# Patient Record
Sex: Male | Born: 1990 | Race: Black or African American | Hispanic: No | Smoking: Never smoker
Health system: Southern US, Community
[De-identification: ages and names within clinical notes are randomized; demographics above are authoritative.]

## PROBLEM LIST (undated history)

## (undated) DIAGNOSIS — I10 Essential (primary) hypertension: Secondary | ICD-10-CM

## (undated) DIAGNOSIS — K219 Gastro-esophageal reflux disease without esophagitis: Secondary | ICD-10-CM

## (undated) HISTORY — PX: TONSILLECTOMY AND ADENOIDECTOMY: SUR1326

## (undated) HISTORY — PX: CHOLECYSTECTOMY: SHX55

---
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
## 2008-04-30 ENCOUNTER — Emergency Department (HOSPITAL_COMMUNITY): Admission: EM | Admit: 2008-04-30 | Discharge: 2008-04-30 | Payer: Self-pay | Admitting: Emergency Medicine

## 2008-05-03 ENCOUNTER — Ambulatory Visit (HOSPITAL_COMMUNITY): Admission: RE | Admit: 2008-05-03 | Discharge: 2008-05-03 | Payer: Self-pay | Admitting: Otolaryngology

## 2010-07-02 ENCOUNTER — Encounter: Payer: Self-pay | Admitting: Orthopedic Surgery

## 2010-07-08 ENCOUNTER — Emergency Department (HOSPITAL_COMMUNITY)
Admission: EM | Admit: 2010-07-08 | Discharge: 2010-07-08 | Payer: Self-pay | Source: Home / Self Care | Admitting: Emergency Medicine

## 2010-07-09 ENCOUNTER — Encounter: Payer: Self-pay | Admitting: Orthopedic Surgery

## 2010-07-09 ENCOUNTER — Ambulatory Visit
Admission: RE | Admit: 2010-07-09 | Discharge: 2010-07-09 | Payer: Self-pay | Source: Home / Self Care | Attending: Orthopedic Surgery | Admitting: Orthopedic Surgery

## 2010-07-09 DIAGNOSIS — S52133A Displaced fracture of neck of unspecified radius, initial encounter for closed fracture: Secondary | ICD-10-CM | POA: Insufficient documentation

## 2010-07-18 NOTE — Letter (Signed)
Summary: History form  History form   Imported By: Jacklynn Ganong 07/11/2010 14:40:39  _____________________________________________________________________  External Attachment:    Type:   Image     Comment:   External Document

## 2010-07-18 NOTE — Assessment & Plan Note (Signed)
Summary: AP ER FOL/UP/FX LT ELBOW/XRAY [facility] 07/08/09/SELF-PAY/ADVISED OF...  Chief complaint LEFT elbow pain.  History date of injury is January 23 of 2012.  Patient fell playing basketball. Also lost consciousness. Evaluation emergency room last night showed no postconcussive symptoms.  No complaints of sharp lateral elbow pain that is constant. Pain with any rotation of the forearm.  Pain is 8/10.  Review of systems is negative, except for the joint pain.  He is allergic to penicillin.  PSH  tonsil surgery tonsillectomies also had nasal surgery. PMH NEG  Primary care physician is [place], Civil engineer, contracting.  Currently, on no medications.  Family history is negative.  Social history she is single.  Does not work at this time.  Has no social habits.  Highest grade completed with the 9th grade.  Physical examination  Normal development, grooming, and hygiene are noted in this 20 year old male, who is oriented x3. Mood and affect are flat.  Gait, station are normal.  LEFT upper extremity is tender and swollen over the lateral elbow. He has pain with attempts to rotate the forearm. Elbow appears stable. Shrink and muscle tone are normal. Skin is intact.  Pulse and temperature are normal. He can open and close his hand move his thumb without difficulty.  He has no lymphadenopathy. On that side has no sensory deficits. Coordination and balance were also normal.  X-rays an x-ray report reveal a nondisplaced radial neck fracture.  Should be able to treat this with splinting. We applied a new posterior, splint. He'll come back in 2 weeks for x-rays and to start range of motion.  He is encouraged to get his pain medication refill, which is Percocet 5 mg. He has 20 tablets  [id number] FR CARE RAD HEAD FRAX [phone]   Signed by Fuller Canada MD on 07/09/2010 at 4:14 PM

## 2010-07-18 NOTE — Miscellaneous (Signed)
  Prescriptions: IBUPROFEN 800 MG TABS (IBUPROFEN) 1 by mouth q 8 hrs prn  #90 x 2   Entered and Authorized by:   Fuller Canada MD   Signed by:   Fuller Canada MD on 07/09/2010   Method used:   Faxed to ...       [facility] [address] [phone]* (retail)       [address] [place], [place]  [address]       Ph: [phone]       Fax: [phone]   RxID:   [id number]  Chief complaint LEFT elbow pain.  History date of injury is January 23 of 2012.  Patient fell playing basketball. Also lost consciousness. Evaluation emergency room last night showed no postconcussive symptoms.  No complaints of sharp lateral elbow pain that is constant. Pain with any rotation of the forearm.  Pain is 8/10.  Review of systems is negative, except for the joint pain.  He is allergic to penicillin.  PSH  tonsil surgery tonsillectomies also had nasal surgery. 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  Currently, on no medications.  Family history is negative.  Social history she is single.  Does not work at this time.  Has no social habits.  Highest grade completed with the 9th grade.  Physical examination  Normal development, grooming, and hygiene are noted in this 20 year old male, who is oriented x3. Mood and affect are flat.  Gait, station are normal.  LEFT upper extremity is tender and swollen over the lateral elbow. He has pain with attempts to rotate the forearm. Elbow appears stable. Shrink and muscle tone are normal. Skin is intact.  Pulse and temperature are normal. He can open and close his hand move his thumb without difficulty.  He has no lymphadenopathy. On that side has no sensory deficits. Coordination and balance were also normal.  X-rays an x-ray report reveal a nondisplaced radial neck fracture.  Should be able to treat this with splinting. We applied a new posterior, splint. He'll come back in 2 weeks for x-rays and to start range  of motion.  He is encouraged to get his pain medication refill, which is Percocet 5 mg. He has 20 tablets  [id number] FR CARE RAD HEAD FRAX [phone]

## 2010-07-23 ENCOUNTER — Encounter: Payer: Self-pay | Admitting: Orthopedic Surgery

## 2010-07-23 ENCOUNTER — Ambulatory Visit (INDEPENDENT_AMBULATORY_CARE_PROVIDER_SITE_OTHER): Payer: Self-pay | Admitting: Orthopedic Surgery

## 2010-07-23 DIAGNOSIS — S52133A Displaced fracture of neck of unspecified radius, initial encounter for closed fracture: Secondary | ICD-10-CM

## 2010-07-24 NOTE — Miscellaneous (Signed)
  Clinical Lists Changes  Orders: Added new Service order of New Patient Level III (99203) - Signed 

## 2010-08-01 NOTE — Assessment & Plan Note (Signed)
Summary: 2 WK RE-CK/XRAY LT ELBOW/SELF PAY VS.M.[facility] DISCOUNT/CAF   Visit Type:  Follow-up  CC:  left elbow fracture.  History of Present Illness: I saw Matthew Alvarez in the office today for a 2 week  followup visit.  He is a 20 years old man with the complaint of:  left elbow fracture  FR CARE RAD HEAD FRAX [phone]  date of injury is January 23 of 2012.  Medications: Ibuprofen 800 mg.  Xrays today.  Complaints: He still has some pain.     Physical Exam  Additional Exam:  currently he has flexion of 120, extension -30  Radiographs AP, lateral, elbow, show healed radial neck   Impression & Recommendations:  Problem # 1:  CLOSED FRACTURE OF NECK OF RADIUS (ICD-813.06) Assessment Improved  Orders: Post-Op Check (47829)  Patient Instructions: 1)  Start doing exercises that we showed you in the office for bending the elbow for  the next 2 weeks 2)  after 2 weeks add exercises of rotation of the hand 3)  come back as needed   Orders Added: 1)  Post-Op Check [99024]  Appended Document: 2 WK RE-CK/XRAY LT ELBOW/SELF PAY VS.M.[facility] DISCOUNT/CAF Separate and Identifiable X-Ray report      LEFT elbow.  Is a noted radial neck fracture has healed in good position.  No joint subluxation is noted.  Impression healed radial neck/head
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
## 2011-03-19 LAB — CBC
HCT: 42.4
Hemoglobin: 14.2
MCV: 84.8
RBC: 5
WBC: 10.7 — ABNORMAL HIGH

## 2012-09-22 ENCOUNTER — Encounter (HOSPITAL_COMMUNITY): Payer: Self-pay

## 2012-09-22 ENCOUNTER — Emergency Department (HOSPITAL_COMMUNITY): Payer: Self-pay

## 2012-09-22 ENCOUNTER — Emergency Department (HOSPITAL_COMMUNITY)
Admission: EM | Admit: 2012-09-22 | Discharge: 2012-09-22 | Disposition: A | Discharge status: Home / Self Care | Payer: Self-pay | Attending: Emergency Medicine | Admitting: Emergency Medicine

## 2012-09-22 DIAGNOSIS — S6390XA Sprain of unspecified part of unspecified wrist and hand, initial encounter: Secondary | ICD-10-CM | POA: Insufficient documentation

## 2012-09-22 DIAGNOSIS — Y9367 Activity, basketball: Secondary | ICD-10-CM | POA: Insufficient documentation

## 2012-09-22 DIAGNOSIS — X500XXA Overexertion from strenuous movement or load, initial encounter: Secondary | ICD-10-CM | POA: Insufficient documentation

## 2012-09-22 DIAGNOSIS — Y92838 Other recreation area as the place of occurrence of the external cause: Secondary | ICD-10-CM | POA: Insufficient documentation

## 2012-09-22 DIAGNOSIS — S63601A Unspecified sprain of right thumb, initial encounter: Secondary | ICD-10-CM

## 2012-09-22 DIAGNOSIS — Y9239 Other specified sports and athletic area as the place of occurrence of the external cause: Secondary | ICD-10-CM | POA: Insufficient documentation

## 2012-09-22 MED ORDER — IBUPROFEN 800 MG PO TABS: 800.0000 mg | ORAL_TABLET | Freq: Three times a day (TID) | ORAL | Status: DC | PRN

## 2012-09-22 MED ORDER — IBUPROFEN 800 MG PO TABS
800.0000 mg | ORAL_TABLET | Freq: Once | ORAL | Status: AC
  Administered 2012-09-22: 800 mg via ORAL
  Filled 2012-09-22: qty 1

## 2012-09-22 NOTE — ED Notes (Signed)
Instructions, prescriptions and f/u information given/reviewed.  Medicated for pain; velcro thumb spica applied Rt thumb; verbalizes understanding of splint care and d/c instructions.

## 2012-09-22 NOTE — ED Notes (Signed)
Playing ball and injured right thumb

## 2012-09-23 NOTE — ED Provider Notes (Signed)
Medical screening examination/treatment/procedure(s) were performed by non-physician practitioner and as supervising physician I was immediately available for consultation/collaboration.   Benny Lennert, MD 09/23/12 [phone]

## 2012-09-23 NOTE — ED Provider Notes (Signed)
History     CSN: [id number]  Arrival date & time 09/22/12  1555   First MD Initiated Contact with Patient 09/22/12 1659      Chief Complaint  Patient presents with  . Finger Injury    (Consider location/radiation/quality/duration/timing/severity/associated sxs/prior treatment) HPI Comments: Matthew Alvarez is a 22 y.o. right-handed Male presenting with pain in his right thumb since it was hyperextended during a basketball game yesterday.  He has been applying ice to the injured area and his swelling is improved but he continues to have pain with any attempts at range of motion.  There is no radiation of pain into his wrist or forearm.  And he has normal sensation in his fingertip.  There is no obvious deformity.  The history is provided by the patient.    History reviewed. No pertinent past medical history.  History reviewed. No pertinent past surgical history.  History reviewed. No pertinent family history.  History  Substance Use Topics  . Smoking status: Not on file  . Smokeless tobacco: Not on file  . Alcohol Use: Yes      Review of Systems  Constitutional: Negative for fever.  Musculoskeletal: Positive for joint swelling and arthralgias. Negative for myalgias.  Skin: Negative for color change and wound.  Neurological: Negative for weakness and numbness.    Allergies  Penicillins  Home Medications   Current Outpatient Rx  Name  Route  Sig  Dispense  Refill  . ibuprofen (ADVIL,MOTRIN) 800 MG tablet   Oral   Take 1 tablet (800 mg total) by mouth every 8 (eight) hours as needed for pain.   15 tablet   0     BP 137/71  Pulse 67  Temp(Src) 98.5 F (36.9 C) (Oral)  Resp 18  Ht 5\' 9"  (1.753 m)  Wt 198 lb 1 oz (89.841 kg)  BMI 29.24 kg/m2  SpO2 99%  Physical Exam  Constitutional: He appears well-developed and well-nourished.  HENT:  Head: Atraumatic.  Neck: Normal range of motion.  Cardiovascular:  Pulses equal bilaterally  Musculoskeletal: He  exhibits tenderness.  Patient is tender to palpation in his right thumb at the MCP joint which extends into his thenar eminence.  There is no obvious deformity, mild edema without erythema or ecchymosis.  He is less than 3 seconds distal capillary refill and his distal sensation is intact in the thumb.  MCP and PIP joints are without laxity.  Neurological: He is alert. He has normal strength. He displays normal reflexes. No sensory deficit.  Equal strength  Skin: Skin is warm and dry.  Psychiatric: He has a normal mood and affect.    ED Course  Procedures (including critical care time)  Labs Reviewed - No data to display Dg Finger Thumb Right  09/22/2012  *RADIOLOGY REPORT*  Clinical Data: Thumb injury 1 day prior  RIGHT THUMB 2+V  Comparison: None.  Findings:  No definite fracture or dislocation.  Joint spaces are preserved. No definite erosions.  Regional soft tissues are normal.  No radiopaque foreign body.  IMPRESSION: No fracture or dislocation.   Original Report Authenticated By: Tacey Ruiz, MD      1. Thumb sprain, right, initial encounter       MDM  Patients labs and/or radiological studies were viewed and considered during the medical decision making and disposition process.  Patient was placed in a thumb spica splint for comfort.  Encouraged to stop using the splint once his symptoms are better which I suspect  should occur over the next several days.  He was encouraged to continue using ice and elevation of the finger and is also prescribed ibuprofen 800 mg tablets.  He was given a referral to Dr. Romeo Apple when necessary if his symptoms are not improved over the next week.        Burgess Amor, PA-C 09/23/12 1617

## 2013-02-20 ENCOUNTER — Encounter (HOSPITAL_COMMUNITY): Payer: Self-pay

## 2013-02-20 ENCOUNTER — Emergency Department (HOSPITAL_COMMUNITY): Payer: Self-pay

## 2013-02-20 ENCOUNTER — Emergency Department (HOSPITAL_COMMUNITY)
Admission: EM | Admit: 2013-02-20 | Discharge: 2013-02-20 | Disposition: A | Discharge status: Home / Self Care | Payer: Self-pay | Attending: Emergency Medicine | Admitting: Emergency Medicine

## 2013-02-20 DIAGNOSIS — Y9367 Activity, basketball: Secondary | ICD-10-CM | POA: Insufficient documentation

## 2013-02-20 DIAGNOSIS — Y9239 Other specified sports and athletic area as the place of occurrence of the external cause: Secondary | ICD-10-CM | POA: Insufficient documentation

## 2013-02-20 DIAGNOSIS — S6390XA Sprain of unspecified part of unspecified wrist and hand, initial encounter: Secondary | ICD-10-CM | POA: Insufficient documentation

## 2013-02-20 DIAGNOSIS — Z880 Allergy status to penicillin: Secondary | ICD-10-CM | POA: Insufficient documentation

## 2013-02-20 DIAGNOSIS — S6391XA Sprain of unspecified part of right wrist and hand, initial encounter: Secondary | ICD-10-CM

## 2013-02-20 DIAGNOSIS — R209 Unspecified disturbances of skin sensation: Secondary | ICD-10-CM | POA: Insufficient documentation

## 2013-02-20 DIAGNOSIS — R296 Repeated falls: Secondary | ICD-10-CM | POA: Insufficient documentation

## 2013-02-20 MED ORDER — IBUPROFEN 800 MG PO TABS: 800.0000 mg | ORAL_TABLET | Freq: Three times a day (TID) | ORAL | Status: DC

## 2013-02-20 NOTE — ED Notes (Signed)
Pt reports injuring right hand 2 weeks ago while playing basketball. Cont. To have pain, no swelling or deformity noted.

## 2013-02-20 NOTE — ED Provider Notes (Signed)
CSN: [id number]     Arrival date & time 02/20/13  1729 History   First MD Initiated Contact with Patient 02/20/13 1755     Chief Complaint  Patient presents with  . Hand Pain   (Consider location/radiation/quality/duration/timing/severity/associated sxs/prior Treatment) HPI Comments: TRAYVEON BECKFORD is a 22 y.o. male who presents to the Emergency Department complaining of pain to his right lateral hand for 2 weeks. States pain began he fell on outstretched hand. States the pain is worse with palmar flexion and when someone shakes his hand. He also reports intermittent tingling to his fingers. He denies redness, swelling, numbness, wrist or elbow pain or pain to the medial side of his hand.  Patient is a 22 y.o. male presenting with hand pain. The history is provided by the patient.  Hand Pain This is a new problem. Episode onset: 2 weeks ago. The problem occurs constantly. The problem has been unchanged. Associated symptoms include arthralgias. Pertinent negatives include no chills, fever, headaches, joint swelling, myalgias, neck pain, numbness, rash, visual change or weakness. Exacerbated by: Movement and" shaking someone's hand" He has tried nothing for the symptoms. The treatment provided no relief.    History reviewed. No pertinent past medical history. History reviewed. No pertinent past surgical history. No family history on file. History  Substance Use Topics  . Smoking status: Never Smoker   . Smokeless tobacco: Not on file  . Alcohol Use: Yes    Review of Systems  Constitutional: Negative for fever and chills.  HENT: Negative for neck pain.   Genitourinary: Negative for dysuria and difficulty urinating.  Musculoskeletal: Positive for arthralgias. Negative for myalgias and joint swelling.  Skin: Negative for color change, rash and wound.  Neurological: Negative for weakness, numbness and headaches.  All other systems reviewed and are negative.    Allergies   Penicillins  Home Medications   Current Outpatient Rx  Name  Route  Sig  Dispense  Refill  . ibuprofen (ADVIL,MOTRIN) 800 MG tablet   Oral   Take 1 tablet (800 mg total) by mouth every 8 (eight) hours as needed for pain.   15 tablet   0   . ibuprofen (ADVIL,MOTRIN) 800 MG tablet   Oral   Take 1 tablet (800 mg total) by mouth 3 (three) times daily.   21 tablet   0    BP 134/51  Pulse 51  Temp(Src) 97.8 F (36.6 C) (Oral)  Resp 18  Ht 5\' 9"  (1.753 m)  Wt 196 lb 2 oz (88.962 kg)  BMI 28.95 kg/m2  SpO2 100% Physical Exam  Nursing note and vitals reviewed. Constitutional: He is oriented to person, place, and time. He appears well-developed and well-nourished. No distress.  HENT:  Head: Normocephalic and atraumatic.  Cardiovascular: Normal rate, regular rhythm and normal heart sounds.   No murmur heard. Pulmonary/Chest: Effort normal and breath sounds normal. No respiratory distress.  Musculoskeletal: He exhibits tenderness. He exhibits no edema.       Right hand: He exhibits tenderness. He exhibits normal range of motion, no bony tenderness, normal two-point discrimination, normal capillary refill, no deformity, no laceration and no swelling. Normal sensation noted. Normal strength noted.       Hands: Tenderness to palpation of the lateral right hand. Wrist is nontender. Patient has full range of motion of the fingers and wrist. Radial pulse is brisk, distal sensation intact.  CR< 2 sec.  No bruising, edema or bony deformity.  Patient has full ROM.   Neurological:  He is alert and oriented to person, place, and time. He exhibits normal muscle tone. Coordination normal.  Skin: Skin is warm and dry.    ED Course  Procedures (including critical care time) Labs Review Labs Reviewed - No data to display Imaging Review Dg Hand Complete Right  02/20/2013   *RADIOLOGY REPORT*  Clinical Data: Fifth metatarsal pain, fall  RIGHT HAND - COMPLETE 3+ VIEW  Comparison: None.  Findings:  There is no fracture or dislocation of the right carpal bones, metacarpals, or phalanges.  No soft tissue abnormality.  IMPRESSION: No fracture or dislocation.   Original Report Authenticated By: Genevive Bi, M.D.    MDM   1. Hand sprain, right, initial encounter    Velcro wrist splint applied, pain improved, remains NV intact.    Patient agrees to elevate ice and wear splint as directed. Referral information given for Dr. Hilda Lias for followup. X-ray findings were discussed. Patient appears stable for discharge at this time.    Irvine Glorioso L. Trisha Mangle, PA-C 02/20/13 1856

## 2013-02-22 NOTE — ED Provider Notes (Signed)
Medical screening examination/treatment/procedure(s) were performed by non-physician practitioner and as supervising physician I was immediately available for consultation/collaboration.   Laray Anger, DO 02/22/13 2151

## 2013-07-10 ENCOUNTER — Emergency Department (HOSPITAL_COMMUNITY): Payer: Self-pay

## 2013-07-10 ENCOUNTER — Encounter (HOSPITAL_COMMUNITY): Payer: Self-pay | Admitting: Emergency Medicine

## 2013-07-10 ENCOUNTER — Emergency Department (HOSPITAL_COMMUNITY)
Admission: EM | Admit: 2013-07-10 | Discharge: 2013-07-10 | Disposition: A | Discharge status: Home / Self Care | Payer: Self-pay | Attending: Emergency Medicine | Admitting: Emergency Medicine

## 2013-07-10 DIAGNOSIS — R059 Cough, unspecified: Secondary | ICD-10-CM | POA: Insufficient documentation

## 2013-07-10 DIAGNOSIS — I10 Essential (primary) hypertension: Secondary | ICD-10-CM | POA: Insufficient documentation

## 2013-07-10 DIAGNOSIS — Z880 Allergy status to penicillin: Secondary | ICD-10-CM | POA: Insufficient documentation

## 2013-07-10 DIAGNOSIS — R05 Cough: Secondary | ICD-10-CM | POA: Insufficient documentation

## 2013-07-10 DIAGNOSIS — E876 Hypokalemia: Secondary | ICD-10-CM | POA: Insufficient documentation

## 2013-07-10 HISTORY — DX: Essential (primary) hypertension: I10

## 2013-07-10 LAB — CBC WITH DIFFERENTIAL/PLATELET
BASOS PCT: 0 % (ref 0–1)
Basophils Absolute: 0 10*3/uL (ref 0.0–0.1)
Eosinophils Absolute: 0.2 10*3/uL (ref 0.0–0.7)
Eosinophils Relative: 3 % (ref 0–5)
HCT: 41.3 % (ref 39.0–52.0)
HEMOGLOBIN: 13.7 g/dL (ref 13.0–17.0)
LYMPHS ABS: 3.7 10*3/uL (ref 0.7–4.0)
Lymphocytes Relative: 43 % (ref 12–46)
MCH: 27.7 pg (ref 26.0–34.0)
MCHC: 33.2 g/dL (ref 30.0–36.0)
MCV: 83.4 fL (ref 78.0–100.0)
MONOS PCT: 6 % (ref 3–12)
Monocytes Absolute: 0.6 10*3/uL (ref 0.1–1.0)
NEUTROS ABS: 4.1 10*3/uL (ref 1.7–7.7)
NEUTROS PCT: 47 % (ref 43–77)
PLATELETS: 281 10*3/uL (ref 150–400)
RBC: 4.95 MIL/uL (ref 4.22–5.81)
RDW: 12.8 % (ref 11.5–15.5)
WBC: 8.6 10*3/uL (ref 4.0–10.5)

## 2013-07-10 LAB — COMPREHENSIVE METABOLIC PANEL
ALBUMIN: 3.8 g/dL (ref 3.5–5.2)
ALK PHOS: 120 U/L — AB (ref 39–117)
ALT: 18 U/L (ref 0–53)
AST: 26 U/L (ref 0–37)
BILIRUBIN TOTAL: 0.2 mg/dL — AB (ref 0.3–1.2)
BUN: 15 mg/dL (ref 6–23)
CHLORIDE: 97 meq/L (ref 96–112)
CO2: 27 mEq/L (ref 19–32)
Calcium: 9.4 mg/dL (ref 8.4–10.5)
Creatinine, Ser: 0.98 mg/dL (ref 0.50–1.35)
GFR calc Af Amer: >90 mL/min (ref >90)
GFR calc non Af Amer: >90 mL/min (ref >90)
Glucose, Bld: 89 mg/dL (ref 70–99)
POTASSIUM: 3.3 meq/L — AB (ref 3.7–5.3)
SODIUM: 136 meq/L — AB (ref 137–147)
TOTAL PROTEIN: 8 g/dL (ref 6.0–8.3)

## 2013-07-10 LAB — TROPONIN I

## 2013-07-10 MED ORDER — POTASSIUM CHLORIDE CRYS ER 10 MEQ PO TBCR: 20.0000 meq | EXTENDED_RELEASE_TABLET | Freq: Every day | ORAL | Status: DC

## 2013-07-10 MED ORDER — POTASSIUM CHLORIDE CRYS ER 20 MEQ PO TBCR
40.0000 meq | EXTENDED_RELEASE_TABLET | Freq: Once | ORAL | Status: AC
  Administered 2013-07-10: 40 meq via ORAL
  Filled 2013-07-10: qty 2

## 2013-07-10 NOTE — ED Notes (Signed)
Pt c/o muscle "cramps" that started this am, states that the cramps will happen at different times involving both arms, hip and leg area. Pt denies n/v/d, pt also reports that he has been having intermittent chest pains for "months", has [facility] for the pain,

## 2013-07-10 NOTE — Discharge Instructions (Signed)
Follow up with your md in one week 

## 2013-07-10 NOTE — ED Provider Notes (Signed)
CSN: [id number]     Arrival date & time 07/10/13  1344 History   First MD Initiated Contact with Patient 07/10/13 1626     This chart was scribed for Benny LennertJoseph L Elyon Zoll, MD by Arlan OrganAshley Leger, ED Scribe. This patient was seen in room APA10/APA10 and the patient's care was started 4:31 PM.   Chief Complaint  Patient presents with  . Spasms  . Chest Pain   Patient is a 23 y.o. male presenting with chest pain. The history is provided by the patient. No language interpreter was used.  Chest Pain Pain radiates to:  Does not radiate Pain radiates to the back: no   Pain severity:  Mild Onset quality:  Gradual Timing:  Intermittent Relieved by:  None tried Worsened by:  Nothing tried Ineffective treatments:  None tried Associated symptoms: cough     HPI Comments: Matthew Alvarez is a 23 y.o. male who presents to the Emergency Department complaining of sudden onset, ongoing, intermittent bilateral muscle spasms to the arms that initially started early this morning. He states movement does not worsen his pain. He has also noted intermittent chest pain that he states started "months" ago. He reports being evaluated at the health department for his pain. Pt admits to a productive cough consisting of green sputum onset 2 days. Denies any aggravating or alleviating factors. He denies any fever or SOB at this time. He denies currently being a smoker.  He is followed by Dr. Elfredia NevinsLawrence Fusco  Past Medical History  Diagnosis Date  . Hypertension    Past Surgical History  Procedure Laterality Date  . Tonsillectomy and adenoidectomy     No family history on file. History  Substance Use Topics  . Smoking status: Never Smoker   . Smokeless tobacco: Not on file  . Alcohol Use: Yes    Review of Systems  Respiratory: Positive for cough.   Cardiovascular: Positive for chest pain.  Musculoskeletal:       Muscle spasms to the arms bilaterally  All other systems reviewed and are negative.    Allergies   Penicillins  Home Medications  No current outpatient prescriptions on file.  Triage Vitals: BP 132/49  Pulse 52  Temp(Src) 97.8 F (36.6 C) (Oral)  Resp 20  SpO2 100%  Physical Exam  Nursing note and vitals reviewed. Constitutional: He is oriented to person, place, and time. He appears well-developed and well-nourished.  HENT:  Head: Normocephalic.  Eyes: EOM are normal.  Neck: Normal range of motion.  Cardiovascular: Normal rate and regular rhythm.   Pulmonary/Chest: Effort normal.  Abdominal: He exhibits no distension.  Musculoskeletal: Normal range of motion.  Neurological: He is alert and oriented to person, place, and time.  Psychiatric: He has a normal mood and affect.    ED Course  Procedures (including critical care time)  DIAGNOSTIC STUDIES: Oxygen Saturation is 100% on RA, Normal by my interpretation.    COORDINATION OF CARE: 4:29 PM- Will order chest X-Ray, EKG, troponin I, and blood panel. Discussed treatment plan with pt at bedside and pt agreed to plan.     6:27 PM- Discussed low potassium results from blood panel results. Advised pt to take in more potassium daily to avoid deficiency.  Labs Review Labs Reviewed - No data to display Imaging Review No results found.  EKG Interpretation   None       MDM  The chart was scribed for me under my direct supervision.  I personally performed the history, physical,  and medical decision making and all procedures in the evaluation of this patient.Benny Lennert, MD 07/10/13 [phone]

## 2013-12-12 ENCOUNTER — Encounter (HOSPITAL_COMMUNITY): Payer: Self-pay | Admitting: Emergency Medicine

## 2013-12-12 ENCOUNTER — Emergency Department (HOSPITAL_COMMUNITY): Payer: Self-pay

## 2013-12-12 ENCOUNTER — Emergency Department (HOSPITAL_COMMUNITY)
Admission: EM | Admit: 2013-12-12 | Discharge: 2013-12-13 | Disposition: A | Discharge status: Home / Self Care | Payer: Self-pay | Attending: Emergency Medicine | Admitting: Emergency Medicine

## 2013-12-12 DIAGNOSIS — R0789 Other chest pain: Secondary | ICD-10-CM | POA: Insufficient documentation

## 2013-12-12 DIAGNOSIS — Z79899 Other long term (current) drug therapy: Secondary | ICD-10-CM | POA: Insufficient documentation

## 2013-12-12 DIAGNOSIS — Z880 Allergy status to penicillin: Secondary | ICD-10-CM | POA: Insufficient documentation

## 2013-12-12 DIAGNOSIS — Z7982 Long term (current) use of aspirin: Secondary | ICD-10-CM | POA: Insufficient documentation

## 2013-12-12 DIAGNOSIS — I10 Essential (primary) hypertension: Secondary | ICD-10-CM | POA: Insufficient documentation

## 2013-12-12 LAB — CBC WITH DIFFERENTIAL/PLATELET
Basophils Absolute: 0 10*3/uL (ref 0.0–0.1)
Basophils Relative: 0 % (ref 0–1)
Eosinophils Absolute: 0.2 10*3/uL (ref 0.0–0.7)
Eosinophils Relative: 3 % (ref 0–5)
HCT: 40.8 % (ref 39.0–52.0)
Hemoglobin: 14.1 g/dL (ref 13.0–17.0)
Lymphocytes Relative: 51 % — ABNORMAL HIGH (ref 12–46)
Lymphs Abs: 4.8 10*3/uL — ABNORMAL HIGH (ref 0.7–4.0)
MCH: 28.4 pg (ref 26.0–34.0)
MCHC: 34.6 g/dL (ref 30.0–36.0)
MCV: 82.3 fL (ref 78.0–100.0)
Monocytes Absolute: 0.5 10*3/uL (ref 0.1–1.0)
Monocytes Relative: 6 % (ref 3–12)
Neutro Abs: 3.6 10*3/uL (ref 1.7–7.7)
Neutrophils Relative %: 40 % — ABNORMAL LOW (ref 43–77)
Platelets: 261 10*3/uL (ref 150–400)
RBC: 4.96 MIL/uL (ref 4.22–5.81)
RDW: 13.3 % (ref 11.5–15.5)
WBC: 9.2 10*3/uL (ref 4.0–10.5)

## 2013-12-12 LAB — BASIC METABOLIC PANEL
BUN: 10 mg/dL (ref 6–23)
CO2: 32 mEq/L (ref 19–32)
Calcium: 9 mg/dL (ref 8.4–10.5)
Chloride: 96 mEq/L (ref 96–112)
Creatinine, Ser: 1.12 mg/dL (ref 0.50–1.35)
GFR calc Af Amer: >90 mL/min (ref >90)
GFR calc non Af Amer: >90 mL/min (ref >90)
Glucose, Bld: 86 mg/dL (ref 70–99)
Potassium: 3.2 mEq/L — ABNORMAL LOW (ref 3.7–5.3)
Sodium: 138 mEq/L (ref 137–147)

## 2013-12-12 MED ORDER — POTASSIUM CHLORIDE CRYS ER 20 MEQ PO TBCR
40.0000 meq | EXTENDED_RELEASE_TABLET | Freq: Once | ORAL | Status: AC
  Administered 2013-12-13: 40 meq via ORAL
  Filled 2013-12-12: qty 2

## 2013-12-12 NOTE — Discharge Instructions (Signed)

## 2013-12-12 NOTE — ED Notes (Signed)
Performed EKG upon arrival to room 14; handed to Dr Rosalia Hammersay

## 2013-12-12 NOTE — ED Notes (Signed)
Feels sob for 3 days, no cough,  No fever or chills.    No sore throat., "pressure on my chest"

## 2013-12-12 NOTE — ED Provider Notes (Signed)
CSN: [id number]     Arrival date & time 12/12/13  2151 History  This chart was scribed for Raeford RazorStephen Kohut, MD,  by Ashley JacobsBrittany Andrews, ED Scribe. The patient was seen in room APA14/APA14 and the patient's care was started at 11:07 PM.   First MD Initiated Contact with Patient 12/12/13 2302     Chief Complaint  Patient presents with  . Shortness of Breath     (Consider location/radiation/quality/duration/timing/severity/associated sxs/prior Treatment) Patient is a 23 y.o. male presenting with shortness of breath. The history is provided by the patient and medical records. No language interpreter was used.  Shortness of Breath Severity:  Mild Onset quality:  Sudden Duration:  3 days Timing:  Intermittent Progression:  Unchanged Worsened by:  Nothing tried Ineffective treatments:  None tried Associated symptoms: no abdominal pain, no cough, no fever and no vomiting    HPI Comments: Matthew Alvarez is a 23 y.o. male with ,hx of HTN,who presents to the Emergency Department complaining of intermittent chest tightness for the past three days that remains unchanged. Nothing causes the pain and it comes as sudden,  spontaneous onset.  The tightness feels like pressure.  He has associated SOB and explains it as a "fist in my chest". The chest tightness is better with walking and the tightness returns when he sits. He had a similar sensation when his potassium was low. Denies nausea, abdominal pain, and vomiting. Denies sore throat, fever and chills.  Denies leg swelling. Denies visual changes.   Past Medical History  Diagnosis Date  . Hypertension    Past Surgical History  Procedure Laterality Date  . Tonsillectomy and adenoidectomy     History reviewed. No pertinent family history. History  Substance Use Topics  . Smoking status: Never Smoker   . Smokeless tobacco: Not on file  . Alcohol Use: Yes    Review of Systems  Constitutional: Negative for fever and chills.  Eyes: Negative for  visual disturbance.  Respiratory: Positive for chest tightness and shortness of breath. Negative for cough.   Gastrointestinal: Negative for nausea, vomiting and abdominal pain.  All other systems reviewed and are negative.     Allergies  Penicillins  Home Medications   Prior to Admission medications   Medication Sig Start Date End Date Taking? Authorizing Provider  ASPIRIN PO Take 1 tablet by mouth once as needed (for relief).   Yes Historical Provider, MD  aspirin-sod bicarb-citric acid (ALKA-SELTZER) 325 MG TBEF tablet Take 650 mg by mouth once as needed (for relief).   Yes Historical Provider, MD  Bisacodyl (LAXATIVE PO) Take 1-2 tablets by mouth once as needed (for relief).   Yes Historical Provider, MD  hydrochlorothiazide (HYDRODIURIL) 25 MG tablet Take 25 mg by mouth daily.   Yes Historical Provider, MD  potassium chloride SA (K-DUR,KLOR-CON) 10 MEQ tablet Take 2 tablets (20 mEq total) by mouth daily. 07/10/13  Yes Benny LennertJoseph L Zammit, MD   BP 130/72  Pulse 56  Temp(Src) 98.4 F (36.9 C) (Oral)  Resp 17  Ht 5\' 9"  (1.753 m)  Wt 198 lb (89.812 kg)  BMI 29.23 kg/m2  SpO2 100% Physical Exam  Nursing note and vitals reviewed. Constitutional: He is oriented to person, place, and time. He appears well-developed and well-nourished.  HENT:  Head: Normocephalic.  Eyes: Pupils are equal, round, and reactive to light.  Neck: Normal range of motion.  Cardiovascular: Normal rate.   Pulmonary/Chest: Effort normal and breath sounds normal. He has no wheezes. He has no  rales.  Abdominal: Soft. Bowel sounds are normal. He exhibits no distension. There is no tenderness.  Musculoskeletal: Normal range of motion. He exhibits no edema.  No calf tenderness bilaterally    Neurological: He is alert and oriented to person, place, and time.  Skin: Skin is warm and dry. He is not diaphoretic.  Psychiatric: He has a normal mood and affect. His behavior is normal.    ED Course  Procedures  (including critical care time) DIAGNOSTIC STUDIES: Oxygen Saturation is 100% on room air, normal by my interpretation.    COORDINATION OF CARE:  11:10 PM Discussed course of care with pt which includes EKG, chest x-ray and laboratory test. Pt understands and agrees.   Labs Review Labs Reviewed  BASIC METABOLIC PANEL - Abnormal; Notable for the following:    Potassium 3.2 (*)    All other components within normal limits  CBC WITH DIFFERENTIAL - Abnormal; Notable for the following:    Neutrophils Relative % 40 (*)    Lymphocytes Relative 51 (*)    Lymphs Abs 4.8 (*)    All other components within normal limits    Imaging Review Dg Chest 2 View  12/12/2013   CLINICAL DATA:  Chest pain  EXAM: CHEST  2 VIEW  COMPARISON:  None  FINDINGS: The heart size and mediastinal contours are within normal limits. Both lungs are clear. The visualized skeletal structures are unremarkable.  IMPRESSION: No active cardiopulmonary disease.   Electronically Signed   By: Signa Kellaylor  Stroud M.D.   On: 12/12/2013 23:19     EKG Interpretation   Date/Time:  Monday December 12 2013 22:05:10 EDT Ventricular Rate:  51 PR Interval:  201 QRS Duration: 97 QT Interval:  408 QTC Calculation: 376 R Axis:   46 Text Interpretation:  Sinus rhythm RSR' in V1 or V2, probably normal  variant No significant change since last tracing Confirmed by KOHUT  MD,  STEPHEN ([id number]) on 12/14/2013 7:22:35 AM      EKG:  Rhythm: sinus bradycardia Vent. rate 51 BPM PR interval 201 ms QRS duration 97 ms QT/QTc 408/376 ms ST segments: NS ST changes   MDM   Final diagnoses:  Chest tightness or pressure    22yM with CP. HD stable. Lungs clear. No increased WOB. CXR clear. Low suspicion for ACS, PE, infectious, dissection or other serious pathology.  I personally preformed the services scribed in my presence. The recorded information has been reviewed is accurate. Raeford RazorStephen Kohut, MD.     Raeford RazorStephen Kohut, MD 12/14/13 [phone]

## 2014-01-05 ENCOUNTER — Emergency Department (HOSPITAL_COMMUNITY)
Admission: EM | Admit: 2014-01-05 | Discharge: 2014-01-05 | Disposition: A | Discharge status: Home / Self Care | Payer: Self-pay | Attending: Emergency Medicine | Admitting: Emergency Medicine

## 2014-01-05 ENCOUNTER — Encounter (HOSPITAL_COMMUNITY): Payer: Self-pay | Admitting: Emergency Medicine

## 2014-01-05 ENCOUNTER — Emergency Department (HOSPITAL_COMMUNITY): Payer: Self-pay

## 2014-01-05 DIAGNOSIS — X500XXA Overexertion from strenuous movement or load, initial encounter: Secondary | ICD-10-CM | POA: Insufficient documentation

## 2014-01-05 DIAGNOSIS — Z880 Allergy status to penicillin: Secondary | ICD-10-CM | POA: Insufficient documentation

## 2014-01-05 DIAGNOSIS — I10 Essential (primary) hypertension: Secondary | ICD-10-CM | POA: Insufficient documentation

## 2014-01-05 DIAGNOSIS — Y92838 Other recreation area as the place of occurrence of the external cause: Secondary | ICD-10-CM

## 2014-01-05 DIAGNOSIS — Y9367 Activity, basketball: Secondary | ICD-10-CM | POA: Insufficient documentation

## 2014-01-05 DIAGNOSIS — Y9239 Other specified sports and athletic area as the place of occurrence of the external cause: Secondary | ICD-10-CM | POA: Insufficient documentation

## 2014-01-05 DIAGNOSIS — S93402A Sprain of unspecified ligament of left ankle, initial encounter: Secondary | ICD-10-CM

## 2014-01-05 DIAGNOSIS — S93409A Sprain of unspecified ligament of unspecified ankle, initial encounter: Secondary | ICD-10-CM | POA: Insufficient documentation

## 2014-01-05 MED ORDER — HYDROCODONE-ACETAMINOPHEN 5-325 MG PO TABS: ORAL_TABLET | ORAL | Status: DC

## 2014-01-05 MED ORDER — IBUPROFEN 800 MG PO TABS: 800.0000 mg | ORAL_TABLET | Freq: Three times a day (TID) | ORAL | Status: DC

## 2014-01-05 MED ORDER — HYDROCODONE-ACETAMINOPHEN 5-325 MG PO TABS
1.0000 | ORAL_TABLET | Freq: Once | ORAL | Status: AC
  Administered 2014-01-05: 1 via ORAL
  Filled 2014-01-05: qty 1

## 2014-01-05 MED ORDER — IBUPROFEN 800 MG PO TABS
800.0000 mg | ORAL_TABLET | Freq: Once | ORAL | Status: AC
  Administered 2014-01-05: 800 mg via ORAL
  Filled 2014-01-05: qty 1

## 2014-01-05 NOTE — ED Provider Notes (Signed)
CSN: [id number]     Arrival date & time 01/05/14  2028 History   First MD Initiated Contact with Patient 01/05/14 2044     Chief Complaint  Patient presents with  . Ankle Injury     (Consider location/radiation/quality/duration/timing/severity/associated sxs/prior Treatment)  Aubry L Somerville is a 23 y.o. male who presents to the Emergency Department complaining of left ankle pain and swelling after a twisting injury while playing basketball.  He reports immediate swelling to the lateral ankle.  Pain is worse with weight bearing and improves with rest.  He denies injury above the ankle.  He has not tried any therapies or medication prior to ED arrival.    Patient is a 23 y.o. male presenting with lower extremity injury. The history is provided by the patient.  Ankle Injury This is a new problem. The current episode started today. The problem occurs constantly. The problem has been unchanged. Associated symptoms include arthralgias and joint swelling. Pertinent negatives include no chills, fever, neck pain, numbness, rash, vomiting or weakness. The symptoms are aggravated by bending, standing and walking. He has tried nothing for the symptoms. The treatment provided no relief.    Past Medical History  Diagnosis Date  . Hypertension    Past Surgical History  Procedure Laterality Date  . Tonsillectomy and adenoidectomy     History reviewed. No pertinent family history. History  Substance Use Topics  . Smoking status: Never Smoker   . Smokeless tobacco: Not on file  . Alcohol Use: No    Review of Systems  Constitutional: Negative for fever and chills.  Gastrointestinal: Negative for vomiting.  Genitourinary: Negative for dysuria and difficulty urinating.  Musculoskeletal: Positive for arthralgias and joint swelling. Negative for back pain and neck pain.  Skin: Negative for color change, rash and wound.  Neurological: Negative for weakness and numbness.  All other systems reviewed  and are negative.     Allergies  Penicillins  Home Medications   Prior to Admission medications   Medication Sig Start Date End Date Taking? Authorizing Provider  HYDROcodone-acetaminophen (NORCO/VICODIN) 5-325 MG per tablet Take one-two tabs po q 4-6 hrs prn pain 01/05/14   Selso Mannor L. Saadiq Poche, PA-C  ibuprofen (ADVIL,MOTRIN) 800 MG tablet Take 1 tablet (800 mg total) by mouth 3 (three) times daily. 01/05/14   Wen Munford L. Sheletha Bow, PA-C   BP 133/62  Pulse 69  Temp(Src) 98 F (36.7 C) (Oral)  Resp 18  Ht 5\' 9"  (1.753 m)  Wt 204 lb (92.534 kg)  BMI 30.11 kg/m2  SpO2 100% Physical Exam  Nursing note and vitals reviewed. Constitutional: He is oriented to person, place, and time. He appears well-developed and well-nourished. No distress.  HENT:  Head: Normocephalic and atraumatic.  Cardiovascular: Normal rate, regular rhythm, normal heart sounds and intact distal pulses.   No murmur heard. Pulmonary/Chest: Effort normal and breath sounds normal. No respiratory distress.  Musculoskeletal: He exhibits edema and tenderness.  Left lateral ankle is ttp, moderate STS is present.  ROM is preserved.  DP pulse is brisk,distal sensation intact.  No erythema, abrasion, bruising or bony deformity.  No proximal tenderness.  Neurological: He is alert and oriented to person, place, and time. He exhibits normal muscle tone. Coordination normal.  Skin: Skin is warm and dry.    ED Course  Procedures (including critical care time) Labs Review Labs Reviewed - No data to display  Imaging Review Dg Ankle Complete Left  01/05/2014   CLINICAL DATA:  Ankle pain and swelling  following injury.  EXAM: LEFT ANKLE COMPLETE - 3+ VIEW  COMPARISON:  None.  FINDINGS: The mineralization and alignment are normal. There is no evidence of acute fracture or dislocation. The joint spaces are preserved. There is prominent anterolateral soft tissue swelling without apparent large ankle joint effusion.  IMPRESSION: No acute  osseous findings.  Anterolateral soft tissue swelling.   Electronically Signed   By: Roxy HorsemanBill  Veazey M.D.   On: 01/05/2014 21:06     EKG Interpretation None      MDM   Final diagnoses:  Ankle sprain, left, initial encounter    Pt is well appearing.  XR results discussed.  He agrees to symptomatic treatment with RICE therapy, ibuprofen and vicodin for pain. I have advised him of possible occult fx given the degree of swelling to the joint and he agrees to arrange orthopedic follow-up in one week if the symptoms are not improving.   ASO splint applied, crutches given.  Pain improved, remains NV intact   Amonie Wisser L. Trisha Mangleriplett, PA-C 01/05/14 2207

## 2014-01-05 NOTE — ED Notes (Signed)
Patient states he was playing ball and twisted his left ankle. Complaining of pain in left ankle.

## 2014-01-05 NOTE — ED Notes (Signed)
Pt given crutches and instructed on how to use; pt able to demonstrate correct way to use without difficulty

## 2014-01-05 NOTE — Discharge Instructions (Signed)
Ankle Sprain  An ankle sprain is an injury to the strong, fibrous tissues (ligaments) that hold your ankle bones together.   HOME CARE   · Put ice on your ankle for 1-2 days or as told by your doctor.  ¨ Put ice in a plastic bag.  ¨ Place a towel between your skin and the bag.  ¨ Leave the ice on for 15-20 minutes at a time, every 2 hours while you are awake.  · Only take medicine as told by your doctor.  · Raise (elevate) your injured ankle above the level of your heart as much as possible for 2-3 days.  · Use crutches if your doctor tells you to. Slowly put your own weight on the affected ankle. Use the crutches until you can walk without pain.  · If you have a plaster splint:  ¨ Do not rest it on anything harder than a pillow for 24 hours.  ¨ Do not put weight on it.  ¨ Do not get it wet.  ¨ Take it off to shower or bathe.  · If given, use an elastic wrap or support stocking for support. Take the wrap off if your toes lose feeling (numb), tingle, or turn cold or blue.  · If you have an air splint:  ¨ Add or let out air to make it comfortable.  ¨ Take it off at night and to shower and bathe.  ¨ Wiggle your toes and move your ankle up and down often while you are wearing it.  GET HELP IF:  · You have rapidly increasing bruising or puffiness (swelling).  · Your toes feel very cold.  · You lose feeling in your foot.  · Your medicine does not help your pain.  GET HELP RIGHT AWAY IF:   · Your toes lose feeling (numb) or turn blue.  · You have severe pain that is increasing.  MAKE SURE YOU:   · Understand these instructions.  · Will watch your condition.  · Will get help right away if you are not doing well or get worse.  Document Released: 11/19/2007 Document Revised: 10/17/2013 Document Reviewed: 12/15/2011  ExitCare® Patient Information ©2015 [employer]. This information is not intended to replace advice given to you by your health care provider. Make sure you discuss any questions you have with your health care  provider.

## 2014-01-07 NOTE — ED Provider Notes (Signed)
Medical screening examination/treatment/procedure(s) were performed by non-physician practitioner and as supervising physician I was immediately available for consultation/collaboration.   EKG Interpretation None       Cypress Hinkson, MD 01/07/14 0704 

## 2014-03-29 ENCOUNTER — Emergency Department (HOSPITAL_COMMUNITY)
Admission: EM | Admit: 2014-03-29 | Discharge: 2014-03-29 | Disposition: A | Discharge status: Home / Self Care | Payer: Self-pay | Attending: Emergency Medicine | Admitting: Emergency Medicine

## 2014-03-29 ENCOUNTER — Encounter (HOSPITAL_COMMUNITY): Payer: Self-pay | Admitting: Emergency Medicine

## 2014-03-29 DIAGNOSIS — Z792 Long term (current) use of antibiotics: Secondary | ICD-10-CM | POA: Insufficient documentation

## 2014-03-29 DIAGNOSIS — L03115 Cellulitis of right lower limb: Secondary | ICD-10-CM | POA: Insufficient documentation

## 2014-03-29 DIAGNOSIS — L03114 Cellulitis of left upper limb: Secondary | ICD-10-CM | POA: Insufficient documentation

## 2014-03-29 DIAGNOSIS — L0390 Cellulitis, unspecified: Secondary | ICD-10-CM

## 2014-03-29 DIAGNOSIS — L0291 Cutaneous abscess, unspecified: Secondary | ICD-10-CM

## 2014-03-29 DIAGNOSIS — I10 Essential (primary) hypertension: Secondary | ICD-10-CM | POA: Insufficient documentation

## 2014-03-29 DIAGNOSIS — L02414 Cutaneous abscess of left upper limb: Secondary | ICD-10-CM | POA: Insufficient documentation

## 2014-03-29 DIAGNOSIS — Z880 Allergy status to penicillin: Secondary | ICD-10-CM | POA: Insufficient documentation

## 2014-03-29 DIAGNOSIS — Z79899 Other long term (current) drug therapy: Secondary | ICD-10-CM | POA: Insufficient documentation

## 2014-03-29 MED ORDER — CEPHALEXIN 500 MG PO CAPS: 500.0000 mg | ORAL_CAPSULE | Freq: Four times a day (QID) | ORAL | Status: DC

## 2014-03-29 MED ORDER — SULFAMETHOXAZOLE-TRIMETHOPRIM 800-160 MG PO TABS
1.0000 | ORAL_TABLET | Freq: Two times a day (BID) | ORAL | Status: AC
Start: 2014-03-29 — End: 2014-04-05

## 2014-03-29 NOTE — ED Provider Notes (Signed)
CSN: [id number]     Arrival date & time 03/29/14  0043 History   First MD Initiated Contact with Patient 03/29/14 0133     Chief Complaint  Patient presents with  . Abscess     (Consider location/radiation/quality/duration/timing/severity/associated sxs/prior Treatment) HPI Comments: Patient presents with swollen areas to the left arm and right leg.  No injury or trauma.  No fevers or chills.  Patient is a 23 y.o. male presenting with abscess. The history is provided by the patient.  Abscess Abscess location: left forearm and right leg. Abscess quality: induration, painful and redness   Red streaking: no   Duration:  2 days Progression:  Worsening Pain details:    Severity:  Mild   Timing:  Constant   Progression:  Worsening Chronicity:  New Context: not diabetes   Relieved by:  Nothing Worsened by:  Nothing tried Ineffective treatments:  None tried   Past Medical History  Diagnosis Date  . Hypertension    Past Surgical History  Procedure Laterality Date  . Tonsillectomy and adenoidectomy     History reviewed. No pertinent family history. History  Substance Use Topics  . Smoking status: Never Smoker   . Smokeless tobacco: Not on file  . Alcohol Use: No    Review of Systems  All other systems reviewed and are negative.     Allergies  Penicillins  Home Medications   Prior to Admission medications   Medication Sig Start Date End Date Taking? Authorizing Provider  cephALEXin (KEFLEX) 500 MG capsule Take 1 capsule (500 mg total) by mouth 4 (four) times daily. 03/29/14   Geoffery Lyonsouglas Clyde Upshaw, MD  HYDROcodone-acetaminophen (NORCO/VICODIN) 5-325 MG per tablet Take one-two tabs po q 4-6 hrs prn pain 01/05/14   Tammy L. Triplett, PA-C  ibuprofen (ADVIL,MOTRIN) 800 MG tablet Take 1 tablet (800 mg total) by mouth 3 (three) times daily. 01/05/14   Tammy L. Triplett, PA-C  sulfamethoxazole-trimethoprim (BACTRIM DS,SEPTRA DS) 800-160 MG per tablet Take 1 tablet by mouth 2 (two)  times daily. 03/29/14 04/05/14  Geoffery Lyonsouglas Jacklyne Baik, MD   BP 146/74  Pulse 60  Temp(Src) 97.7 F (36.5 C) (Oral)  Resp 16  SpO2 100% Physical Exam  Nursing note and vitals reviewed. Constitutional: He is oriented to person, place, and time. He appears well-developed and well-nourished. No distress.  HENT:  Head: Normocephalic and atraumatic.  Neck: Normal range of motion. Neck supple.  Neurological: He is alert and oriented to person, place, and time.  Skin: He is not diaphoretic.  There are two swollen, firm areas to the left forearm and one to the right leg with mild erythema.  There is no fluctuance.      ED Course  Procedures (including critical care time) Labs Review Labs Reviewed - No data to display  Imaging Review No results found.   EKG Interpretation None      MDM   Final diagnoses:  Cellulitis and abscess    These appear to be areas of cellulitis, possible early abscess without fluctuance or a target for I and D.  Will treat with keflex and bactrim.  PRN return.    Geoffery Lyonsouglas Kenzley Ke, MD 03/29/14 [phone]

## 2014-03-29 NOTE — Discharge Instructions (Signed)
Keflex and Bactrim as prescribed.  Warm soaks to affected areas as frequently as possible for the next several days.  Return to the emergency department if your swelling worsens, you develop fever, or for other new and concerning symptoms.   Cellulitis Cellulitis is an infection of the skin and the tissue beneath it. The infected area is usually red and tender. Cellulitis occurs most often in the arms and lower legs.  CAUSES  Cellulitis is caused by bacteria that enter the skin through cracks or cuts in the skin. The most common types of bacteria that cause cellulitis are staphylococci and streptococci. SIGNS AND SYMPTOMS   Redness and warmth.  Swelling.  Tenderness or pain.  Fever. DIAGNOSIS  Your health care provider can usually determine what is wrong based on a physical exam. Blood tests may also be done. TREATMENT  Treatment usually involves taking an antibiotic medicine. HOME CARE INSTRUCTIONS   Take your antibiotic medicine as directed by your health care provider. Finish the antibiotic even if you start to feel better.  Keep the infected arm or leg elevated to reduce swelling.  Apply a warm cloth to the affected area up to 4 times per day to relieve pain.  Take medicines only as directed by your health care provider.  Keep all follow-up visits as directed by your health care provider. SEEK MEDICAL CARE IF:   You notice red streaks coming from the infected area.  Your red area gets larger or turns dark in color.  Your bone or joint underneath the infected area becomes painful after the skin has healed.  Your infection returns in the same area or another area.  You notice a swollen bump in the infected area.  You develop new symptoms.  You have a fever. SEEK IMMEDIATE MEDICAL CARE IF:   You feel very sleepy.  You develop vomiting or diarrhea.  You have a general ill feeling (malaise) with muscle aches and pains. MAKE SURE YOU:   Understand these  instructions.  Will watch your condition.  Will get help right away if you are not doing well or get worse. Document Released: 03/12/2005 Document Revised: 10/17/2013 Document Reviewed: 08/18/2011 [facility] [place] Patient Information 2015 [place]. This information is not intended to replace advice given to you by your health care provider. Make sure you discuss any questions you have with your health care provider.

## 2014-03-29 NOTE — ED Notes (Signed)
Pt c/o two abscess to left forearm and one to the rt leg.

## 2014-04-06 ENCOUNTER — Encounter (HOSPITAL_COMMUNITY): Payer: Self-pay | Admitting: Emergency Medicine

## 2014-04-06 ENCOUNTER — Emergency Department (HOSPITAL_COMMUNITY)
Admission: EM | Admit: 2014-04-06 | Discharge: 2014-04-06 | Disposition: A | Discharge status: Home / Self Care | Payer: Self-pay | Attending: Emergency Medicine | Admitting: Emergency Medicine

## 2014-04-06 DIAGNOSIS — Z880 Allergy status to penicillin: Secondary | ICD-10-CM | POA: Insufficient documentation

## 2014-04-06 DIAGNOSIS — Z791 Long term (current) use of non-steroidal anti-inflammatories (NSAID): Secondary | ICD-10-CM | POA: Insufficient documentation

## 2014-04-06 DIAGNOSIS — R21 Rash and other nonspecific skin eruption: Secondary | ICD-10-CM | POA: Insufficient documentation

## 2014-04-06 DIAGNOSIS — Z792 Long term (current) use of antibiotics: Secondary | ICD-10-CM | POA: Insufficient documentation

## 2014-04-06 DIAGNOSIS — I10 Essential (primary) hypertension: Secondary | ICD-10-CM | POA: Insufficient documentation

## 2014-04-06 NOTE — ED Provider Notes (Signed)
CSN: [id number]     Arrival date & time 04/06/14  1623 History   First MD Initiated Contact with Patient 04/06/14 1638     Chief Complaint  Patient presents with  . Rash     (Consider location/radiation/quality/duration/timing/severity/associated sxs/prior Treatment) Patient is a 23 y.o. male presenting with rash. The history is provided by the patient.  Rash Location: multiple areas. Quality: itchiness and redness   Quality: not bruising, not draining, not painful, not scaling and not weeping   Severity:  Moderate Onset quality:  Gradual Duration:  2 days Progression:  Worsening Chronicity:  New Context: medications and new detergent/soap   Relieved by:  Nothing Ineffective treatments:  Anti-itch cream Associated symptoms: no abdominal pain, no diarrhea, no hoarse voice, no joint pain, no shortness of breath, no throat swelling, no tongue swelling and not wheezing     Past Medical History  Diagnosis Date  . Hypertension    Past Surgical History  Procedure Laterality Date  . Tonsillectomy and adenoidectomy     History reviewed. No pertinent family history. History  Substance Use Topics  . Smoking status: Never Smoker   . Smokeless tobacco: Not on file  . Alcohol Use: No    Review of Systems  Constitutional: Negative for activity change.       All ROS Neg except as noted in HPI  HENT: Negative for hoarse voice.   Eyes: Negative for photophobia and discharge.  Respiratory: Negative for cough, shortness of breath and wheezing.   Cardiovascular: Negative for chest pain and palpitations.  Gastrointestinal: Negative for abdominal pain, diarrhea and blood in stool.  Genitourinary: Negative for dysuria, frequency and hematuria.  Musculoskeletal: Negative for arthralgias, back pain and neck pain.  Skin: Positive for rash.  Neurological: Negative for dizziness, seizures and speech difficulty.  Psychiatric/Behavioral: Negative for hallucinations and confusion.       Allergies  Penicillins  Home Medications   Prior to Admission medications   Medication Sig Start Date End Date Taking? Authorizing Provider  cephALEXin (KEFLEX) 500 MG capsule Take 1 capsule (500 mg total) by mouth 4 (four) times daily. 03/29/14   Geoffery Lyonsouglas Delo, MD  HYDROcodone-acetaminophen (NORCO/VICODIN) 5-325 MG per tablet Take one-two tabs po q 4-6 hrs prn pain 01/05/14   Tammy L. Triplett, PA-C  ibuprofen (ADVIL,MOTRIN) 800 MG tablet Take 1 tablet (800 mg total) by mouth 3 (three) times daily. 01/05/14   Tammy L. Triplett, PA-C   BP 126/73  Pulse 62  Temp(Src) 98.6 F (37 C) (Oral)  Resp 20  Ht 5\' 9"  (1.753 m)  Wt 220 lb (99.791 kg)  BMI 32.47 kg/m2  SpO2 97% Physical Exam  Nursing note and vitals reviewed. Constitutional: He is oriented to person, place, and time. He appears well-developed and well-nourished.  Non-toxic appearance.  HENT:  Head: Normocephalic.  Right Ear: Tympanic membrane and external ear normal.  Left Ear: Tympanic membrane and external ear normal.  Eyes: EOM and lids are normal. Pupils are equal, round, and reactive to light.  Neck: Normal range of motion. Neck supple. Carotid bruit is not present.  Cardiovascular: Normal rate, regular rhythm, normal heart sounds, intact distal pulses and normal pulses.   Pulmonary/Chest: Breath sounds normal. No respiratory distress. He has no wheezes. He has no rales.  Abdominal: Soft. Bowel sounds are normal. There is no tenderness. There is no guarding.  Musculoskeletal: Normal range of motion.  Lymphadenopathy:       Head (right side): No submandibular adenopathy present.  Head (left side): No submandibular adenopathy present.    He has no cervical adenopathy.  Neurological: He is alert and oriented to person, place, and time. He has normal strength. No cranial nerve deficit or sensory deficit.  Skin: Skin is warm and dry. Rash noted.  Group of red papular rash noted on the right and left neck, both  arms and chest.  Psychiatric: He has a normal mood and affect. His speech is normal.    ED Course  Procedures (including critical care time) Labs Review Labs Reviewed - No data to display  Imaging Review No results found.   EKG Interpretation None      MDM  Discussed with the patient that this rash may be viral, from the sulfur medication, the new soap he is using or other source. He gets relief from the OTC cream. He will use benadryl for additional  Help with itching. Pt to stop the sulfur medication. He will return if any changes. Pt referred to an allergist.   Final diagnoses:  Rash    *I have reviewed nursing notes, vital signs, and all appropriate lab and imaging results for this patient.Kathie Dike**    Trixy Loyola M Makinzey Banes, PA-C 04/06/14 1712

## 2014-04-06 NOTE — ED Notes (Addendum)
Was seen here one weeks ago for an abscess.  Rash times 2 days.  Is on septra and keflex

## 2014-04-06 NOTE — Discharge Instructions (Signed)
Continue your over the counter cream. Stop the sulfur medications. Use benadryl for itching. Make a list of any new foods, soap, clothing, detergent, etc.. Return if any changes or problem. Rash A rash is a change in the color or feel of your skin. There are many different types of rashes. You may have other problems along with your rash. HOME CARE  Avoid the thing that caused your rash.  Do not scratch your rash.  You may take cools baths to help stop itching.  Only take medicines as told by your doctor.  Keep all doctor visits as told. GET HELP RIGHT AWAY IF:   Your pain, puffiness (swelling), or redness gets worse.  You have a fever.  You have new or severe problems.  You have body aches, watery poop (diarrhea), or you throw up (vomit).  Your rash is not better after 3 days. MAKE SURE YOU:   Understand these instructions.  Will watch your condition.  Will get help right away if you are not doing well or get worse. Document Released: 11/19/2007 Document Revised: 08/25/2011 Document Reviewed: 03/17/2011 [facility] Patient Information 2015 [place], [place]. This information is not intended to replace advice given to you by your health care provider. Make sure you discuss any questions you have with your health care provider.

## 2014-04-06 NOTE — ED Provider Notes (Signed)
Medical screening examination/treatment/procedure(s) were performed by non-physician practitioner and as supervising physician I was immediately available for consultation/collaboration.   EKG Interpretation None        Gilda Creasehristopher J. Pollina, MD 04/06/14 [phone]

## 2014-08-20 ENCOUNTER — Encounter (HOSPITAL_COMMUNITY): Payer: Self-pay

## 2014-08-20 ENCOUNTER — Emergency Department (HOSPITAL_COMMUNITY)
Admission: EM | Admit: 2014-08-20 | Discharge: 2014-08-21 | Disposition: A | Discharge status: Home / Self Care | Payer: Medicaid Other | Attending: Emergency Medicine | Admitting: Emergency Medicine

## 2014-08-20 ENCOUNTER — Emergency Department (HOSPITAL_COMMUNITY): Payer: Medicaid Other

## 2014-08-20 DIAGNOSIS — R1013 Epigastric pain: Secondary | ICD-10-CM | POA: Insufficient documentation

## 2014-08-20 DIAGNOSIS — Z880 Allergy status to penicillin: Secondary | ICD-10-CM | POA: Diagnosis not present

## 2014-08-20 DIAGNOSIS — I10 Essential (primary) hypertension: Secondary | ICD-10-CM | POA: Insufficient documentation

## 2014-08-20 DIAGNOSIS — Z792 Long term (current) use of antibiotics: Secondary | ICD-10-CM | POA: Insufficient documentation

## 2014-08-20 DIAGNOSIS — R109 Unspecified abdominal pain: Secondary | ICD-10-CM | POA: Diagnosis present

## 2014-08-20 DIAGNOSIS — R1010 Upper abdominal pain, unspecified: Secondary | ICD-10-CM

## 2014-08-20 NOTE — ED Notes (Signed)
Generalized upper abd pain for one week, no relief with OTC meds. Denies vomiting or diarrhea.

## 2014-08-20 NOTE — ED Provider Notes (Signed)
CSN: [id number]     Arrival date & time 08/20/14  2212 History  This chart was scribed for Matthew Co, MD by Tonye Royalty, ED Scribe. This patient was seen in room APA08/APA08 and the patient's care was started at 11:35 PM.    Chief Complaint  Patient presents with  . Abdominal Pain   The history is provided by the patient. No language interpreter was used.    HPI Comments: Matthew Alvarez is a 24 y.o. male who presents to the Emergency Department complaining of abdominal pain with onset 1 week ago. He locates it to his epigastric area when laying down and in mid abdomen when sitting up. He states symptoms are persistent since onset but have not worsened. He notes he exercises often but denies any significant changes. He states pain seems to sometimes be worse with food, depending on what he eats. He states he has used some "gas pills" with some improvement. He denies nausea, vomiting, diarrhea, blood in stool, dark stool, fever, constipation, urinary changes, back pain, flank pain, or SOB.  PCP: Cassell Smiles., MD   Past Medical History  Diagnosis Date  . Hypertension    Past Surgical History  Procedure Laterality Date  . Tonsillectomy and adenoidectomy     No family history on file. History  Substance Use Topics  . Smoking status: Never Smoker   . Smokeless tobacco: Not on file  . Alcohol Use: No    Review of Systems A complete 10 system review of systems was obtained and all systems are negative except as noted in the HPI and PMH.    Allergies  Penicillins  Home Medications   Prior to Admission medications   Medication Sig Start Date End Date Taking? Authorizing Provider  Ranitidine HCl (ACID REDUCER PO) Take 1-2 tablets by mouth once as needed (for stomach upset).   Yes Historical Provider, MD  cephALEXin (KEFLEX) 500 MG capsule Take 1 capsule (500 mg total) by mouth 4 (four) times daily. 03/29/14   Geoffery Lyons, MD  HYDROcodone-acetaminophen (NORCO/VICODIN) 5-325 MG  per tablet Take one-two tabs po q 4-6 hrs prn pain Patient not taking: Reported on 08/20/2014 01/05/14   Tammy L. Triplett, PA-C  ibuprofen (ADVIL,MOTRIN) 800 MG tablet Take 1 tablet (800 mg total) by mouth 3 (three) times daily. Patient not taking: Reported on 08/20/2014 01/05/14   Tammy L. Triplett, PA-C   BP 123/73 mmHg  Pulse 59  Temp(Src) 98.9 F (37.2 C) (Oral)  Resp 20  Ht  (1.753 m)  Wt 215 lb (97.523 kg)  BMI 31.74 kg/m2  SpO2 100% Physical Exam  Constitutional: He is oriented to person, place, and time. He appears well-developed and well-nourished.  HENT:  Head: Normocephalic and atraumatic.  Eyes: EOM are normal.  Neck: Normal range of motion.  Cardiovascular: Normal rate, regular rhythm, normal heart sounds and intact distal pulses.   Pulmonary/Chest: Effort normal and breath sounds normal. No respiratory distress.  Abdominal: Soft. He exhibits no distension. There is no tenderness.  Musculoskeletal: Normal range of motion.  Neurological: He is alert and oriented to person, place, and time.  Skin: Skin is warm and dry.  Psychiatric: He has a normal mood and affect. Judgment normal.  Nursing note and vitals reviewed.   ED Course  Procedures (including critical care time)  DIAGNOSTIC STUDIES: Oxygen Saturation is 100% on room air, normal by my interpretation.    COORDINATION OF CARE: 11:39 PM Discussed treatment plan with patient at beside, including  abdominal x-ray. If x-ray is good, will prescribe Prilosec and refer to GI for follow up. The patient agrees with the plan and has no further questions at this time.   Labs Review Labs Reviewed - No data to display  Imaging Review No results found.   EKG Interpretation None      MDM   Final diagnoses:  Upper abdominal pain    Patient is overall well-appearing.  His vital signs are normal.  I suspect this is developing gastritis given his discomfort.  He'll be placed on twice a day Prilosec.  Outpatient  PCP and GI follow-up.  Doubt pancreatitis.  Denies nausea vomiting or diarrhea.  No fevers or chills.  Doubt cholelithiasis.  I personally performed the services described in this documentation, which was scribed in my presence. The recorded information has been reviewed and is accurate.      Matthew CoKevin M Laurey Salser, MD 08/21/14 [phone]

## 2014-08-21 MED ORDER — FAMOTIDINE 20 MG PO TABS
ORAL_TABLET | ORAL | Status: AC
  Administered 2014-08-21: 20 mg
  Filled 2014-08-21: qty 1

## 2014-08-21 MED ORDER — OMEPRAZOLE 20 MG PO CPDR: 20.0000 mg | DELAYED_RELEASE_CAPSULE | Freq: Two times a day (BID) | ORAL | Status: DC

## 2014-08-21 NOTE — Discharge Instructions (Signed)

## 2015-08-02 ENCOUNTER — Emergency Department (HOSPITAL_COMMUNITY): Payer: Medicaid Other

## 2015-08-02 ENCOUNTER — Encounter (HOSPITAL_COMMUNITY): Payer: Self-pay | Admitting: Emergency Medicine

## 2015-08-02 ENCOUNTER — Emergency Department (HOSPITAL_COMMUNITY)
Admission: EM | Admit: 2015-08-02 | Discharge: 2015-08-02 | Disposition: A | Discharge status: Home / Self Care | Payer: Medicaid Other | Attending: Emergency Medicine | Admitting: Emergency Medicine

## 2015-08-02 DIAGNOSIS — I10 Essential (primary) hypertension: Secondary | ICD-10-CM | POA: Diagnosis not present

## 2015-08-02 DIAGNOSIS — R1084 Generalized abdominal pain: Secondary | ICD-10-CM | POA: Diagnosis not present

## 2015-08-02 DIAGNOSIS — R531 Weakness: Secondary | ICD-10-CM | POA: Insufficient documentation

## 2015-08-02 DIAGNOSIS — R197 Diarrhea, unspecified: Secondary | ICD-10-CM | POA: Insufficient documentation

## 2015-08-02 DIAGNOSIS — Z880 Allergy status to penicillin: Secondary | ICD-10-CM | POA: Diagnosis not present

## 2015-08-02 DIAGNOSIS — J3489 Other specified disorders of nose and nasal sinuses: Secondary | ICD-10-CM | POA: Diagnosis not present

## 2015-08-02 DIAGNOSIS — R1013 Epigastric pain: Secondary | ICD-10-CM | POA: Diagnosis present

## 2015-08-02 DIAGNOSIS — R112 Nausea with vomiting, unspecified: Secondary | ICD-10-CM

## 2015-08-02 DIAGNOSIS — G479 Sleep disorder, unspecified: Secondary | ICD-10-CM | POA: Diagnosis not present

## 2015-08-02 LAB — CBC WITH DIFFERENTIAL/PLATELET
BASOS ABS: 0 10*3/uL (ref 0.0–0.1)
BASOS PCT: 0 %
Eosinophils Absolute: 0.1 10*3/uL (ref 0.0–0.7)
Eosinophils Relative: 1 %
HEMATOCRIT: 39.2 % (ref 39.0–52.0)
HEMOGLOBIN: 12.9 g/dL — AB (ref 13.0–17.0)
Lymphocytes Relative: 18 %
Lymphs Abs: 2.7 10*3/uL (ref 0.7–4.0)
MCH: 27.7 pg (ref 26.0–34.0)
MCHC: 32.9 g/dL (ref 30.0–36.0)
MCV: 84.1 fL (ref 78.0–100.0)
Monocytes Absolute: 0.7 10*3/uL (ref 0.1–1.0)
Monocytes Relative: 5 %
NEUTROS ABS: 11.1 10*3/uL — AB (ref 1.7–7.7)
NEUTROS PCT: 76 %
Platelets: 258 10*3/uL (ref 150–400)
RBC: 4.66 MIL/uL (ref 4.22–5.81)
RDW: 13.1 % (ref 11.5–15.5)
WBC: 14.6 10*3/uL — AB (ref 4.0–10.5)

## 2015-08-02 LAB — URINE MICROSCOPIC-ADD ON
Bacteria, UA: NONE SEEN
Squamous Epithelial / LPF: NONE SEEN
WBC, UA: NONE SEEN WBC/hpf (ref 0–5)

## 2015-08-02 LAB — RAPID URINE DRUG SCREEN, HOSP PERFORMED
AMPHETAMINES: NOT DETECTED
BARBITURATES: NOT DETECTED
BENZODIAZEPINES: NOT DETECTED
COCAINE: NOT DETECTED
Opiates: NOT DETECTED
TETRAHYDROCANNABINOL: NOT DETECTED

## 2015-08-02 LAB — URINALYSIS, ROUTINE W REFLEX MICROSCOPIC
Bilirubin Urine: NEGATIVE
Glucose, UA: NEGATIVE mg/dL
KETONES UR: NEGATIVE mg/dL
Leukocytes, UA: NEGATIVE
Nitrite: NEGATIVE
PROTEIN: NEGATIVE mg/dL
Specific Gravity, Urine: >1.03 — ABNORMAL HIGH (ref 1.005–1.030)
pH: 5 (ref 5.0–8.0)

## 2015-08-02 LAB — COMPREHENSIVE METABOLIC PANEL
ALT: 149 U/L — ABNORMAL HIGH (ref 17–63)
ANION GAP: 7 (ref 5–15)
AST: 244 U/L — ABNORMAL HIGH (ref 15–41)
Albumin: 4 g/dL (ref 3.5–5.0)
Alkaline Phosphatase: 113 U/L (ref 38–126)
BUN: 17 mg/dL (ref 6–20)
CALCIUM: 8.3 mg/dL — AB (ref 8.9–10.3)
CO2: 28 mmol/L (ref 22–32)
Chloride: 103 mmol/L (ref 101–111)
Creatinine, Ser: 0.94 mg/dL (ref 0.61–1.24)
GFR calc non Af Amer: >60 mL/min (ref >60)
Glucose, Bld: 119 mg/dL — ABNORMAL HIGH (ref 65–99)
Potassium: 3.2 mmol/L — ABNORMAL LOW (ref 3.5–5.1)
SODIUM: 138 mmol/L (ref 135–145)
TOTAL PROTEIN: 7.2 g/dL (ref 6.5–8.1)
Total Bilirubin: 0.6 mg/dL (ref 0.3–1.2)

## 2015-08-02 LAB — LIPASE, BLOOD: Lipase: 26 U/L (ref 11–51)

## 2015-08-02 MED ORDER — DIATRIZOATE MEGLUMINE & SODIUM 66-10 % PO SOLN
ORAL | Status: AC
  Administered 2015-08-02: 30 mL
  Filled 2015-08-02: qty 30

## 2015-08-02 MED ORDER — ONDANSETRON HCL 4 MG/2ML IJ SOLN
4.0000 mg | Freq: Once | INTRAMUSCULAR | Status: AC
  Administered 2015-08-02: 4 mg via INTRAVENOUS
  Filled 2015-08-02: qty 2

## 2015-08-02 MED ORDER — SODIUM CHLORIDE 0.9 % IV SOLN: INTRAVENOUS | Status: DC

## 2015-08-02 MED ORDER — IOHEXOL 300 MG/ML  SOLN
100.0000 mL | Freq: Once | INTRAMUSCULAR | Status: AC | PRN
  Administered 2015-08-02: 100 mL via INTRAVENOUS

## 2015-08-02 MED ORDER — ONDANSETRON HCL 8 MG PO TABS: 8.0000 mg | ORAL_TABLET | Freq: Three times a day (TID) | ORAL | Status: DC | PRN

## 2015-08-02 MED ORDER — HYDROMORPHONE HCL 1 MG/ML IJ SOLN
1.0000 mg | Freq: Once | INTRAMUSCULAR | Status: AC
  Administered 2015-08-02: 1 mg via INTRAVENOUS
  Filled 2015-08-02: qty 1

## 2015-08-02 MED ORDER — SODIUM CHLORIDE 0.9 % IV BOLUS (SEPSIS)
1000.0000 mL | Freq: Once | INTRAVENOUS | Status: AC
  Administered 2015-08-02: 1000 mL via INTRAVENOUS

## 2015-08-02 NOTE — ED Provider Notes (Signed)
CSN: [id number]     Arrival date & time 08/02/15  1191 History  By signing my name below, I, Matthew Alvarez, attest that this documentation has been prepared under the direction and in the presence of Matthew Bale, MD. Electronically Signed: Murriel Alvarez, ED Scribe. 08/02/2015. 8:36 AM.    Chief Complaint  Patient presents with  . Abdominal Pain      Patient is a 25 y.o. male presenting with abdominal pain. The history is provided by the patient. No language interpreter was used.  Abdominal Pain Associated symptoms: diarrhea   Associated symptoms: no cough and no fever    HPI Comments: Matthew Alvarez is a 25 y.o. male who presents to the Emergency Department complaining of intermittent, worsening epigastric abdominal pain that has been present since yesterday while pt was at work. Pt states he originally thought the pain was gas, and continued working throughout the day. 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 Pt reports having trouble going to sleep last night due to pain. Pt states he then woke up early this morning, and had one very watery stool in addition to two episodes of vomiting. Pt reports having associated weakness and rhinorrhea as well. Pt denies fever, cough.   Past Medical History  Diagnosis Date  . Hypertension    Past Surgical History  Procedure Laterality Date  . Tonsillectomy and adenoidectomy     History reviewed. No pertinent family history. Social History  Substance Use Topics  . Smoking status: Never Smoker   . Smokeless tobacco: None  . Alcohol Use: No    Review of Systems  Constitutional: Negative for fever.  HENT: Positive for rhinorrhea.   Respiratory: Negative for cough.   Gastrointestinal: Positive for abdominal pain and diarrhea.  Neurological: Positive for weakness.  Psychiatric/Behavioral: Positive for sleep disturbance.  All other systems reviewed and are negative.     Allergies  Penicillins  Home Medications    Prior to Admission medications   Medication Sig Start Date End Date Taking? Authorizing Provider  Chlorphen-PE-Acetaminophen (ALLERGY MULTI-SYMPTOM DAYTIME PO) Take 1 tablet by mouth.   Yes Historical Provider, MD  sodium chloride (OCEAN) 0.65 % SOLN nasal spray Place 1 spray into both nostrils as needed for congestion.   Yes Historical Provider, MD  omeprazole (PRILOSEC) 20 MG capsule Take 1 capsule (20 mg total) by mouth 2 (two) times daily before a meal. Patient not taking: Reported on 08/02/2015 08/21/14   Azalia Bilis, MD  ondansetron (ZOFRAN) 8 MG tablet Take 1 tablet (8 mg total) by mouth every 8 (eight) hours as needed for nausea or vomiting. 08/02/15   Matthew Bale, MD  Ranitidine HCl (ACID REDUCER PO) Take 1-2 tablets by mouth once as needed (for stomach upset). Reported on 08/02/2015    Historical Provider, MD   BP 127/63 mmHg  Pulse 52  Temp(Src) 98.4 F (36.9 C) (Oral)  Resp 16  Ht  (1.753 m)  Wt 215 lb (97.523 kg)  BMI 31.74 kg/m2  SpO2 100% Physical Exam  Constitutional: He is oriented to person, place, and time. He appears well-developed and well-nourished.  HENT:  Head: Normocephalic and atraumatic.  Right Ear: External ear normal.  Left Ear: External ear normal.  Eyes: Conjunctivae and EOM are normal. Pupils are equal, round, and reactive to light.  Neck: Normal range of motion and phonation normal. Neck supple.  Cardiovascular: Normal rate, regular rhythm and normal heart sounds.   Pulmonary/Chest: Effort normal and breath sounds  normal. He exhibits no bony tenderness.  Abdominal: Soft. There is no tenderness.  Bowel sounds hypoactive Mild diffuse tenderness and guarding, but no rebound tenderness throughout abdomen  Musculoskeletal: Normal range of motion.  Neurological: He is alert and oriented to person, place, and time. No cranial nerve deficit or sensory deficit. He exhibits normal muscle tone. Coordination normal.  Skin: Skin is warm, dry and intact.   Skin warm to the touch   Psychiatric: He has a normal mood and affect. His behavior is normal. Judgment and thought content normal.  Nursing note and vitals reviewed.   ED Course  Procedures (including critical care time)  DIAGNOSTIC STUDIES: Oxygen Saturation is 100% on room air, normal by my interpretation.    COORDINATION OF CARE: 8:15 AM Discussed treatment plan with pt at bedside and pt agreed to plan.  Medications  0.9 %  sodium chloride infusion (not administered)  sodium chloride 0.9 % bolus 1,000 mL (0 mLs Intravenous Stopped 08/02/15 0940)  ondansetron (ZOFRAN) injection 4 mg (4 mg Intravenous Given 08/02/15 0826)  HYDROmorphone (DILAUDID) injection 1 mg (1 mg Intravenous Given 08/02/15 0956)  diatrizoate meglumine-sodium (GASTROGRAFIN) 66-10 % solution (30 mLs  Given 08/02/15 1010)  iohexol (OMNIPAQUE) 300 MG/ML solution 100 mL (100 mLs Intravenous Contrast Given 08/02/15 1010)    Patient Vitals for the past 24 hrs:  BP Temp Temp src Pulse Resp SpO2 Height Weight  08/02/15 1431 127/63 mmHg 98.4 F (36.9 C) Oral (!) 52 16 100 % - -  08/02/15 1303 122/61 mmHg - - 64 18 98 % - -  08/02/15 1030 (!) 121/50 mmHg - - - - - - -  08/02/15 1015 - - - 90 - 100 % - -  08/02/15 1000 133/65 mmHg - - 79 - 100 % - -  08/02/15 0755 124/57 mmHg 97.8 F (36.6 C) Oral 84 16 100 % - -  08/02/15 0753 - - - - - - 5\' 9"  (1.753 m) 215 lb (97.523 kg)    At discharge- Reevaluation with update and discussion. After initial assessment and treatment, an updated evaluation reveals tolerating oral fluids, no additional complaints. He is comfortable. Findings discussed with the patient, all questions were answered. Matthew Alvarez    Labs Review Labs Reviewed  COMPREHENSIVE METABOLIC PANEL - Abnormal; Notable for the following:    Potassium 3.2 (*)    Glucose, Bld 119 (*)    Calcium 8.3 (*)    AST 244 (*)    ALT 149 (*)    All other components within normal limits  CBC WITH  DIFFERENTIAL/PLATELET - Abnormal; Notable for the following:    WBC 14.6 (*)    Hemoglobin 12.9 (*)    Neutro Abs 11.1 (*)    All other components within normal limits  URINALYSIS, ROUTINE W REFLEX MICROSCOPIC (NOT AT [facility]) - Abnormal; Notable for the following:    Specific Gravity, Urine >1.030 (*)    Hgb urine dipstick TRACE (*)    All other components within normal limits  LIPASE, BLOOD  URINE RAPID DRUG SCREEN, HOSP PERFORMED  URINE MICROSCOPIC-ADD ON    Imaging Review US Abdomen Complete  08/02/2015  CLINICAL DATA:  Upper abdominal pain since yesterday. Small amount of pericholecystic fluid suggested on CT. EXAM: ABDOMEN ULTRASOUND COMPLETE COMPARISON:  Current abdomen pelvis CT FINDINGS: Gallbladder: Wall is mildly prominent but the gallbladder only mildly distended. There is no convincing wall thickening. No pericholecystic fluid. No stones. Common bile duct: Diameter: 3.4 mm Liver: No focal  lesion identified. Within normal limits in parenchymal echogenicity. IVC: Suboptimally visualized. Pancreas: Suboptimally visualized.  No gross abnormality. Spleen: Size and appearance within normal limits. Right Kidney: Length: 10.9 cm. Echogenicity within normal limits. No mass or hydronephrosis visualized. Left Kidney: Length: 11.4 cm. Echogenicity within normal limits. No mass or hydronephrosis visualized. Abdominal aorta: No aneurysm seen. Distal aorta obscured by bowel gas. Other findings: None. IMPRESSION: 1. Normal exam. No pericholecystic fluid. No evidence acute cholecystitis. No gallstones. 2. Midline structures not well visualized due to overlying bowel gas Electronically Signed   By: Amie Portland M.D.   On: 08/02/2015 12:24   Ct Abdomen Pelvis W Contrast  08/02/2015  CLINICAL DATA:  Upper abdominal pain since yesterday. EXAM: CT ABDOMEN AND PELVIS WITH CONTRAST TECHNIQUE: Multidetector CT imaging of the abdomen and pelvis was performed using the standard protocol following bolus  administration of intravenous contrast. CONTRAST:  OMNIPAQUE IOHEXOL 300 MG/ML  SOLN COMPARISON:  None. FINDINGS: Gynecomastia is identified. Contrast in the distal esophagus may represent sequela of reflux. Lung bases otherwise within normal limits. There is a tiny amount of free fluid in the pelvis. No other abnormal fluid in the abdomen or pelvis. No free air. The liver and portal vein are within normal limits. There is pericholecystic fluid identified without definitive wall thickening or stones. No intra or extrahepatic bile duct dilatation. The adrenal glands, kidneys, spleen, and pancreas are normal. The abdominal aorta is normal in caliber. No peripancreatic stranding identified to suggest pancreatitis. The stomach and small bowel are normal. The colon is within normal limits. There is a small appendicolith in the distal aspect of the appendix. However, there is no wall thickening or periappendiceal stranding. The appendix is normal in caliber as well. There is a periumbilical fat containing hernia. No adenopathy or mass in the pelvis. The bladder is well distended and normal in appearance. The prostate and seminal vesicles are normal as well. The visualized bones are normal. IMPRESSION: 1. Pericholecystic fluid with no wall thickening or stones identified. Ultrasound could better evaluate the gallbladder. The small amount of fluid in the pelvis is nonspecific but may have migrated inferiorly from the pericholecystic fluid. 2. Small appendicolith with no evidence of acute appendicitis. Electronically Signed   By: Gerome Sam III M.D   On: 08/02/2015 10:39   I have personally reviewed and evaluated these images and lab results as part of my medical decision-making.     9:17 AM Pt re-checked. Pt able to tolerate fluids and states he is feeling better.   MDM   Final diagnoses:  Nausea vomiting and diarrhea  Generalized abdominal pain    Evaluation consistent with viral illness, with  evidence for metabolic instability or suggestion for impending vascular collapse. He required evaluation for acute abdominal abnormalities with advanced imaging, including appendicitis, and gallbladder disease, these images were normal.  Nursing Notes Reviewed/ Care Coordinated Applicable Imaging Reviewed Interpretation of Laboratory Data incorporated into ED treatment  The patient appears reasonably screened and/or stabilized for discharge and I doubt any other medical condition or other [facility] requiring further screening, evaluation, or treatment in the ED at this time prior to discharge.  Plan: Home Medications- Zofran; Home Treatments- gradually advance diet; return here if the recommended treatment, does not improve the symptoms; Recommended follow up- PCP, when necessary   I personally performed the services described in this documentation, which was scribed in my presence. The recorded information has been reviewed and is accurate.     Matthew Bale,  MD 08/02/15 1654

## 2015-08-02 NOTE — ED Notes (Signed)
Pt. Tolerating ginger ale, reports nausea is better, but abd pain in still present.

## 2015-08-02 NOTE — ED Notes (Signed)
Pt given ginger ale to sip. Instructed to wait at least 20 minutes after zofran administration and nausea has resolved.

## 2015-08-02 NOTE — ED Notes (Signed)
EDP notified of pain 

## 2015-08-02 NOTE — Discharge Instructions (Signed)
°Abdominal Pain, Adult °Many things can cause abdominal pain. Usually, abdominal pain is not caused by a disease and will improve without treatment. It can often be observed and treated at home. Your health care provider will do a physical exam and possibly order blood tests and X-rays to help determine the seriousness of your pain. However, in many cases, more time must pass before a clear cause of the pain can be found. Before that point, your health care provider may not know if you need more testing or further treatment. °HOME CARE INSTRUCTIONS °Monitor your abdominal pain for any changes. The following actions may help to alleviate any discomfort you are experiencing: °· Only take over-the-counter or prescription medicines as directed by your health care provider. °· Do not take laxatives unless directed to do so by your health care provider. °· Try a clear liquid diet (broth, tea, or water) as directed by your health care provider. Slowly move to a bland diet as tolerated. °SEEK MEDICAL CARE IF: °· You have unexplained abdominal pain. °· You have abdominal pain associated with nausea or diarrhea. °· You have pain when you urinate or have a bowel movement. °· You experience abdominal pain that wakes you in the night. °· You have abdominal pain that is worsened or improved by eating food. °· You have abdominal pain that is worsened with eating fatty foods. °· You have a fever. °SEEK IMMEDIATE MEDICAL CARE IF: °· Your pain does not go away within 2 hours. °· You keep throwing up (vomiting). °· Your pain is felt only in portions of the abdomen, such as the right side or the left lower portion of the abdomen. °· You pass bloody or black tarry stools. °MAKE SURE YOU: °· Understand these instructions. °· Will watch your condition. °· Will get help right away if you are not doing well or get worse. °  °This information is not intended to replace advice given to you by your health care provider. Make sure you discuss  any questions you have with your health care provider. °  °Document Released: 03/12/2005 Document Revised: 02/21/2015 Document Reviewed: 02/09/2013 °Elsevier Interactive Patient Education ©2016 [employer] Inc[employer] °Diarrhea °Diarrhea is watery poop (stool). It can make you feel weak, tired, thirsty, or give you a dry mouth (signs of dehydration). Watery poop is a sign of another problem, most often an infection. It often lasts 2-3 days. It can last longer if it is a sign of something serious. Take care of yourself as told by your doctor. °HOME CARE  °· Drink 1 cup (8 ounces) of fluid each time you have watery poop. °· Do not drink the following fluids: °· Those that contain simple sugars (fructose, glucose, galactose, lactose, sucrose, maltose). °· Sports drinks. °· Fruit juices. °· Whole milk products. °· Sodas. °· Drinks with caffeine (coffee, tea, soda) or alcohol. °· Oral rehydration solution may be used if the doctor says it is okay. You may make your own solution. Follow this recipe: °·  - teaspoon table salt. °· ¾ teaspoon baking soda. °·  teaspoon salt substitute containing potassium chloride. °· 1 tablespoons sugar. °· 1 liter (34 ounces) of water. °· Avoid the following foods: °· High fiber foods, such as raw fruits and vegetables. °· Nuts, seeds, and whole grain breads and cereals. °·  Those that are sweetened with sugar alcohols (xylitol, sorbitol, mannitol). °· Try eating the following foods: °· Starchy foods, such as rice, toast, pasta, low-sugar cereal, oatmeal, baked potatoes, crackers, and bagels. °·   bagels.  Bananas.  Applesauce.  Eat probiotic-rich foods, such as yogurt and milk products that are fermented.  Wash your hands well after each time you have watery poop.  Only take medicine as told by your doctor.  Take a warm bath to help lessen burning or pain from having watery poop. GET HELP RIGHT AWAY IF:   You cannot drink fluids without throwing up (vomiting).  You keep throwing up.  You  have blood in your poop, or your poop looks black and tarry.  You do not pee (urinate) in 6-8 hours, or there is only a small amount of very dark pee.  You have belly (abdominal) pain that gets worse or stays in the same spot (localizes).  You are weak, dizzy, confused, or light-headed.  You have a very bad headache.  Your watery poop gets worse or does not get better.  You have a fever or lasting symptoms for more than 2-3 days.  You have a fever and your symptoms suddenly get worse. MAKE SURE YOU:   Understand these instructions.  Will watch your condition.  Will get help right away if you are not doing well or get worse.   This information is not intended to replace advice given to you by your health care provider. Make sure you discuss any questions you have with your health care provider.   Document Released: 11/19/2007 Document Revised: 06/23/2014 Document Reviewed: 02/08/2012 Elsevier Interactive Patient Education 2016 [employer].  Food Choices to Help Relieve Diarrhea, Adult When you have diarrhea, the foods you eat and your eating habits are very important. Choosing the right foods and drinks can help relieve diarrhea. Also, because diarrhea can last up to 7 days, you need to replace lost fluids and electrolytes (such as sodium, potassium, and chloride) in order to help prevent dehydration.  WHAT GENERAL GUIDELINES DO I NEED TO FOLLOW?  Slowly drink 1 cup (8 oz) of fluid for each episode of diarrhea. If you are getting enough fluid, your urine will be clear or pale yellow.  Eat starchy foods. Some good choices include white rice, white toast, pasta, low-fiber cereal, baked potatoes (without the skin), saltine crackers, and bagels.  Avoid large servings of any cooked vegetables.  Limit fruit to two servings per day. A serving is  cup or 1 small piece.  Choose foods with less than 2 g of fiber per serving.  Limit fats to less than 8 tsp (38 g) per day.  Avoid  fried foods.  Eat foods that have probiotics in them. Probiotics can be found in certain dairy products.  Avoid foods and beverages that may increase the speed at which food moves through the stomach and intestines (gastrointestinal tract). Things to avoid include:  High-fiber foods, such as dried fruit, raw fruits and vegetables, nuts, seeds, and whole grain foods.  Spicy foods and high-fat foods.  Foods and beverages sweetened with high-fructose corn syrup, honey, or sugar alcohols such as xylitol, sorbitol, and mannitol. WHAT FOODS ARE RECOMMENDED? Grains White rice. White, [place], or pita breads (fresh or toasted), including plain rolls, buns, or bagels. White pasta. Saltine, soda, or graham crackers. Pretzels. Low-fiber cereal. Cooked cereals made with water (such as cornmeal, farina, or cream cereals). Plain muffins. Matzo. Melba toast. Zwieback.  Vegetables Potatoes (without the skin). Strained tomato and vegetable juices. Most well-cooked and canned vegetables without seeds. Tender lettuce. Fruits Cooked or canned applesauce, apricots, cherries, fruit cocktail, grapefruit, peaches, pears, or plums. Fresh bananas, apples without skin,  cherries, grapes, cantaloupe, grapefruit, peaches, oranges, or plums.  Meat and Other Protein Products Baked or boiled chicken. Eggs. Tofu. Fish. Seafood. Smooth peanut butter. Ground or well-cooked tender beef, ham, veal, lamb, pork, or poultry.  Dairy Plain yogurt, kefir, and unsweetened liquid yogurt. Lactose-free milk, buttermilk, or soy milk. Plain hard cheese. Beverages Sport drinks. Clear broths. Diluted fruit juices (except prune). Regular, caffeine-free sodas such as ginger ale. Water. Decaffeinated teas. Oral rehydration solutions. Sugar-free beverages not sweetened with sugar alcohols. Other Bouillon, broth, or soups made from recommended foods.  The items listed above may not be a complete list of recommended foods or beverages. Contact your  dietitian for more options. WHAT FOODS ARE NOT RECOMMENDED? Grains Whole grain, whole wheat, bran, or rye breads, rolls, pastas, crackers, and cereals. Wild or brown rice. Cereals that contain more than 2 g of fiber per serving. Corn tortillas or taco shells. Cooked or dry oatmeal. Granola. Popcorn. Vegetables Raw vegetables. Cabbage, broccoli, Brussels sprouts, artichokes, baked beans, beet greens, corn, kale, legumes, peas, sweet potatoes, and yams. Potato skins. Cooked spinach and cabbage. Fruits Dried fruit, including raisins and dates. Raw fruits. Stewed or dried prunes. Fresh apples with skin, apricots, mangoes, pears, raspberries, and strawberries.  Meat and Other Protein Products Chunky peanut butter. Nuts and seeds. Beans and lentils. Tomasa Blase.  Dairy High-fat cheeses. Milk, chocolate milk, and beverages made with milk, such as milk shakes. Cream. Ice cream. Sweets and Desserts Sweet rolls, doughnuts, and sweet breads. Pancakes and waffles. Fats and Oils Butter. Cream sauces. Margarine. Salad oils. Plain salad dressings. Olives. Avocados.  Beverages Caffeinated beverages (such as coffee, tea, soda, or energy drinks). Alcoholic beverages. Fruit juices with pulp. Prune juice. Soft drinks sweetened with high-fructose corn syrup or sugar alcohols. Other Coconut. Hot sauce. Chili powder. Mayonnaise. Gravy. Cream-based or milk-based soups.  The items listed above may not be a complete list of foods and beverages to avoid. Contact your dietitian for more information. WHAT SHOULD I DO IF I BECOME DEHYDRATED? Diarrhea can sometimes lead to dehydration. Signs of dehydration include dark urine and dry mouth and skin. If you think you are dehydrated, you should rehydrate with an oral rehydration solution. These solutions can be purchased at pharmacies, retail stores, or online.  Drink -1 cup (120-240 mL) of oral rehydration solution each time you have an episode of diarrhea. If drinking this amount  makes your diarrhea worse, try drinking smaller amounts more often. For example, drink 1-3 tsp (5-15 mL) every 5-10 minutes.  A general rule for staying hydrated is to drink 1-2 L of fluid per day. Talk to your health care provider about the specific amount you should be drinking each day. Drink enough fluids to keep your urine clear or pale yellow.   This information is not intended to replace advice given to you by your health care provider. Make sure you discuss any questions you have with your health care provider.   Document Released: 08/23/2003 Document Revised: 06/23/2014 Document Reviewed: 04/25/2013 Elsevier Interactive Patient Education 2016 Elsevier Inc.  Nausea and Vomiting Nausea means you feel sick to your stomach. Throwing up (vomiting) is a reflex where stomach contents come out of your mouth. HOME CARE   Take medicine as told by your doctor.  Do not force yourself to eat. However, you do need to drink fluids.  If you feel like eating, eat a normal diet as told by your doctor.  Eat rice, wheat, potatoes, bread, lean meats, yogurt, fruits, and vegetables.  Avoid high-fat foods.  Drink enough fluids to keep your pee (urine) clear or pale yellow.  Ask your doctor how to replace body fluid losses (rehydrate). Signs of body fluid loss (dehydration) include:  Feeling very thirsty.  Dry lips and mouth.  Feeling dizzy.  Dark pee.  Peeing less than normal.  Feeling confused.  Fast breathing or heart rate. GET HELP RIGHT AWAY IF:   You have blood in your throw up.  You have black or bloody poop (stool).  You have a bad headache or stiff neck.  You feel confused.  You have bad belly (abdominal) pain.  You have chest pain or trouble breathing.  You do not pee at least once every 8 hours.  You have cold, clammy skin.  You keep throwing up after 24 to 48 hours.  You have a fever. MAKE SURE YOU:   Understand these instructions.  Will watch your  condition.  Will get help right away if you are not doing well or get worse.   This information is not intended to replace advice given to you by your health care provider. Make sure you discuss any questions you have with your health care provider.   Document Released: 11/19/2007 Document Revised: 08/25/2011 Document Reviewed: 11/01/2010 Elsevier Interactive Patient Education [employer]

## 2015-08-02 NOTE — ED Notes (Signed)
Having abdominal pain since yesterday.  rates pain 10/10.  Vomited about 2-3 times this am.  Last BM this am, stool loose.

## 2015-10-13 ENCOUNTER — Emergency Department (HOSPITAL_COMMUNITY): Payer: No Typology Code available for payment source

## 2015-10-13 ENCOUNTER — Emergency Department (HOSPITAL_COMMUNITY)
Admission: EM | Admit: 2015-10-13 | Discharge: 2015-10-13 | Disposition: A | Discharge status: Home / Self Care | Payer: No Typology Code available for payment source | Attending: Emergency Medicine | Admitting: Emergency Medicine

## 2015-10-13 ENCOUNTER — Encounter (HOSPITAL_COMMUNITY): Payer: Self-pay | Admitting: Emergency Medicine

## 2015-10-13 DIAGNOSIS — S40012A Contusion of left shoulder, initial encounter: Secondary | ICD-10-CM | POA: Insufficient documentation

## 2015-10-13 DIAGNOSIS — M549 Dorsalgia, unspecified: Secondary | ICD-10-CM | POA: Diagnosis not present

## 2015-10-13 DIAGNOSIS — Z79899 Other long term (current) drug therapy: Secondary | ICD-10-CM | POA: Insufficient documentation

## 2015-10-13 DIAGNOSIS — I10 Essential (primary) hypertension: Secondary | ICD-10-CM | POA: Insufficient documentation

## 2015-10-13 DIAGNOSIS — Y929 Unspecified place or not applicable: Secondary | ICD-10-CM | POA: Insufficient documentation

## 2015-10-13 DIAGNOSIS — Y9389 Activity, other specified: Secondary | ICD-10-CM | POA: Diagnosis not present

## 2015-10-13 DIAGNOSIS — Y999 Unspecified external cause status: Secondary | ICD-10-CM | POA: Diagnosis not present

## 2015-10-13 DIAGNOSIS — S299XXA Unspecified injury of thorax, initial encounter: Secondary | ICD-10-CM | POA: Diagnosis present

## 2015-10-13 DIAGNOSIS — S20212A Contusion of left front wall of thorax, initial encounter: Secondary | ICD-10-CM | POA: Insufficient documentation

## 2015-10-13 HISTORY — DX: Gastro-esophageal reflux disease without esophagitis: K21.9

## 2015-10-13 MED ORDER — TRAMADOL HCL 50 MG PO TABS
50.0000 mg | ORAL_TABLET | Freq: Once | ORAL | Status: AC
  Administered 2015-10-13: 50 mg via ORAL
  Filled 2015-10-13: qty 1

## 2015-10-13 MED ORDER — IBUPROFEN 800 MG PO TABS
800.0000 mg | ORAL_TABLET | Freq: Once | ORAL | Status: AC
  Administered 2015-10-13: 800 mg via ORAL
  Filled 2015-10-13: qty 1

## 2015-10-13 MED ORDER — NAPROXEN 500 MG PO TABS: ORAL_TABLET | ORAL | Status: DC

## 2015-10-13 MED ORDER — CYCLOBENZAPRINE HCL 5 MG PO TABS: 5.0000 mg | ORAL_TABLET | Freq: Three times a day (TID) | ORAL | Status: DC | PRN
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
## 2015-11-06 ENCOUNTER — Emergency Department (HOSPITAL_COMMUNITY)
Admission: EM | Admit: 2015-11-06 | Discharge: 2015-11-06 | Disposition: A | Discharge status: Home / Self Care | Payer: Medicaid Other | Attending: Emergency Medicine | Admitting: Emergency Medicine

## 2015-11-06 ENCOUNTER — Encounter (HOSPITAL_COMMUNITY): Payer: Self-pay | Admitting: Emergency Medicine

## 2015-11-06 DIAGNOSIS — N390 Urinary tract infection, site not specified: Secondary | ICD-10-CM | POA: Insufficient documentation

## 2015-11-06 DIAGNOSIS — Z79899 Other long term (current) drug therapy: Secondary | ICD-10-CM | POA: Insufficient documentation

## 2015-11-06 DIAGNOSIS — I10 Essential (primary) hypertension: Secondary | ICD-10-CM | POA: Diagnosis not present

## 2015-11-06 DIAGNOSIS — R300 Dysuria: Secondary | ICD-10-CM | POA: Diagnosis present

## 2015-11-06 LAB — URINE MICROSCOPIC-ADD ON

## 2015-11-06 LAB — URINALYSIS, ROUTINE W REFLEX MICROSCOPIC
Bilirubin Urine: NEGATIVE
GLUCOSE, UA: NEGATIVE mg/dL
HGB URINE DIPSTICK: NEGATIVE
KETONES UR: NEGATIVE mg/dL
Nitrite: NEGATIVE
PROTEIN: NEGATIVE mg/dL
Specific Gravity, Urine: <1.005 — ABNORMAL LOW (ref 1.005–1.030)
pH: 5.5 (ref 5.0–8.0)

## 2015-11-06 MED ORDER — CEPHALEXIN 500 MG PO CAPS: 500.0000 mg | ORAL_CAPSULE | Freq: Four times a day (QID) | ORAL | Status: DC

## 2015-11-06 MED ORDER — CEPHALEXIN 500 MG PO CAPS
500.0000 mg | ORAL_CAPSULE | Freq: Once | ORAL | Status: AC
  Administered 2015-11-06: 500 mg via ORAL
  Filled 2015-11-06: qty 1

## 2015-11-06 NOTE — ED Notes (Signed)
Pt c/o penis irritation and swelling per pt.

## 2015-11-06 NOTE — ED Provider Notes (Signed)
CSN: [id number]     Arrival date & time 11/06/15  0210 History   First MD Initiated Contact with Patient 11/06/15 0236     No chief complaint on file.    (Consider location/radiation/quality/duration/timing/severity/associated sxs/prior Treatment) Patient is a 25 y.o. male presenting with dysuria.  Dysuria This is a new problem. The current episode started 12 to 24 hours ago. The problem occurs constantly. The problem has not changed since onset.Pertinent negatives include no chest pain, no headaches and no shortness of breath. Nothing aggravates the symptoms. Nothing relieves the symptoms. He has tried nothing for the symptoms.    Past Medical History  Diagnosis Date  . Hypertension   . GERD (gastroesophageal reflux disease)    Past Surgical History  Procedure Laterality Date  . Tonsillectomy and adenoidectomy     History reviewed. No pertinent family history. Social History  Substance Use Topics  . Smoking status: Never Smoker   . Smokeless tobacco: None  . Alcohol Use: Yes    Review of Systems  Constitutional: Negative for fever and chills.  Respiratory: Negative for shortness of breath.   Cardiovascular: Negative for chest pain.  Genitourinary: Positive for dysuria and penile pain. Negative for urgency, discharge, penile swelling and scrotal swelling.  Neurological: Negative for headaches.  All other systems reviewed and are negative.     Allergies  Penicillins  Home Medications   Prior to Admission medications   Medication Sig Start Date End Date Taking? Authorizing Provider  Chlorphen-PE-Acetaminophen (ALLERGY MULTI-SYMPTOM DAYTIME PO) Take 1 tablet by mouth.   Yes Historical Provider, MD  cyclobenzaprine (FLEXERIL) 5 MG tablet Take 1 tablet (5 mg total) by mouth 3 (three) times daily as needed (muscle soreness). 10/13/15  Yes Devoria Albe, MD  naproxen (NAPROSYN) 500 MG tablet Take 1 po BID with food prn pain 10/13/15  Yes Devoria Albe, MD  omeprazole (PRILOSEC) 20  MG capsule Take 1 capsule (20 mg total) by mouth 2 (two) times daily before a meal. 08/21/14  Yes Azalia Bilis, MD  ondansetron (ZOFRAN) 8 MG tablet Take 1 tablet (8 mg total) by mouth every 8 (eight) hours as needed for nausea or vomiting. 08/02/15  Yes Mancel Bale, MD  Ranitidine HCl (ACID REDUCER PO) Take 1-2 tablets by mouth once as needed (for stomach upset). Reported on 08/02/2015   Yes Historical Provider, MD  sodium chloride (OCEAN) 0.65 % SOLN nasal spray Place 1 spray into both nostrils as needed for congestion.   Yes Historical Provider, MD  cephALEXin (KEFLEX) 500 MG capsule Take 1 capsule (500 mg total) by mouth 4 (four) times daily. 11/06/15   Barbara Cower Kaleth Koy, MD   BP 138/81 mmHg  Pulse 60  Temp(Src) 97.9 F (36.6 C)  Resp 20  Ht  (1.753 m)  Wt 225 lb (102.059 kg)  BMI 33.21 kg/m2  SpO2 97% Physical Exam  Constitutional: He is oriented to person, place, and time. He appears well-developed and well-nourished.  HENT:  Head: Normocephalic and atraumatic.  Neck: Normal range of motion.  Cardiovascular: Normal rate and regular rhythm.   Pulmonary/Chest: Effort normal. No respiratory distress.  Abdominal: Soft. He exhibits no distension. There is no tenderness.  Genitourinary: Penile erythema (around urethra) present.  Musculoskeletal: Normal range of motion. He exhibits no edema or tenderness.  Neurological: He is alert and oriented to person, place, and time.  Skin: Skin is warm and dry. No rash noted. No erythema.  Nursing note and vitals reviewed.   ED Course  Procedures (including  critical care time) Labs Review Labs Reviewed  URINALYSIS, ROUTINE W REFLEX MICROSCOPIC (NOT AT [facility]) - Abnormal; Notable for the following:    Color, Urine STRAW (*)    Specific Gravity, Urine <1.005 (*)    Leukocytes, UA TRACE (*)    All other components within normal limits  URINE MICROSCOPIC-ADD ON - Abnormal; Notable for the following:    Squamous Epithelial / LPF 0-5 (*)     Bacteria, UA RARE (*)    All other components within normal limits  GC/CHLAMYDIA PROBE AMP ([place]) NOT AT [facility]    Imaging Review No results found. I have personally reviewed and evaluated these images and lab results as part of my medical decision-making.   EKG Interpretation None      MDM   Final diagnoses:  UTI (lower urinary tract infection)    Urethritis likely from UTI, however STD swabs sent. Patient less concerned about STD, so no ppx tx given. No rectal pain or pain with defecation to suggest prostatitis.   New Prescriptions: New Prescriptions   CEPHALEXIN (KEFLEX) 500 MG CAPSULE    Take 1 capsule (500 mg total) by mouth 4 (four) times daily.    I have personally and contemperaneously reviewed labs and imaging and used in my decision making as above.   A medical screening exam was performed and I feel the patient has had an appropriate workup for their chief complaint at this time and likelihood of emergent condition existing is low and thus workup can continue on an outpatient basis.. Their vital signs are stable. They have been counseled on decision, discharge, follow up and which symptoms necessitate immediate return to the emergency department.  They verbally stated understanding and agreement with plan and discharged in stable condition.      Marily MemosJason Makaleigh Reinard, MD 11/06/15 [phone]

## 2015-11-07 LAB — GC/CHLAMYDIA PROBE AMP (~~LOC~~) NOT AT ARMC
Chlamydia: NEGATIVE
Neisseria Gonorrhea: NEGATIVE

## 2015-12-14 IMAGING — CR DG CHEST 2V
2 series · 2 of 2 positions shown · non-contrast
Comparison: None

CLINICAL DATA: Chest pain

EXAM:
CHEST  2 VIEW

[view not recorded (1 of 2)]
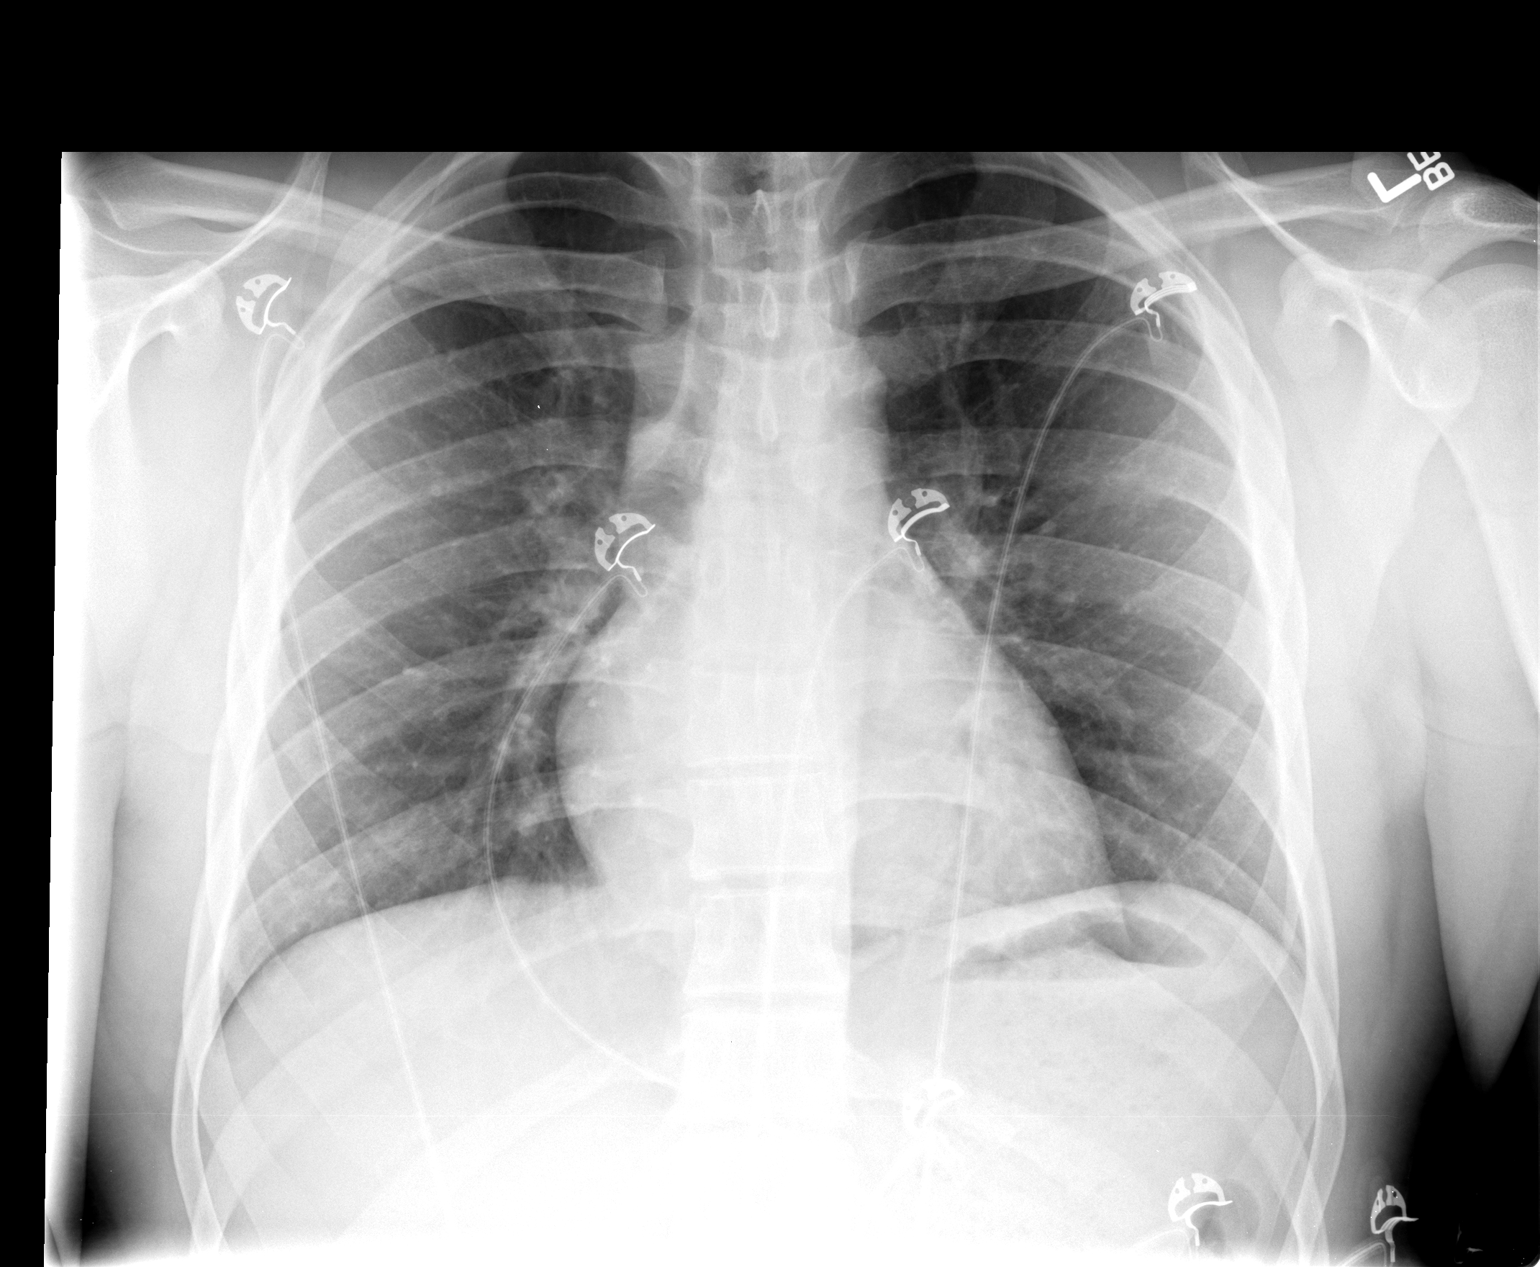

[view not recorded (2 of 2)]
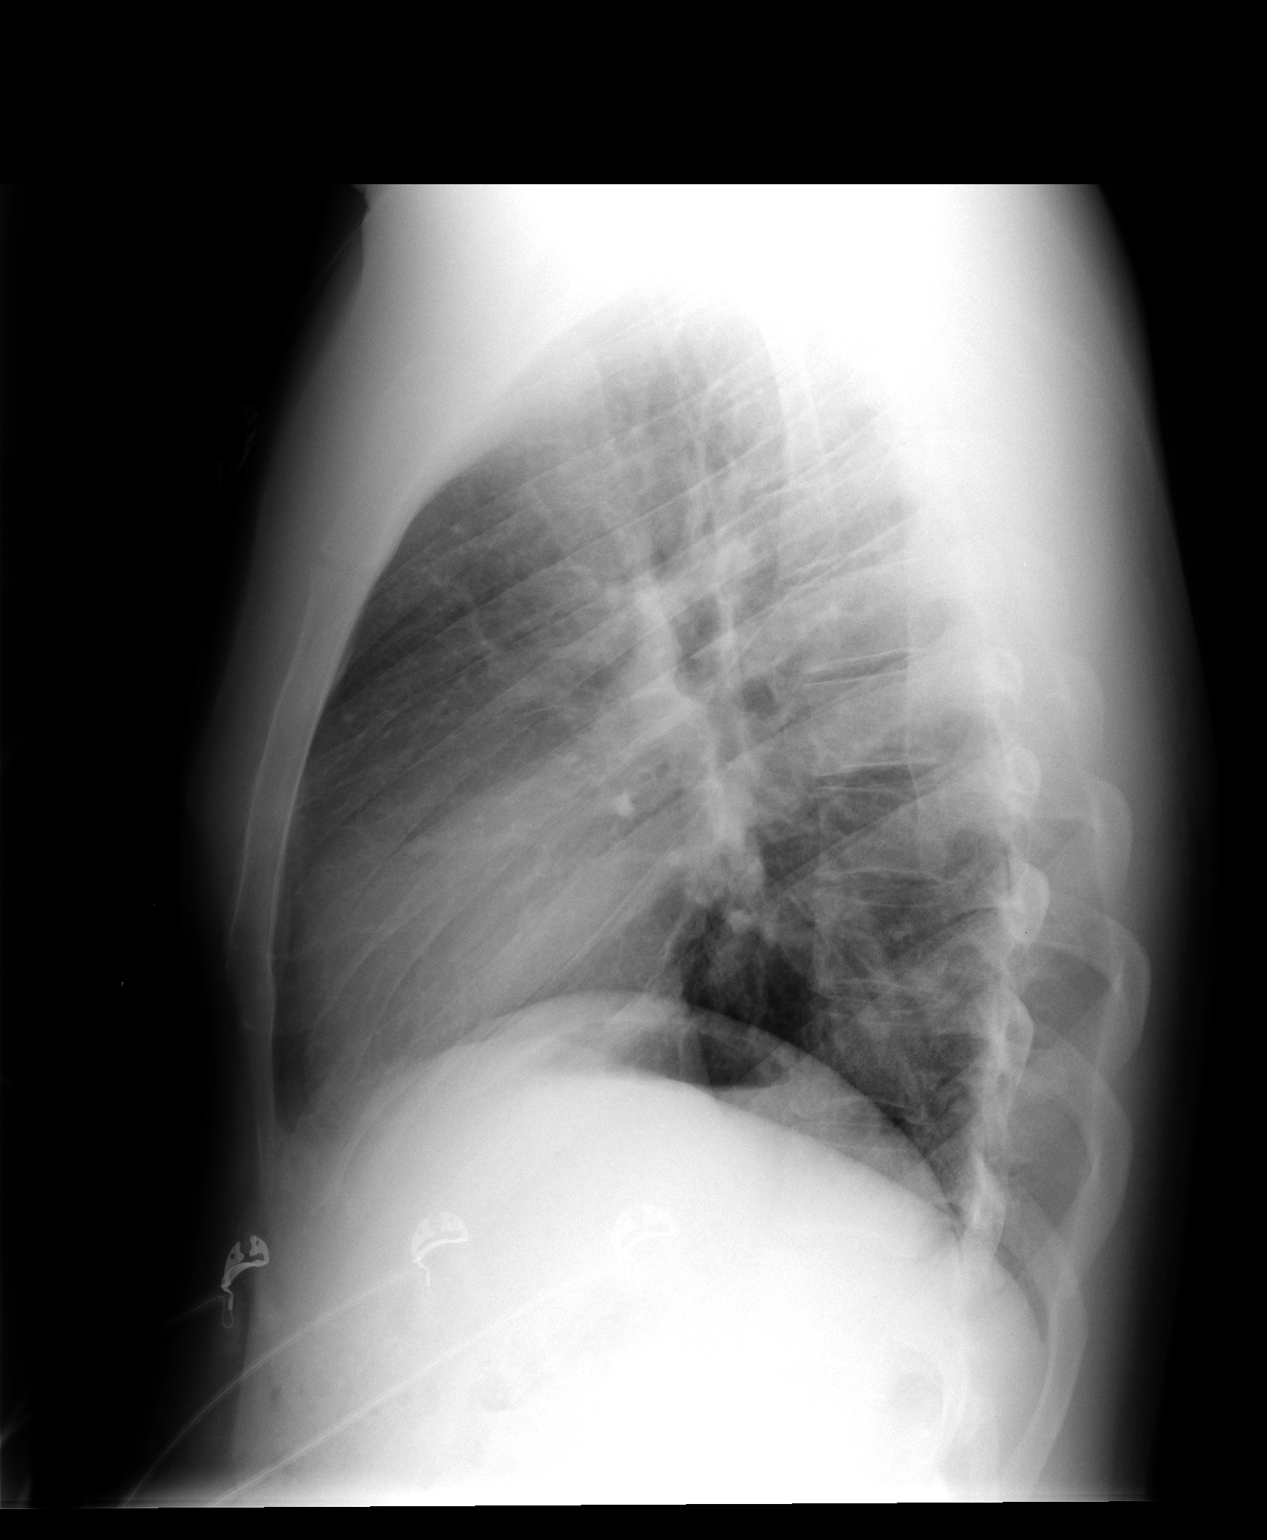

[2 of 2 positions shown; findings below may reference images not displayed]

FINDINGS: The heart size and mediastinal contours are within normal limits.
Both lungs are clear. The visualized skeletal structures are
unremarkable.
IMPRESSION: No active cardiopulmonary disease.

## 2016-02-08 ENCOUNTER — Emergency Department (HOSPITAL_COMMUNITY)
Admission: EM | Admit: 2016-02-08 | Discharge: 2016-02-08 | Disposition: A | Discharge status: Home / Self Care | Payer: Medicaid Other | Attending: Emergency Medicine | Admitting: Emergency Medicine

## 2016-02-08 ENCOUNTER — Encounter (HOSPITAL_COMMUNITY): Payer: Self-pay

## 2016-02-08 DIAGNOSIS — R21 Rash and other nonspecific skin eruption: Secondary | ICD-10-CM | POA: Diagnosis present

## 2016-02-08 DIAGNOSIS — Z79899 Other long term (current) drug therapy: Secondary | ICD-10-CM | POA: Insufficient documentation

## 2016-02-08 DIAGNOSIS — I10 Essential (primary) hypertension: Secondary | ICD-10-CM | POA: Diagnosis not present

## 2016-02-08 NOTE — Discharge Instructions (Signed)
You may use over-the-counter Benadryl 50 mg every 8 hours as needed for itching. You may use over-the-counter hydrocortisone ointment twice a day to these areas.

## 2016-02-08 NOTE — ED Provider Notes (Signed)
TIME SEEN: 4:05 AM  CHIEF COMPLAINT: Rash  HPI: Pt is a 25 y.o. male with history of hypertension and GERD who presents to the emergency department with a rash to his back. Reports he's noticed 2 rays. Areas to his back approximate 8 hours ago. Thinks he may have been bit by an insect but does not remember the bite did not see an insect. No rash on his palms, soles or mucous membranes. No fever. No tick bite. Has been trying witch hazel and alcohol wipes. States he is here because he once to "know what it is that bit him". Denies any new exposure. No shortness of breath, wheezing, lip or tongue swelling.  ROS: See HPI Constitutional: no fever  Eyes: no drainage  ENT: no runny nose   Cardiovascular:  no chest pain  Resp: no SOB  GI: no vomiting GU: no dysuria Integumentary: no rash  Allergy: no hives  Musculoskeletal: no leg swelling  Neurological: no slurred speech ROS otherwise negative  PAST MEDICAL HISTORY/PAST SURGICAL HISTORY:  Past Medical History:  Diagnosis Date  . GERD (gastroesophageal reflux disease)   . Hypertension     MEDICATIONS:  Prior to Admission medications   Medication Sig Start Date End Date Taking? Authorizing Provider  cephALEXin (KEFLEX) 500 MG capsule Take 1 capsule (500 mg total) by mouth 4 (four) times daily. 11/06/15   Marily MemosJason Mesner, MD  Chlorphen-PE-Acetaminophen (ALLERGY MULTI-SYMPTOM DAYTIME PO) Take 1 tablet by mouth.    Historical Provider, MD  cyclobenzaprine (FLEXERIL) 5 MG tablet Take 1 tablet (5 mg total) by mouth 3 (three) times daily as needed (muscle soreness). 10/13/15   Devoria AlbeIva Knapp, MD  naproxen (NAPROSYN) 500 MG tablet Take 1 po BID with food prn pain 10/13/15   Devoria AlbeIva Knapp, MD  omeprazole (PRILOSEC) 20 MG capsule Take 1 capsule (20 mg total) by mouth 2 (two) times daily before a meal. 08/21/14   Azalia BilisKevin Campos, MD  ondansetron (ZOFRAN) 8 MG tablet Take 1 tablet (8 mg total) by mouth every 8 (eight) hours as needed for nausea or vomiting. 08/02/15    Mancel BaleElliott Wentz, MD  Ranitidine HCl (ACID REDUCER PO) Take 1-2 tablets by mouth once as needed (for stomach upset). Reported on 08/02/2015    Historical Provider, MD  sodium chloride (OCEAN) 0.65 % SOLN nasal spray Place 1 spray into both nostrils as needed for congestion.    Historical Provider, MD    ALLERGIES:  Allergies  Allergen Reactions  . Penicillins Swelling    SOCIAL HISTORY:  Social History  Substance Use Topics  . Smoking status: Never Smoker  . Smokeless tobacco: Not on file  . Alcohol use Yes    FAMILY HISTORY: No family history on file.  EXAM: BP 130/68 (BP Location: Left Arm)   Pulse (!) 49   Temp 97.8 F (36.6 C) (Oral)   Resp 18   Ht 5\' 9"  (1.753 m)   Wt 220 lb (99.8 kg)   SpO2 100%   BMI 32.49 kg/m  CONSTITUTIONAL: Alert and oriented and responds appropriately to questions. Well-appearing; well-nourished HEAD: Normocephalic EYES: Conjunctivae clear, PERRL ENT: normal nose; no rhinorrhea; moist mucous membranes; No pharyngeal erythema or petechiae, no tonsillar hypertrophy or exudate, no uvular deviation, no trismus or drooling, normal phonation, no stridor, no dental caries present, no drainable dental abscess noted, no Ludwig's angina, tongue sits flat in the bottom of the mouth, no angioedema, no facial erythema or warmth, no facial swelling NECK: Supple, no meningismus, no LAD  CARD:  RRR; S1 and S2 appreciated; no murmurs, no clicks, no rubs, no gallops RESP: Normal chest excursion without splinting or tachypnea; breath sounds clear and equal bilaterally; no wheezes, no rhonchi, no rales, no hypoxia or respiratory distress, speaking full sentences ABD/GI: Normal bowel sounds; non-distended; soft, non-tender, no rebound, no guarding, no peritoneal signs BACK:  The back appears normal and is non-tender to palpation, there is no CVA tenderness EXT: Normal ROM in all joints; non-tender to palpation; no edema; normal capillary refill; no cyanosis, no calf  tenderness or swelling    SKIN: Normal color for age and race; warm;  Two 2-3cm raised erythematous lesions to the back with areas of excoriation. No vesicles. No blisters or desquamation. No petechiae or purpura. No rash involving the palms, soles or mucous membranes. No urticaria. NEURO: Moves all extremities equally, sensation to light touch intact diffusely, cranial nerves II through XII intact PSYCH: The patient's mood and manner are appropriate. Grooming and personal hygiene are appropriate.  MEDICAL DECISION MAKING: Patient here with what appears to be an insect bite. No sign of any life-threatening rash. Have advised him to use Benadryl as needed for itching and hydrocortisone cream over-the-counter on these areas. I do not feel he has any sign of infection and needs antibiotics. He does not need epinephrine. He denies any new soaps, lotions, detergents, medications. I feel he is safe for discharge.  At this time, I do not feel there is any life-threatening condition present. I have reviewed and discussed all results (EKG, imaging, lab, urine as appropriate), exam findings with patient/family. I have reviewed nursing notes and appropriate previous records.  I feel the patient is safe to be discharged home without further emergent workup and can continue workup as an outpatient as needed. Discussed usual and customary return precautions. Patient/family verbalize understanding and are comfortable with this plan.  Outpatient follow-up has been provided. All questions have been answered.      Layla Maw Tacie Mccuistion, DO 02/08/16 [phone]

## 2016-02-08 NOTE — ED Triage Notes (Signed)
Pt has a couple of whelts to his back where he wonders if he was bitten by something.  Pt denies pain, states "I just want to know what it is"

## 2016-05-11 ENCOUNTER — Encounter (HOSPITAL_COMMUNITY): Payer: Self-pay | Admitting: *Deleted

## 2016-05-11 ENCOUNTER — Emergency Department (HOSPITAL_COMMUNITY)
Admission: EM | Admit: 2016-05-11 | Discharge: 2016-05-11 | Disposition: A | Discharge status: Home / Self Care | Payer: Medicaid Other | Attending: Emergency Medicine | Admitting: Emergency Medicine

## 2016-05-11 DIAGNOSIS — M7552 Bursitis of left shoulder: Secondary | ICD-10-CM | POA: Insufficient documentation

## 2016-05-11 DIAGNOSIS — I10 Essential (primary) hypertension: Secondary | ICD-10-CM | POA: Insufficient documentation

## 2016-05-11 DIAGNOSIS — Z79899 Other long term (current) drug therapy: Secondary | ICD-10-CM | POA: Insufficient documentation

## 2016-05-11 MED ORDER — METHOCARBAMOL 500 MG PO TABS
500.0000 mg | ORAL_TABLET | Freq: Once | ORAL | Status: AC
  Administered 2016-05-11: 500 mg via ORAL
  Filled 2016-05-11: qty 1

## 2016-05-11 MED ORDER — IBUPROFEN 800 MG PO TABS
800.0000 mg | ORAL_TABLET | Freq: Once | ORAL | Status: AC
  Administered 2016-05-11: 800 mg via ORAL
  Filled 2016-05-11: qty 1

## 2016-05-11 MED ORDER — IBUPROFEN 800 MG PO TABS: 800.0000 mg | ORAL_TABLET | Freq: Three times a day (TID) | ORAL | 0 refills | Status: DC

## 2016-05-11 MED ORDER — METHOCARBAMOL 500 MG PO TABS: 500.0000 mg | ORAL_TABLET | Freq: Three times a day (TID) | ORAL | 0 refills | Status: DC

## 2016-05-11 NOTE — Discharge Instructions (Signed)
Apply ice packs on/off to your shoulder.  Call the orthopedic doctor listed to arrange a follow-up if not improving
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
## 2016-05-11 NOTE — ED Notes (Signed)
ED Provider at bedside. 

## 2016-07-05 ENCOUNTER — Emergency Department (HOSPITAL_COMMUNITY)
Admission: EM | Admit: 2016-07-05 | Discharge: 2016-07-05 | Disposition: A | Discharge status: Home / Self Care | Payer: Medicaid Other | Attending: Emergency Medicine | Admitting: Emergency Medicine

## 2016-07-05 ENCOUNTER — Emergency Department (HOSPITAL_COMMUNITY): Payer: Medicaid Other

## 2016-07-05 ENCOUNTER — Encounter (HOSPITAL_COMMUNITY): Payer: Self-pay | Admitting: Emergency Medicine

## 2016-07-05 DIAGNOSIS — Y929 Unspecified place or not applicable: Secondary | ICD-10-CM | POA: Insufficient documentation

## 2016-07-05 DIAGNOSIS — I10 Essential (primary) hypertension: Secondary | ICD-10-CM | POA: Insufficient documentation

## 2016-07-05 DIAGNOSIS — Y9367 Activity, basketball: Secondary | ICD-10-CM | POA: Insufficient documentation

## 2016-07-05 DIAGNOSIS — X501XXA Overexertion from prolonged static or awkward postures, initial encounter: Secondary | ICD-10-CM | POA: Insufficient documentation

## 2016-07-05 DIAGNOSIS — Z791 Long term (current) use of non-steroidal anti-inflammatories (NSAID): Secondary | ICD-10-CM | POA: Insufficient documentation

## 2016-07-05 DIAGNOSIS — Y999 Unspecified external cause status: Secondary | ICD-10-CM | POA: Insufficient documentation

## 2016-07-05 DIAGNOSIS — Z79899 Other long term (current) drug therapy: Secondary | ICD-10-CM | POA: Insufficient documentation

## 2016-07-05 DIAGNOSIS — S93402A Sprain of unspecified ligament of left ankle, initial encounter: Secondary | ICD-10-CM

## 2016-07-05 MED ORDER — IBUPROFEN 800 MG PO TABS
800.0000 mg | ORAL_TABLET | Freq: Once | ORAL | Status: AC
  Administered 2016-07-05: 800 mg via ORAL
  Filled 2016-07-05: qty 1

## 2016-07-05 MED ORDER — IBUPROFEN 600 MG PO TABS: 600.0000 mg | ORAL_TABLET | Freq: Four times a day (QID) | ORAL | 0 refills | Status: DC | PRN

## 2016-07-05 MED ORDER — ACETAMINOPHEN 325 MG PO TABS
650.0000 mg | ORAL_TABLET | Freq: Once | ORAL | Status: AC
  Administered 2016-07-05: 650 mg via ORAL
  Filled 2016-07-05: qty 2

## 2016-07-05 NOTE — ED Triage Notes (Signed)
Pt states he rolled his left ankle playing basketball this morning. Pt has tylenol(1330) and put an ankle brace on with no relief.

## 2016-07-05 NOTE — ED Provider Notes (Signed)
[facility] DEPT Provider Note   CSN: [id number] Arrival date & time: 07/05/16  1836     History   Chief Complaint Chief Complaint  Patient presents with  . Ankle Pain    HPI Matthew Alvarez is a 26 y.o. male.  Patient is a 26 year old male who presents to the emergency department with a complaint of left ankle pain.  The patient states that he was playing basketball earlier this morning. He made a wrong step and twisted his left ankle. He began having pain and swelling. He took Tylenol approximately 1:30 in the evening, he applied an ankle brace, he continued to have pain. He has not had any previous operations or procedures involving the left ankle. The pain is worse when he attempts to put weight on it or when he moves in certain positions. He presents now for assistance with this issue.      Past Medical History:  Diagnosis Date  . GERD (gastroesophageal reflux disease)   . Hypertension     Patient Active Problem List   Diagnosis Date Noted  . CLOSED FRACTURE OF NECK OF RADIUS 07/09/2010    Past Surgical History:  Procedure Laterality Date  . TONSILLECTOMY AND ADENOIDECTOMY         Home Medications    Prior to Admission medications   Medication Sig Start Date End Date Taking? Authorizing Provider  ferrous sulfate 325 (65 FE) MG EC tablet Take 650 mg by mouth daily.    Historical Provider, MD  ibuprofen (ADVIL,MOTRIN) 800 MG tablet Take 1 tablet (800 mg total) by mouth 3 (three) times daily. 05/11/16   Tammy Triplett, PA-C  methocarbamol (ROBAXIN) 500 MG tablet Take 1 tablet (500 mg total) by mouth 3 (three) times daily. 05/11/16   Tammy Triplett, PA-C  naproxen (NAPROSYN) 500 MG tablet Take 1 po BID with food prn pain 10/13/15   Devoria Albe, MD  omeprazole (PRILOSEC) 20 MG capsule Take 1 capsule (20 mg total) by mouth 2 (two) times daily before a meal. 08/21/14   Azalia Bilis, MD    Family History No family history on file.  Social History Social History    Substance Use Topics  . Smoking status: Never Smoker  . Smokeless tobacco: Never Used  . Alcohol use Yes     Comment: occ. use     Allergies   Penicillins   Review of Systems Review of Systems  Constitutional: Negative for activity change.       All ROS Neg except as noted in HPI  HENT: Negative for nosebleeds.   Eyes: Negative for photophobia and discharge.  Respiratory: Negative for cough, shortness of breath and wheezing.   Cardiovascular: Negative for chest pain and palpitations.  Gastrointestinal: Negative for abdominal pain and blood in stool.  Genitourinary: Negative for dysuria, frequency and hematuria.  Musculoskeletal: Negative for arthralgias, back pain and neck pain.  Skin: Negative.   Neurological: Negative for dizziness, seizures and speech difficulty.  Psychiatric/Behavioral: Negative for confusion and hallucinations.     Physical Exam Updated Vital Signs BP 141/72 (BP Location: Left Arm)   Pulse (!) 55   Temp 97.7 F (36.5 C) (Oral)   Resp 18   Ht 5\' 9"  (1.753 m)   Wt 99.8 kg   SpO2 100%   BMI 32.49 kg/m   Physical Exam  Constitutional: He is oriented to person, place, and time. He appears well-developed and well-nourished.  Non-toxic appearance.  HENT:  Head: Normocephalic.  Right Ear: Tympanic membrane and  external ear normal.  Left Ear: Tympanic membrane and external ear normal.  Eyes: EOM and lids are normal. Pupils are equal, round, and reactive to light.  Neck: Normal range of motion. Neck supple. Carotid bruit is not present.  Cardiovascular: Normal rate, regular rhythm, normal heart sounds, intact distal pulses and normal pulses.   Pulmonary/Chest: Breath sounds normal. No respiratory distress.  Abdominal: Soft. Bowel sounds are normal. There is no tenderness. There is no guarding.  Musculoskeletal:       Left ankle: He exhibits decreased range of motion and swelling. Tenderness. Lateral malleolus tenderness found. Achilles tendon  normal.  Lymphadenopathy:       Head (right side): No submandibular adenopathy present.       Head (left side): No submandibular adenopathy present.    He has no cervical adenopathy.  Neurological: He is alert and oriented to person, place, and time. He has normal strength. No cranial nerve deficit or sensory deficit.  Skin: Skin is warm and dry.  Psychiatric: He has a normal mood and affect. His speech is normal.  Nursing note and vitals reviewed.    ED Treatments / Results  Labs (all labs ordered are listed, but only abnormal results are displayed) Labs Reviewed - No data to display  EKG  EKG Interpretation None       Radiology Dg Ankle Complete Left  Result Date: 07/05/2016 CLINICAL DATA:  Twisted left ankle, basketball injury today EXAM: LEFT ANKLE COMPLETE - 3+ VIEW COMPARISON:  01/05/2014 FINDINGS: Three views of the left ankle submitted. No acute fracture or subluxation. Ankle mortise is preserved. No radiopaque foreign body. IMPRESSION: Negative. Electronically Signed   By: Natasha MeadLiviu  Pop M.D.   On: 07/05/2016 19:35    Procedures Procedures (including critical care time)  Medications Ordered in ED Medications - No data to display   Initial Impression / Assessment and Plan / ED Course  I have reviewed the triage vital signs and the nursing notes.  Pertinent labs & imaging results that were available during my care of the patient were reviewed by me and considered in my medical decision making (see chart for details).     **I have reviewed nursing notes, vital signs, and all appropriate lab and imaging results for this patient.*  Final Clinical Impressions(s) / ED Diagnoses MDM Patient presents to the emergency department with complaint of ankle pain on the left. He injured it while playing basketball. X-ray of the ankle is negative for fracture or dislocation. The ankle mortise appears to be intact. There no neurovascular deficits appreciated on examination at this  time. I suspect the patient has a strain/sprain of the ankle. Patient will be fitted with an ankle stirrup splint. He will be treated with ibuprofen and Tylenol every 6 hours. We discussed the importance of ice, as well as elevation. The patient knowledge is understanding of the discharge instructions. He will see Dr. Romeo AppleHarrison for orthopedic evaluation and management if not improving.    Final diagnoses:  None    New Prescriptions New Prescriptions   No medications on file     Ivery QualeHobson Lachell Rochette, Cordelia Poche-C 07/05/16 2039    Nira ConnPedro Eduardo Cardama, MD 07/05/16 2244

## 2016-07-05 NOTE — Discharge Instructions (Addendum)
Vital signs within normal limits. Your x-ray is negative for fracture or dislocation. Your examination suggest sprain of your ankle. Please use the ankle stirrup splint over the next 5-7 days. Use 600 mg of ibuprofen, and 500 mg of Tylenol every 6 hours for pain and discomfort. Please see Dr. Romeo AppleHarrison for orthopedic evaluation if not improving.

## 2016-08-27 ENCOUNTER — Encounter (HOSPITAL_COMMUNITY): Payer: Self-pay | Admitting: *Deleted

## 2016-08-27 ENCOUNTER — Emergency Department (HOSPITAL_COMMUNITY): Payer: BLUE CROSS/BLUE SHIELD

## 2016-08-27 ENCOUNTER — Emergency Department (HOSPITAL_COMMUNITY)
Admission: EM | Admit: 2016-08-27 | Discharge: 2016-08-27 | Disposition: A | Discharge status: Home / Self Care | Payer: BLUE CROSS/BLUE SHIELD | Attending: Emergency Medicine | Admitting: Emergency Medicine

## 2016-08-27 DIAGNOSIS — I10 Essential (primary) hypertension: Secondary | ICD-10-CM | POA: Insufficient documentation

## 2016-08-27 DIAGNOSIS — R1013 Epigastric pain: Secondary | ICD-10-CM | POA: Insufficient documentation

## 2016-08-27 DIAGNOSIS — R0602 Shortness of breath: Secondary | ICD-10-CM | POA: Diagnosis not present

## 2016-08-27 DIAGNOSIS — Z79899 Other long term (current) drug therapy: Secondary | ICD-10-CM | POA: Diagnosis not present

## 2016-08-27 DIAGNOSIS — R0789 Other chest pain: Secondary | ICD-10-CM | POA: Diagnosis present

## 2016-08-27 DIAGNOSIS — R079 Chest pain, unspecified: Secondary | ICD-10-CM

## 2016-08-27 LAB — CBC WITH DIFFERENTIAL/PLATELET
BASOS PCT: 0 %
Basophils Absolute: 0 10*3/uL (ref 0.0–0.1)
EOS ABS: 0.2 10*3/uL (ref 0.0–0.7)
Eosinophils Relative: 2 %
HCT: 42.5 % (ref 39.0–52.0)
HEMOGLOBIN: 14.4 g/dL (ref 13.0–17.0)
LYMPHS ABS: 3.9 10*3/uL (ref 0.7–4.0)
Lymphocytes Relative: 40 %
MCH: 28.1 pg (ref 26.0–34.0)
MCHC: 33.9 g/dL (ref 30.0–36.0)
MCV: 83 fL (ref 78.0–100.0)
MONO ABS: 0.5 10*3/uL (ref 0.1–1.0)
MONOS PCT: 5 %
Neutro Abs: 5.3 10*3/uL (ref 1.7–7.7)
Neutrophils Relative %: 53 %
Platelets: 269 10*3/uL (ref 150–400)
RBC: 5.12 MIL/uL (ref 4.22–5.81)
RDW: 13 % (ref 11.5–15.5)
WBC: 9.9 10*3/uL (ref 4.0–10.5)

## 2016-08-27 LAB — I-STAT TROPONIN, ED: TROPONIN I, POC: 0 ng/mL (ref 0.00–0.08)

## 2016-08-27 LAB — COMPREHENSIVE METABOLIC PANEL
ALBUMIN: 4.2 g/dL (ref 3.5–5.0)
ALK PHOS: 88 U/L (ref 38–126)
ALT: 22 U/L (ref 17–63)
AST: 27 U/L (ref 15–41)
Anion gap: 6 (ref 5–15)
BUN: 11 mg/dL (ref 6–20)
CHLORIDE: 101 mmol/L (ref 101–111)
CO2: 28 mmol/L (ref 22–32)
CREATININE: 0.93 mg/dL (ref 0.61–1.24)
Calcium: 8.8 mg/dL — ABNORMAL LOW (ref 8.9–10.3)
GFR calc Af Amer: >60 mL/min (ref >60)
GFR calc non Af Amer: >60 mL/min (ref >60)
GLUCOSE: 83 mg/dL (ref 65–99)
Potassium: 3.5 mmol/L (ref 3.5–5.1)
Sodium: 135 mmol/L (ref 135–145)
Total Bilirubin: 0.6 mg/dL (ref 0.3–1.2)
Total Protein: 7.6 g/dL (ref 6.5–8.1)

## 2016-08-27 LAB — LIPASE, BLOOD: Lipase: 19 U/L (ref 11–51)

## 2016-08-27 MED ORDER — IBUPROFEN 400 MG PO TABS: 400.0000 mg | ORAL_TABLET | Freq: Four times a day (QID) | ORAL | 0 refills | Status: AC

## 2016-08-27 MED ORDER — KETOROLAC TROMETHAMINE 30 MG/ML IJ SOLN
15.0000 mg | Freq: Once | INTRAMUSCULAR | Status: AC
  Administered 2016-08-27: 15 mg via INTRAVENOUS
  Filled 2016-08-27: qty 1

## 2016-08-27 MED ORDER — HYDROCODONE-ACETAMINOPHEN 5-325 MG PO TABS
1.0000 | ORAL_TABLET | Freq: Once | ORAL | Status: AC
  Administered 2016-08-27: 1 via ORAL
  Filled 2016-08-27: qty 1

## 2016-08-27 NOTE — ED Provider Notes (Signed)
[facility] Provider Note   CSN: [id number] Arrival date & time: 08/27/16  2131  By signing my name below, I, Diona BrownerJennifer Gorman, attest that this documentation has been prepared under the direction and in the presence of Marily MemosJason Mael Delap, MD. Electronically Signed: Diona BrownerJennifer Gorman, ED Scribe. 08/27/16. 10:43 PM.   History   Chief Complaint Chief Complaint  Patient presents with  . Chest Pain    HPI Matthew Alvarez is a 26 y.o. male with a PMHx of GERD and HTN who presents to the Emergency Department complaining of constant pressure to his central right chest for the last 2 days. 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 He thought it was acid or gas from drinking soda. Associated sx include mild SOB. Symptoms are similar to previous onset, however, pt rates previous pain as sharp. He states walking exacerbates pain, making his chest feel "tighter and tighter". Pt took omeprazole, tylenol, and gas pills with no relief. H/o HTN, but does not take any medication. No family history. Doesn't smoke. Hasn't seen PCP in a few years. Pt denies rash, nausea, vomiting, LOC, and any recent traumas.   The history is provided by the patient. No language interpreter was used.    Past Medical History:  Diagnosis Date  . GERD (gastroesophageal reflux disease)   . Hypertension     Patient Active Problem List   Diagnosis Date Noted  . CLOSED FRACTURE OF NECK OF RADIUS 07/09/2010    Past Surgical History:  Procedure Laterality Date  . TONSILLECTOMY AND ADENOIDECTOMY         Home Medications    Prior to Admission medications   Medication Sig Start Date End Date Taking? Authorizing Provider  omeprazole (PRILOSEC) 20 MG capsule Take 1 capsule (20 mg total) by mouth 2 (two) times daily before a meal. 08/21/14  Yes Azalia BilisKevin Campos, MD  Simethicone (GAS-X EXTRA STRENGTH PO) Take 1 tablet by mouth daily as needed (for pain-gas-relief).   Yes  Historical Provider, MD  ibuprofen (ADVIL,MOTRIN) 400 MG tablet Take 1 tablet (400 mg total) by mouth 4 (four) times daily. 08/27/16 09/03/16  Marily MemosJason Amika Tassin, MD    Family History History reviewed. No pertinent family history.  Social History Social History  Substance Use Topics  . Smoking status: Never Smoker  . Smokeless tobacco: Never Used  . Alcohol use Yes     Comment: occ. use     Allergies   Penicillins   Review of Systems Review of Systems  Cardiovascular: Positive for chest pain.  Gastrointestinal: Negative for nausea and vomiting.  Skin: Negative for rash.  Neurological: Negative for syncope.  All other systems reviewed and are negative.    Physical Exam Updated Vital Signs BP 137/80   Pulse (!) 57   Temp 98.2 F (36.8 C) (Oral)   Resp 20   Ht 5\' 9"  (1.753 m)   Wt 225 lb (102.1 kg)   SpO2 99%   BMI 33.23 kg/m   Physical Exam  Constitutional: He appears well-developed and well-nourished. No distress.  HENT:  Head: Normocephalic and atraumatic.  Eyes: Conjunctivae are normal.  Neck: Normal range of motion.  Cardiovascular: Normal rate.  Exam reveals no gallop and no friction rub.   No murmur heard. Sinus bradycardia  Pulmonary/Chest: Effort normal.  Right side pain. Lungs sound normal.     Abdominal: He exhibits no distension. There is tenderness (epigastric).  Musculoskeletal: Normal range of motion.  Neurological: He is alert.  Skin: No rash noted. No pallor.  Psychiatric: He has a normal mood and affect. His behavior is normal.  Nursing note and vitals reviewed.    ED Treatments / Results  DIAGNOSTIC STUDIES: Oxygen Saturation is 97% on RA, normal by my interpretation.   COORDINATION OF CARE: 10:02 PM-Discussed next steps with pt. Pt verbalized understanding and is agreeable with the plan.    Labs (all labs ordered are listed, but only abnormal results are displayed) Labs Reviewed  COMPREHENSIVE METABOLIC PANEL - Abnormal;  Notable for the following:       Result Value   Calcium 8.8 (*)    All other components within normal limits  LIPASE, BLOOD  CBC WITH DIFFERENTIAL/PLATELET  I-STAT TROPOININ, ED  I-STAT TROPOININ, ED    EKG  EKG Interpretation  Date/Time:  Wednesday August 27 2016 21:42:46 EDT Ventricular Rate:  50 PR Interval:    QRS Duration: 95 QT Interval:  410 QTC Calculation: 374 R Axis:   40 Text Interpretation:  Sinus rhythm RSR' in V1 or V2, probably normal variant No significant change since last tracing Confirmed by [facility] MD, Barbara Cower [phone]) on 08/27/2016 9:56:05 PM       Radiology Dg Chest 2 View  Result Date: 08/27/2016 CLINICAL DATA:  26 year old male with chest pain. EXAM: CHEST  2 VIEW COMPARISON:  Chest radiograph dated 10/13/2015 FINDINGS: The heart size and mediastinal contours are within normal limits. Both lungs are clear. The visualized skeletal structures are unremarkable. IMPRESSION: No active cardiopulmonary disease. Electronically Signed   By: Elgie Collard M.D.   On: 08/27/2016 22:13    Procedures Procedures (including critical care time)  Medications Ordered in ED Medications  ketorolac (TORADOL) 30 MG/ML injection 15 mg (15 mg Intravenous Given 08/27/16 2212)  HYDROcodone-acetaminophen (NORCO/VICODIN) 5-325 MG per tablet 1 tablet (1 tablet Oral Given 08/27/16 2212)     Initial Impression / Assessment and Plan / ED Course  I have reviewed the triage vital signs and the nursing notes.  Pertinent labs & imaging results that were available during my care of the patient were reviewed by me and considered in my medical decision making (see chart for details).     Atypical chest pain for ACS. Low Wells/negative PERC doubt PE. Didn't improve with antacids, doubt GERD. Suspect MSK. Ecg/trop ok. Improved with vicodin/toradol. Stable for dc with pcp follow up.   Final Clinical Impressions(s) / ED Diagnoses   Final diagnoses:  Nonspecific chest pain    New  Prescriptions Discharge Medication List as of 08/27/2016 10:46 PM    START taking these medications   Details  ibuprofen (ADVIL,MOTRIN) 400 MG tablet Take 1 tablet (400 mg total) by mouth 4 (four) times daily., Starting Wed 08/27/2016, Until Wed 09/03/2016, Print       I personally performed the services described in this documentation, which was scribed in my presence. The recorded information has been reviewed and is accurate.     Marily Memos, MD 08/27/16 [phone]

## 2016-08-27 NOTE — ED Triage Notes (Signed)
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

## 2016-08-27 NOTE — ED Notes (Signed)
Pt returned from xray

## 2016-10-27 ENCOUNTER — Emergency Department (HOSPITAL_COMMUNITY)
Admission: EM | Admit: 2016-10-27 | Discharge: 2016-10-27 | Disposition: A | Discharge status: Home / Self Care | Payer: BLUE CROSS/BLUE SHIELD | Attending: Emergency Medicine | Admitting: Emergency Medicine

## 2016-10-27 ENCOUNTER — Encounter (HOSPITAL_COMMUNITY): Payer: Self-pay | Admitting: *Deleted

## 2016-10-27 DIAGNOSIS — G43009 Migraine without aura, not intractable, without status migrainosus: Secondary | ICD-10-CM

## 2016-10-27 DIAGNOSIS — R51 Headache: Secondary | ICD-10-CM | POA: Diagnosis present

## 2016-10-27 DIAGNOSIS — Z79899 Other long term (current) drug therapy: Secondary | ICD-10-CM | POA: Diagnosis not present

## 2016-10-27 DIAGNOSIS — I10 Essential (primary) hypertension: Secondary | ICD-10-CM | POA: Insufficient documentation

## 2016-10-27 MED ORDER — DIPHENHYDRAMINE HCL 50 MG/ML IJ SOLN
25.0000 mg | Freq: Once | INTRAMUSCULAR | Status: AC
  Administered 2016-10-27: 25 mg via INTRAVENOUS
  Filled 2016-10-27: qty 1

## 2016-10-27 MED ORDER — PROCHLORPERAZINE EDISYLATE 5 MG/ML IJ SOLN
10.0000 mg | Freq: Once | INTRAMUSCULAR | Status: AC
  Administered 2016-10-27: 10 mg via INTRAVENOUS
  Filled 2016-10-27: qty 2

## 2016-10-27 MED ORDER — KETOROLAC TROMETHAMINE 30 MG/ML IJ SOLN
30.0000 mg | Freq: Once | INTRAMUSCULAR | Status: AC
  Administered 2016-10-27: 30 mg via INTRAVENOUS
  Filled 2016-10-27: qty 1

## 2016-10-27 NOTE — ED Provider Notes (Signed)
[facility] Provider Note   CSN: [id number] Arrival date & time: 10/27/16  0232     History   Chief Complaint Chief Complaint  Patient presents with  . Migraine    HPI Matthew Alvarez is a 26 y.o. male.  HPI  This is a 26 year old male with a history of hypertension and headaches who presents with a headache. Patient reports ongoing headache for the last 4-5 days. He states that it is frontal. It comes and goes. He has taken multiple over-the-counter pain medications including BC powder, ibuprofen and Excedrin Migraine with minimal relief. He does report a history of headaches but states that this feels worse. Current headache is 7 out of 10. Denies any fevers or neck pain. Denies worst headache of his life. Does endorse some photophobia. No nausea or vomiting.  Past Medical History:  Diagnosis Date  . GERD (gastroesophageal reflux disease)   . Hypertension     Patient Active Problem List   Diagnosis Date Noted  . CLOSED FRACTURE OF NECK OF RADIUS 07/09/2010    Past Surgical History:  Procedure Laterality Date  . TONSILLECTOMY AND ADENOIDECTOMY         Home Medications    Prior to Admission medications   Medication Sig Start Date End Date Taking? Authorizing Provider  omeprazole (PRILOSEC) 20 MG capsule Take 1 capsule (20 mg total) by mouth 2 (two) times daily before a meal. 08/21/14  Yes Azalia Bilis, MD  Simethicone (GAS-X EXTRA STRENGTH PO) Take 1 tablet by mouth daily as needed (for pain-gas-relief).   Yes [provider]    Family History No family history on file.  Social History Social History  Substance Use Topics  . Smoking status: Never Smoker  . Smokeless tobacco: Never Used  . Alcohol use Yes     Comment: occ. use     Allergies   Penicillins   Review of Systems Review of Systems  Constitutional: Negative for fever.  Eyes: Positive for photophobia. Negative for visual disturbance.  Respiratory: Negative for shortness of  breath.   Cardiovascular: Negative for chest pain.  Musculoskeletal: Negative for neck pain and neck stiffness.  Neurological: Positive for headaches. Negative for dizziness and weakness.  All other systems reviewed and are negative.    Physical Exam Updated Vital Signs BP 124/70   Pulse (!) 50   Temp 97.3 F (36.3 C)   Resp 18   Ht 5\' 9"  (1.753 m)   Wt 225 lb (102.1 kg)   SpO2 99%   BMI 33.23 kg/m   Physical Exam  Constitutional: He is oriented to person, place, and time. He appears well-developed and well-nourished. No distress.  HENT:  Head: Normocephalic and atraumatic.  Eyes: EOM are normal. Pupils are equal, round, and reactive to light.  Neck: Normal range of motion. Neck supple.  Cardiovascular: Normal rate, regular rhythm and normal heart sounds.   No murmur heard. Pulmonary/Chest: Effort normal and breath sounds normal. No respiratory distress. He has no wheezes.  Abdominal: Soft. Bowel sounds are normal. There is no tenderness. There is no rebound.  Musculoskeletal: He exhibits no edema.  Neurological: He is alert and oriented to person, place, and time.  Cranial nerves II through XII intact, 5 out of 5 strength in all 4 extremities, no dysmetria to finger-nose-finger  Skin: Skin is warm and dry.  Psychiatric: He has a normal mood and affect.  Nursing note and vitals reviewed.    ED Treatments / Results  Labs (all labs  ordered are listed, but only abnormal results are displayed) Labs Reviewed - No data to display  EKG  EKG Interpretation None       Radiology No results found.  Procedures Procedures (including critical care time)  Medications Ordered in ED Medications  prochlorperazine (COMPAZINE) injection 10 mg (10 mg Intravenous Given 10/27/16 0328)  diphenhydrAMINE (BENADRYL) injection 25 mg (25 mg Intravenous Given 10/27/16 0328)  ketorolac (TORADOL) 30 MG/ML injection 30 mg (30 mg Intravenous Given 10/27/16 0328)     Initial Impression /  Assessment and Plan / ED Course  I have reviewed the triage vital signs and the nursing notes.  Pertinent labs & imaging results that were available during my care of the patient were reviewed by me and considered in my medical decision making (see chart for details).     Patient presents with headache. History of the same. He is nontoxic. Vital signs reassuring. Neurologically intact. Patient given migraine cocktail. On repeat examination patient is resting comfortably. Pain improved. Suspect his normal migraine. Doubt infectious etiology or subarachnoid hemorrhage. He remains neurologically intact.  After history, exam, and medical workup I feel the patient has been appropriately medically screened and is safe for discharge home. Pertinent diagnoses were discussed with the patient. Patient was given return precautions.   Final Clinical Impressions(s) / ED Diagnoses   Final diagnoses:  Migraine without aura and without status migrainosus, not intractable    New Prescriptions New Prescriptions   No medications on file     Shon BatonHorton, [place] F, MD 10/27/16 [phone]

## 2016-10-27 NOTE — ED Triage Notes (Signed)
Headache for the past week. OTC not working. Denies any N/V

## 2016-12-03 ENCOUNTER — Emergency Department (HOSPITAL_COMMUNITY)
Admission: EM | Admit: 2016-12-03 | Discharge: 2016-12-03 | Disposition: A | Discharge status: Home / Self Care | Payer: BLUE CROSS/BLUE SHIELD | Attending: Emergency Medicine | Admitting: Emergency Medicine

## 2016-12-03 ENCOUNTER — Encounter (HOSPITAL_COMMUNITY): Payer: Self-pay | Admitting: Cardiology

## 2016-12-03 DIAGNOSIS — I10 Essential (primary) hypertension: Secondary | ICD-10-CM | POA: Insufficient documentation

## 2016-12-03 DIAGNOSIS — L089 Local infection of the skin and subcutaneous tissue, unspecified: Secondary | ICD-10-CM | POA: Diagnosis present

## 2016-12-03 DIAGNOSIS — N4821 Abscess of corpus cavernosum and penis: Secondary | ICD-10-CM

## 2016-12-03 DIAGNOSIS — Z79899 Other long term (current) drug therapy: Secondary | ICD-10-CM | POA: Diagnosis not present

## 2016-12-03 DIAGNOSIS — L02828 Furuncle of other sites: Secondary | ICD-10-CM | POA: Diagnosis not present

## 2016-12-03 MED ORDER — BACITRACIN ZINC 500 UNIT/GM EX OINT: 1.0000 "application " | TOPICAL_OINTMENT | Freq: Two times a day (BID) | CUTANEOUS | 0 refills | Status: DC

## 2016-12-03 NOTE — ED Provider Notes (Signed)
[facility] Provider Note   CSN: [id number] Arrival date & time: 12/03/16  [id number]     History   Chief Complaint Chief Complaint  Patient presents with  . Recurrent Skin Infections    HPI Matthew Alvarez is a 26 y.o. male.  HPI  26 year old male presents with a "bump" to the right side of the shaft of his penis. He states he noticed it yesterday after getting out of the shower. It is not particularly painful and has not been worsening since onset. He states he waited until this morning to be seen and it has not been worsening. He has no dysuria or discharge. He states he pinched it and a small amount of fluid came out. However it seemed to close back up. Overall it is not significantly painful. He is not concerned about STI. He thinks he has had this once before.  Past Medical History:  Diagnosis Date  . GERD (gastroesophageal reflux disease)   . Hypertension     Patient Active Problem List   Diagnosis Date Noted  . CLOSED FRACTURE OF NECK OF RADIUS 07/09/2010    Past Surgical History:  Procedure Laterality Date  . TONSILLECTOMY AND ADENOIDECTOMY         Home Medications    Prior to Admission medications   Medication Sig Start Date End Date Taking? Authorizing Provider  omeprazole (PRILOSEC) 20 MG capsule Take 1 capsule (20 mg total) by mouth 2 (two) times daily before a meal. 08/21/14   Azalia Bilisampos, Kevin, MD  Simethicone (GAS-X EXTRA STRENGTH PO) Take 1 tablet by mouth daily as needed (for pain-gas-relief).    [provider]    Family History History reviewed. No pertinent family history.  Social History Social History  Substance Use Topics  . Smoking status: Never Smoker  . Smokeless tobacco: Never Used  . Alcohol use Yes     Comment: occ. use     Allergies   Penicillins   Review of Systems Review of Systems  Constitutional: Negative for fever.  Genitourinary: Negative for discharge, dysuria and penile pain.  Skin: Positive for color  change. Negative for wound.  All other systems reviewed and are negative.    Physical Exam Updated Vital Signs BP 125/73   Pulse 63   Temp 97.6 F (36.4 C) (Oral)   Resp 16   Ht 5\' 9"  (1.753 m)   Wt 102.1 kg (225 lb)   SpO2 96%   BMI 33.23 kg/m   Physical Exam  Constitutional: He is oriented to person, place, and time. He appears well-developed and well-nourished. No distress.  HENT:  Head: Normocephalic and atraumatic.  Eyes: Right eye exhibits no discharge. Left eye exhibits no discharge.  Pulmonary/Chest: Effort normal.  Abdominal: He exhibits no distension.  Genitourinary: Circumcised. Penile erythema present. No penile tenderness.     Musculoskeletal: He exhibits no edema.  Neurological: He is alert and oriented to person, place, and time.  Skin: Skin is warm and dry. He is not diaphoretic.  Nursing note and vitals reviewed.    ED Treatments / Results  Labs (all labs ordered are listed, but only abnormal results are displayed) Labs Reviewed - No data to display  EKG  EKG Interpretation None       Radiology No results found.  Procedures Procedures (including critical care time)  Medications Ordered in ED Medications - No data to display   Initial Impression / Assessment and Plan / ED Course  I have reviewed the triage vital  signs and the nursing notes.  Pertinent labs & imaging results that were available during my care of the patient were reviewed by me and considered in my medical decision making (see chart for details).     Patient's presentation is consistent with a likely very early abscess versus ingrown hair. We discussed using warm compresses as well as topical antibiotics. He has no systemic symptoms. He is overall well-appearing. Follow-up with PCP for wound care check if not improving in the next couple days. If worsening and growing he may need incision and drainage but this point it is much too small to consider that at this  point.  Final Clinical Impressions(s) / ED Diagnoses   Final diagnoses:  Furuncle of penis    New Prescriptions New Prescriptions   BACITRACIN OINTMENT    Apply 1 application topically 2 (two) times daily.     Pricilla Loveless, MD 12/03/16 [phone]

## 2016-12-03 NOTE — ED Triage Notes (Signed)
Painful knot to right groin

## 2016-12-03 NOTE — ED Notes (Signed)
Pt made aware to return if symptoms worsen or if any life threatening symptoms occur.   

## 2017-05-28 ENCOUNTER — Emergency Department (HOSPITAL_COMMUNITY)
Admission: EM | Admit: 2017-05-28 | Discharge: 2017-05-28 | Disposition: A | Discharge status: Home / Self Care | Payer: Self-pay | Attending: Emergency Medicine | Admitting: Emergency Medicine

## 2017-05-28 ENCOUNTER — Encounter (HOSPITAL_COMMUNITY): Payer: Self-pay

## 2017-05-28 ENCOUNTER — Emergency Department (HOSPITAL_COMMUNITY): Payer: Self-pay

## 2017-05-28 ENCOUNTER — Other Ambulatory Visit: Payer: Self-pay

## 2017-05-28 DIAGNOSIS — R0981 Nasal congestion: Secondary | ICD-10-CM | POA: Insufficient documentation

## 2017-05-28 DIAGNOSIS — I10 Essential (primary) hypertension: Secondary | ICD-10-CM | POA: Insufficient documentation

## 2017-05-28 DIAGNOSIS — Z79899 Other long term (current) drug therapy: Secondary | ICD-10-CM | POA: Insufficient documentation

## 2017-05-28 DIAGNOSIS — J9801 Acute bronchospasm: Secondary | ICD-10-CM | POA: Insufficient documentation

## 2017-05-28 MED ORDER — PREDNISONE 10 MG PO TABS
60.0000 mg | ORAL_TABLET | Freq: Once | ORAL | Status: AC
  Administered 2017-05-28: 60 mg via ORAL
  Filled 2017-05-28: qty 1

## 2017-05-28 MED ORDER — PREDNISONE 20 MG PO TABS: ORAL_TABLET | ORAL | 0 refills | Status: DC

## 2017-05-28 MED ORDER — ALBUTEROL SULFATE HFA 108 (90 BASE) MCG/ACT IN AERS
1.0000 | INHALATION_SPRAY | RESPIRATORY_TRACT | Status: DC | PRN
  Administered 2017-05-28: 2 via RESPIRATORY_TRACT
  Filled 2017-05-28: qty 6.7

## 2017-05-28 MED ORDER — LORATADINE 10 MG PO TABS
10.0000 mg | ORAL_TABLET | Freq: Every day | ORAL | 0 refills | Status: DC
Start: 2017-05-28 — End: 2018-01-24

## 2017-05-28 NOTE — ED Triage Notes (Signed)
Pt reports nonproductive cough x 3 weeks and nasal congestion.
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
## 2017-09-26 ENCOUNTER — Other Ambulatory Visit: Payer: Self-pay

## 2017-09-26 ENCOUNTER — Emergency Department (HOSPITAL_COMMUNITY)
Admission: EM | Admit: 2017-09-26 | Discharge: 2017-09-26 | Disposition: A | Discharge status: Home / Self Care | Payer: Self-pay | Attending: Emergency Medicine | Admitting: Emergency Medicine

## 2017-09-26 DIAGNOSIS — T310 Burns involving less than 10% of body surface: Secondary | ICD-10-CM | POA: Insufficient documentation

## 2017-09-26 DIAGNOSIS — Y9389 Activity, other specified: Secondary | ICD-10-CM | POA: Insufficient documentation

## 2017-09-26 DIAGNOSIS — X111XXA Contact with running hot water, initial encounter: Secondary | ICD-10-CM | POA: Insufficient documentation

## 2017-09-26 DIAGNOSIS — Y990 Civilian activity done for income or pay: Secondary | ICD-10-CM | POA: Insufficient documentation

## 2017-09-26 DIAGNOSIS — T22232A Burn of second degree of left upper arm, initial encounter: Secondary | ICD-10-CM | POA: Insufficient documentation

## 2017-09-26 DIAGNOSIS — Z79899 Other long term (current) drug therapy: Secondary | ICD-10-CM | POA: Insufficient documentation

## 2017-09-26 DIAGNOSIS — I10 Essential (primary) hypertension: Secondary | ICD-10-CM | POA: Insufficient documentation

## 2017-09-26 DIAGNOSIS — Y929 Unspecified place or not applicable: Secondary | ICD-10-CM | POA: Insufficient documentation

## 2017-09-26 MED ORDER — SILVER SULFADIAZINE 1 % EX CREA
TOPICAL_CREAM | Freq: Once | CUTANEOUS | Status: AC
  Administered 2017-09-26: 20:00:00 via TOPICAL
  Filled 2017-09-26: qty 50

## 2017-09-26 MED ORDER — TETANUS-DIPHTH-ACELL PERTUSSIS 5-2.5-18.5 LF-MCG/0.5 IM SUSP
0.5000 mL | Freq: Once | INTRAMUSCULAR | Status: DC
  Filled 2017-09-26: qty 0.5
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
## 2017-09-26 NOTE — Discharge Instructions (Addendum)
Change the dressing every 12 hours. Follow up with Dr. Sherwood GamblerFusco in 2 days for recheck of burn.

## 2017-10-02 ENCOUNTER — Encounter (HOSPITAL_COMMUNITY): Payer: Self-pay

## 2017-10-02 ENCOUNTER — Emergency Department (HOSPITAL_COMMUNITY)
Admission: EM | Admit: 2017-10-02 | Discharge: 2017-10-02 | Disposition: A | Discharge status: Home / Self Care | Payer: Self-pay | Attending: Emergency Medicine | Admitting: Emergency Medicine

## 2017-10-02 DIAGNOSIS — E876 Hypokalemia: Secondary | ICD-10-CM | POA: Insufficient documentation

## 2017-10-02 DIAGNOSIS — I10 Essential (primary) hypertension: Secondary | ICD-10-CM | POA: Insufficient documentation

## 2017-10-02 DIAGNOSIS — R0602 Shortness of breath: Secondary | ICD-10-CM | POA: Insufficient documentation

## 2017-10-02 DIAGNOSIS — K219 Gastro-esophageal reflux disease without esophagitis: Secondary | ICD-10-CM | POA: Insufficient documentation

## 2017-10-02 DIAGNOSIS — Z79899 Other long term (current) drug therapy: Secondary | ICD-10-CM | POA: Insufficient documentation

## 2017-10-02 DIAGNOSIS — R0789 Other chest pain: Secondary | ICD-10-CM

## 2017-10-02 LAB — CBC WITH DIFFERENTIAL/PLATELET
BASOS PCT: 0 %
Basophils Absolute: 0 10*3/uL (ref 0.0–0.1)
EOS ABS: 0.2 10*3/uL (ref 0.0–0.7)
Eosinophils Relative: 2 %
HCT: 40.7 % (ref 39.0–52.0)
HEMOGLOBIN: 13.4 g/dL (ref 13.0–17.0)
Lymphocytes Relative: 51 %
Lymphs Abs: 4.7 10*3/uL — ABNORMAL HIGH (ref 0.7–4.0)
MCH: 27.8 pg (ref 26.0–34.0)
MCHC: 32.9 g/dL (ref 30.0–36.0)
MCV: 84.4 fL (ref 78.0–100.0)
Monocytes Absolute: 0.4 10*3/uL (ref 0.1–1.0)
Monocytes Relative: 5 %
NEUTROS PCT: 42 %
Neutro Abs: 3.8 10*3/uL (ref 1.7–7.7)
PLATELETS: 308 10*3/uL (ref 150–400)
RBC: 4.82 MIL/uL (ref 4.22–5.81)
RDW: 12.9 % (ref 11.5–15.5)
WBC: 9.2 10*3/uL (ref 4.0–10.5)

## 2017-10-02 LAB — COMPREHENSIVE METABOLIC PANEL
ALK PHOS: 105 U/L (ref 38–126)
ALT: 22 U/L (ref 17–63)
ANION GAP: 9 (ref 5–15)
AST: 28 U/L (ref 15–41)
Albumin: 3.8 g/dL (ref 3.5–5.0)
BILIRUBIN TOTAL: 0.4 mg/dL (ref 0.3–1.2)
BUN: 8 mg/dL (ref 6–20)
CALCIUM: 8.9 mg/dL (ref 8.9–10.3)
CO2: 26 mmol/L (ref 22–32)
Chloride: 100 mmol/L — ABNORMAL LOW (ref 101–111)
Creatinine, Ser: 1.01 mg/dL (ref 0.61–1.24)
GFR calc Af Amer: >60 mL/min (ref >60)
GFR calc non Af Amer: >60 mL/min (ref >60)
Glucose, Bld: 99 mg/dL (ref 65–99)
POTASSIUM: 3.1 mmol/L — AB (ref 3.5–5.1)
SODIUM: 135 mmol/L (ref 135–145)
TOTAL PROTEIN: 7.6 g/dL (ref 6.5–8.1)

## 2017-10-02 LAB — LIPASE, BLOOD: Lipase: 27 U/L (ref 11–51)

## 2017-10-02 LAB — TROPONIN I: Troponin I: <0.03 ng/mL (ref <0.03)

## 2017-10-02 MED ORDER — POTASSIUM CHLORIDE CRYS ER 20 MEQ PO TBCR
40.0000 meq | EXTENDED_RELEASE_TABLET | Freq: Once | ORAL | Status: AC
  Administered 2017-10-02: 40 meq via ORAL
  Filled 2017-10-02: qty 2

## 2017-10-02 MED ORDER — KETOROLAC TROMETHAMINE 30 MG/ML IJ SOLN
30.0000 mg | Freq: Once | INTRAMUSCULAR | Status: AC
  Administered 2017-10-02: 30 mg via INTRAVENOUS
  Filled 2017-10-02: qty 1

## 2017-10-02 MED ORDER — POTASSIUM CHLORIDE CRYS ER 20 MEQ PO TBCR: 20.0000 meq | EXTENDED_RELEASE_TABLET | Freq: Two times a day (BID) | ORAL | 0 refills | Status: DC

## 2017-10-02 MED ORDER — OMEPRAZOLE 20 MG PO CPDR: DELAYED_RELEASE_CAPSULE | ORAL | 0 refills | Status: DC

## 2017-10-02 MED ORDER — FAMOTIDINE IN NACL 20-0.9 MG/50ML-% IV SOLN
20.0000 mg | Freq: Once | INTRAVENOUS | Status: AC
  Administered 2017-10-02: 20 mg via INTRAVENOUS
  Filled 2017-10-02: qty 50

## 2017-10-02 NOTE — Discharge Instructions (Signed)
Look at the diet information for GERD. Take the prilosec as prescribed. Take the potassium pills until gone. Let Dr Sharyon MedicusFusco's office know about your ED visit this morning. He will want to recheck your potassium level sometime after your finish the potassium pills.  You can take acetaminophen for your chest wall pain.  Recheck if you get worse again.

## 2017-10-02 NOTE — ED Triage Notes (Signed)
Mid sternal chest pain x several days, sharp in nature, denies other symptoms.

## 2017-10-02 NOTE — ED Provider Notes (Signed)
[facility] EMERGENCY DEPARTMENT Provider Note   CSN: [id number] Arrival date & time: 10/02/17  0310  Time seen 03:35 AM   History   Chief Complaint Chief Complaint  Patient presents with  . Chest Pain    HPI Matthew Alvarez is a 27 y.o. male.  HPI patient states the evening of April 18 he was at work and about 8 PM he started having left-sided chest pain that radiates down into his left upper quadrant.  He states the pain is sharp and throbbing.  He states it is constant but it waxes and wanes.  He states leaning forward makes it feel worse, nothing makes it feel better.  He describes acid reflux yesterday but not currently.  He denies nausea, vomiting, diarrhea, or cough.  He states he feels a little short of breath.  He states he had something similar a year ago when he was diagnosed with GERD and he was advised to decrease sodas.  He states he did drink a soda at work [employer].  He states he has some omeprazole that he takes only as needed.  He denies any change in his activity or injury.  PCP Elfredia Nevins, MD   Past Medical History:  Diagnosis Date  . GERD (gastroesophageal reflux disease)   . Hypertension     Patient Active Problem List   Diagnosis Date Noted  . CLOSED FRACTURE OF NECK OF RADIUS 07/09/2010    Past Surgical History:  Procedure Laterality Date  . TONSILLECTOMY AND ADENOIDECTOMY          Home Medications    Prior to Admission medications   Medication Sig Start Date End Date Taking? Authorizing Provider  bacitracin ointment Apply 1 application topically 2 (two) times daily. 12/03/16   Pricilla Loveless, MD  loratadine (CLARITIN) 10 MG tablet Take 1 tablet (10 mg total) by mouth daily. 05/28/17   Loren Racer, MD  omeprazole (PRILOSEC) 20 MG capsule Take 1 po BID x 2 weeks then once a day 10/02/17   Devoria Albe, MD  potassium chloride SA (K-DUR,KLOR-CON) 20 MEQ tablet Take 1 tablet (20 mEq total) by mouth 2 (two) times daily. 10/02/17   Devoria Albe,  MD  predniSONE (DELTASONE) 20 MG tablet 3 tabs po day one, then 2 po daily x 4 days 05/29/17   Loren Racer, MD  Simethicone (GAS-X EXTRA STRENGTH PO) Take 1 tablet by mouth daily as needed (for pain-gas-relief).    [provider]    Family History No family history on file.  Social History Social History   Tobacco Use  . Smoking status: Never Smoker  . Smokeless tobacco: Never Used  Substance Use Topics  . Alcohol use: Yes    Comment: occ. use  . Drug use: No  employed   Allergies   Penicillins   Review of Systems Review of Systems  All other systems reviewed and are negative.    Physical Exam Updated Vital Signs BP (!) 149/89 (BP Location: Left Arm)   Pulse (!) 55   Temp 98 F (36.7 C) (Oral)   Resp 18   Ht 5\' 9"  (1.753 m)   Wt 105.2 kg (232 lb)   SpO2 98%   BMI 34.26 kg/m   Physical Exam  Constitutional: He is oriented to person, place, and time. He appears well-developed and well-nourished.  Non-toxic appearance. He does not appear ill. No distress.  Patient is playing on his cell phone  HENT:  Head: Normocephalic and atraumatic.  Right Ear:  External ear normal.  Left Ear: External ear normal.  Nose: Nose normal. No mucosal edema or rhinorrhea.  Mouth/Throat: Oropharynx is clear and moist and mucous membranes are normal. No dental abscesses or uvula swelling.  Eyes: Pupils are equal, round, and reactive to light. Conjunctivae and EOM are normal.  Neck: Normal range of motion and full passive range of motion without pain. Neck supple.  Cardiovascular: Normal rate, regular rhythm and normal heart sounds. Exam reveals no gallop and no friction rub.  No murmur heard. Pulmonary/Chest: Effort normal and breath sounds normal. No respiratory distress. He has no wheezes. He has no rhonchi. He has no rales. He exhibits tenderness. He exhibits no crepitus.    Abdominal: Soft. Normal appearance and bowel sounds are normal. He exhibits no distension.  There is tenderness in the epigastric area. There is no rebound and no guarding.    Musculoskeletal: Normal range of motion. He exhibits no edema or tenderness.  Moves all extremities well.   Neurological: He is alert and oriented to person, place, and time. He has normal strength. No cranial nerve deficit.  Skin: Skin is warm, dry and intact. No rash noted. No erythema. No pallor.  Psychiatric: He has a normal mood and affect. His speech is normal and behavior is normal. His mood appears not anxious.  Nursing note and vitals reviewed.    ED Treatments / Results  Labs (all labs ordered are listed, but only abnormal results are displayed)  Results for orders placed or performed during the hospital encounter of 10/02/17  Comprehensive metabolic panel  Result Value Ref Range   Sodium 135 135 - 145 mmol/L   Potassium 3.1 (L) 3.5 - 5.1 mmol/L   Chloride 100 (L) 101 - 111 mmol/L   CO2 26 22 - 32 mmol/L   Glucose, Bld 99 65 - 99 mg/dL   BUN 8 6 - 20 mg/dL   Creatinine, Ser 2.841.01 0.61 - 1.24 mg/dL   Calcium 8.9 8.9 - 13.210.3 mg/dL   Total Protein 7.6 6.5 - 8.1 g/dL   Albumin 3.8 3.5 - 5.0 g/dL   AST 28 15 - 41 U/L   ALT 22 17 - 63 U/L   Alkaline Phosphatase 105 38 - 126 U/L   Total Bilirubin 0.4 0.3 - 1.2 mg/dL   GFR calc non Af Amer >60 >60 mL/min   GFR calc Af Amer >60 >60 mL/min   Anion gap 9 5 - 15  Lipase, blood  Result Value Ref Range   Lipase 27 11 - 51 U/L  CBC with Differential  Result Value Ref Range   WBC 9.2 4.0 - 10.5 K/uL   RBC 4.82 4.22 - 5.81 MIL/uL   Hemoglobin 13.4 13.0 - 17.0 g/dL   HCT 44.040.7 10.239.0 - 72.552.0 %   MCV 84.4 78.0 - 100.0 fL   MCH 27.8 26.0 - 34.0 pg   MCHC 32.9 30.0 - 36.0 g/dL   RDW 36.612.9 44.011.5 - 34.715.5 %   Platelets 308 150 - 400 K/uL   Neutrophils Relative % 42 %   Neutro Abs 3.8 1.7 - 7.7 K/uL   Lymphocytes Relative 51 %   Lymphs Abs 4.7 (H) 0.7 - 4.0 K/uL   Monocytes Relative 5 %   Monocytes Absolute 0.4 0.1 - 1.0 K/uL   Eosinophils Relative 2 %    Eosinophils Absolute 0.2 0.0 - 0.7 K/uL   Basophils Relative 0 %   Basophils Absolute 0.0 0.0 - 0.1 K/uL  Troponin I  Result  Value Ref Range   Troponin I <0.03 <0.03 ng/mL   Laboratory interpretation all normal except hypokalemia   EKG EKG Interpretation  Date/Time:  Friday October 02 2017 03:17:35 EDT Ventricular Rate:  58 PR Interval:    QRS Duration: 96 QT Interval:  386 QTC Calculation: 380 R Axis:   42 Text Interpretation:  Sinus rhythm RSR pattern in V2 Early repolarization pattern No significant change since last tracing 27 Aug 2016 Confirmed by Devoria Albe ([id number]) on 10/02/2017 3:21:42 AM   Radiology No results found.  Procedures Procedures (including critical care time)  Medications Ordered in ED Medications  ketorolac (TORADOL) 30 MG/ML injection 30 mg (30 mg Intravenous Given 10/02/17 0407)  famotidine (PEPCID) IVPB 20 mg premix (0 mg Intravenous Stopped 10/02/17 0438)  potassium chloride SA (K-DUR,KLOR-CON) CR tablet 40 mEq (40 mEq Oral Given 10/02/17 0622)     Initial Impression / Assessment and Plan / ED Course  I have reviewed the triage vital signs and the nursing notes.  Pertinent labs & imaging results that were available during my care of the patient were reviewed by me and considered in my medical decision making (see chart for details).     Patient appeared to have some chest wall pain, his chest wall was tender when I palpated it.  However he also has some history of reflux problems and some epigastric pain tonight.  He was given Toradol IV and was given Pepcid IV.  Recheck at 6:30 AM patient sitting on the side of the bed.  He states he is feeling much better.  We discussed his test results which included the hypokalemia, he states he has been on potassium pills in the past.  We discussed getting him back on his omeprazole.  I asked him to call his doctor's office let them know about his ED visit to check his potassium again once the potassium pills  are gone.  Final Clinical Impressions(s) / ED Diagnoses   Final diagnoses:  Chest wall pain  Gastroesophageal reflux disease without esophagitis  Hypokalemia    ED Discharge Orders        Ordered    omeprazole (PRILOSEC) 20 MG capsule     10/02/17 0649    potassium chloride SA (K-DUR,KLOR-CON) 20 MEQ tablet  2 times daily     10/02/17 0649    OTC acetaminophen  Plan discharge  Devoria Albe, MD, Concha Pyo, MD 10/02/17 [phone]

## 2017-10-14 IMAGING — DX DG SHOULDER 2+V*L*
3 series · 3 of 3 positions shown · non-contrast
Comparison: None.

CLINICAL DATA: Acute onset of left shoulder pain. Initial
encounter.

EXAM:
LEFT SHOULDER - 2+ VIEW

[shoulder grashey]
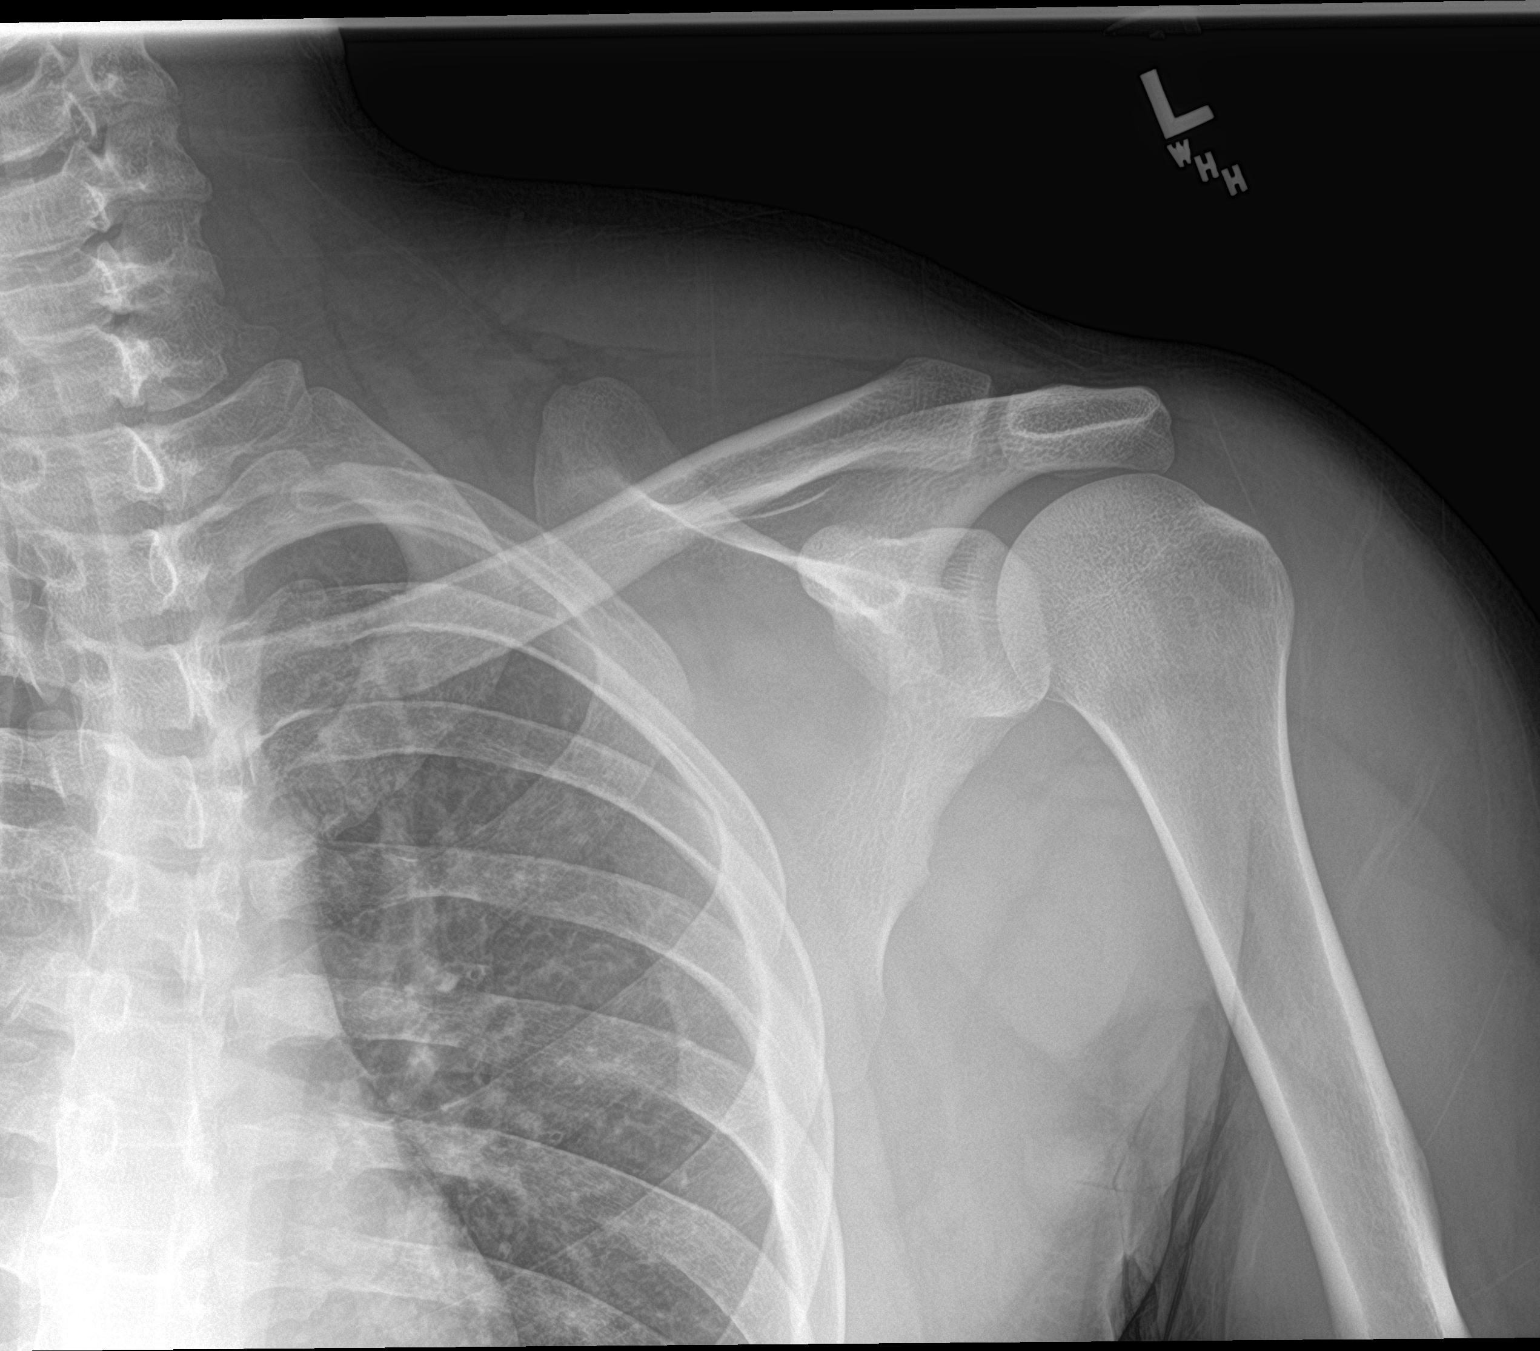

[shoulder y view (1 of 2)]
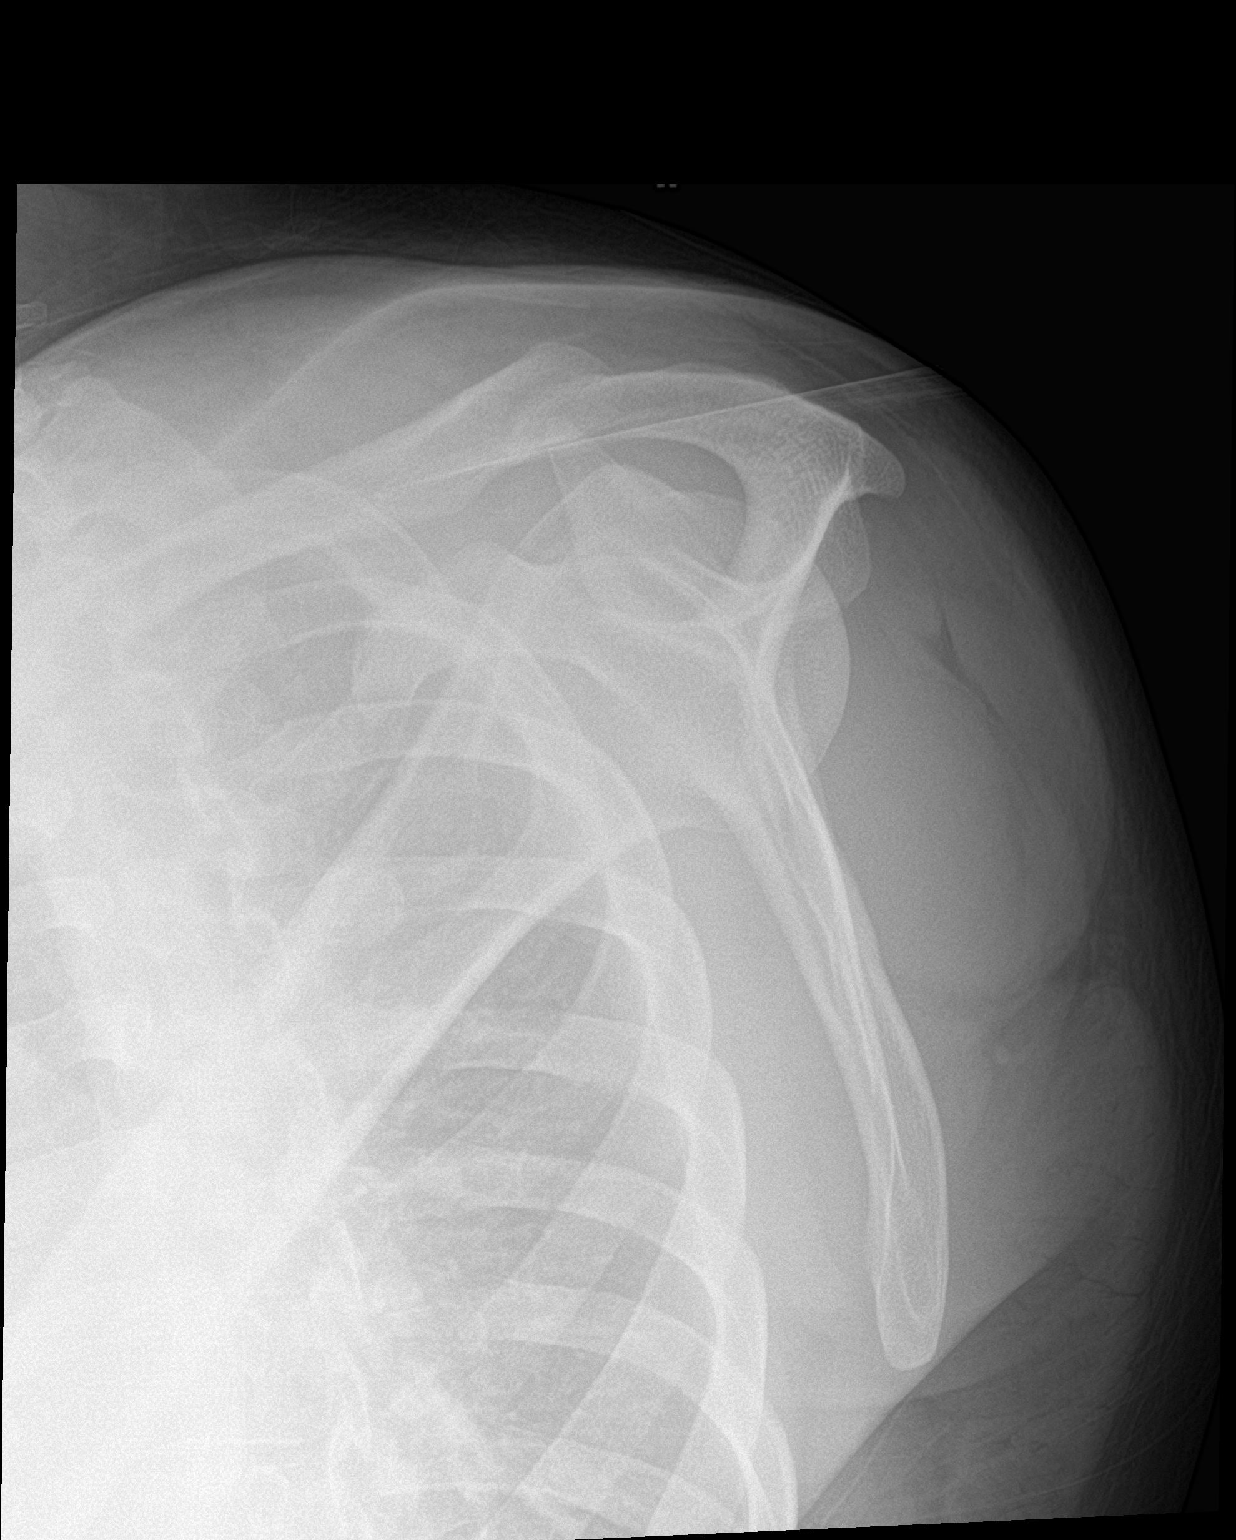

[shoulder y view (2 of 2)]
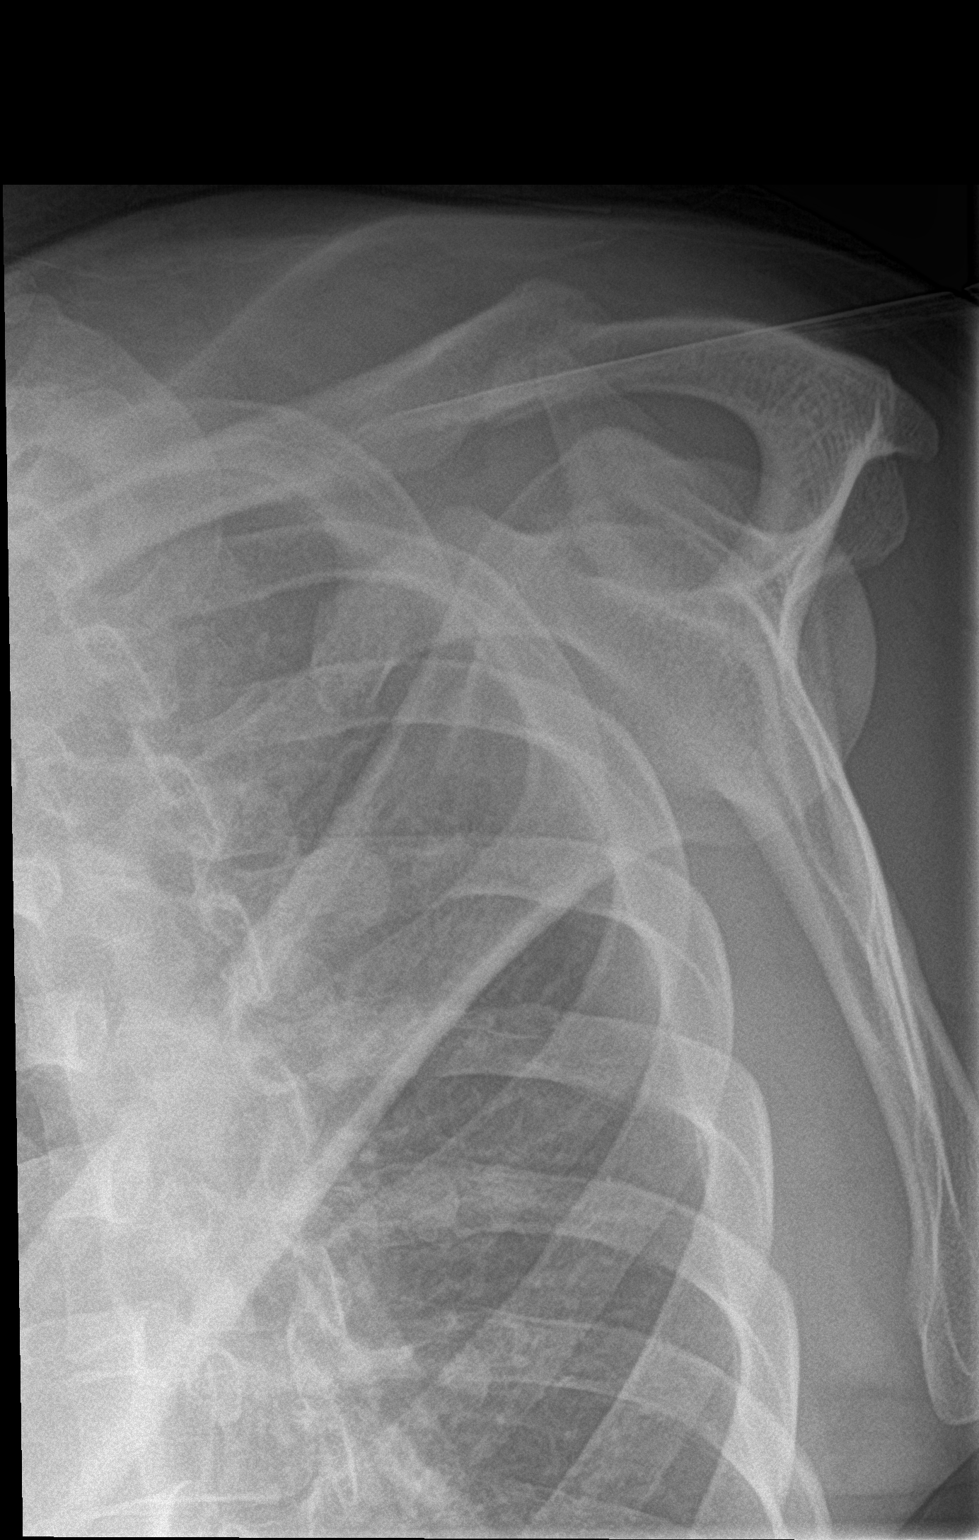

[3 of 3 positions shown; findings below may reference images not displayed]

FINDINGS: There is no evidence of fracture or dislocation. The left humeral
head is seated within the glenoid fossa. The acromioclavicular joint
is unremarkable in appearance. No significant soft tissue
abnormalities are seen. The visualized portions of the left lung are
clear.
IMPRESSION: No evidence of fracture or dislocation.

## 2018-01-24 ENCOUNTER — Encounter (HOSPITAL_COMMUNITY): Payer: Self-pay | Admitting: *Deleted

## 2018-01-24 ENCOUNTER — Emergency Department (HOSPITAL_COMMUNITY)
Admission: EM | Admit: 2018-01-24 | Discharge: 2018-01-24 | Disposition: A | Discharge status: Home / Self Care | Payer: Self-pay | Attending: Emergency Medicine | Admitting: Emergency Medicine

## 2018-01-24 DIAGNOSIS — Z79899 Other long term (current) drug therapy: Secondary | ICD-10-CM | POA: Insufficient documentation

## 2018-01-24 DIAGNOSIS — I10 Essential (primary) hypertension: Secondary | ICD-10-CM | POA: Insufficient documentation

## 2018-01-24 DIAGNOSIS — N390 Urinary tract infection, site not specified: Secondary | ICD-10-CM | POA: Insufficient documentation

## 2018-01-24 LAB — URINALYSIS, ROUTINE W REFLEX MICROSCOPIC
BACTERIA UA: NONE SEEN
BILIRUBIN URINE: NEGATIVE
GLUCOSE, UA: NEGATIVE mg/dL
Ketones, ur: NEGATIVE mg/dL
NITRITE: NEGATIVE
PH: 5 (ref 5.0–8.0)
Protein, ur: 30 mg/dL — AB
Specific Gravity, Urine: 1.019 (ref 1.005–1.030)
WBC, UA: >50 WBC/hpf — ABNORMAL HIGH (ref 0–5)

## 2018-01-24 MED ORDER — CIPROFLOXACIN HCL 250 MG PO TABS
500.0000 mg | ORAL_TABLET | Freq: Once | ORAL | Status: AC
  Administered 2018-01-24: 500 mg via ORAL
  Filled 2018-01-24: qty 2

## 2018-01-24 MED ORDER — CIPROFLOXACIN HCL 500 MG PO TABS: 500.0000 mg | ORAL_TABLET | Freq: Two times a day (BID) | ORAL | 0 refills | Status: DC

## 2018-01-24 NOTE — Discharge Instructions (Signed)
You have a urinary tract infection.  Increase fluids.  Prescription for antibiotic.  Follow-up with your primary care doctor. °
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
## 2018-01-24 NOTE — ED Triage Notes (Signed)
Pt last voided about 1400, decrease in frequency per pt,  + pain with urination.  Pt denies penile discharge. Pt denies fever or N/V

## 2018-01-26 LAB — URINE CULTURE: Culture: NO GROWTH

## 2018-03-23 ENCOUNTER — Emergency Department (HOSPITAL_COMMUNITY)
Admission: EM | Admit: 2018-03-23 | Discharge: 2018-03-23 | Disposition: A | Discharge status: Home / Self Care | Payer: Self-pay | Attending: Emergency Medicine | Admitting: Emergency Medicine

## 2018-03-23 ENCOUNTER — Encounter (HOSPITAL_COMMUNITY): Payer: Self-pay | Admitting: Emergency Medicine

## 2018-03-23 ENCOUNTER — Other Ambulatory Visit: Payer: Self-pay

## 2018-03-23 DIAGNOSIS — I10 Essential (primary) hypertension: Secondary | ICD-10-CM | POA: Insufficient documentation

## 2018-03-23 DIAGNOSIS — R5383 Other fatigue: Secondary | ICD-10-CM | POA: Insufficient documentation

## 2018-03-23 DIAGNOSIS — Z79899 Other long term (current) drug therapy: Secondary | ICD-10-CM | POA: Insufficient documentation

## 2018-03-23 LAB — URINALYSIS, ROUTINE W REFLEX MICROSCOPIC
BILIRUBIN URINE: NEGATIVE
GLUCOSE, UA: NEGATIVE mg/dL
HGB URINE DIPSTICK: NEGATIVE
Ketones, ur: NEGATIVE mg/dL
Leukocytes, UA: NEGATIVE
Nitrite: NEGATIVE
PROTEIN: NEGATIVE mg/dL
Specific Gravity, Urine: 1.031 — ABNORMAL HIGH (ref 1.005–1.030)
pH: 6 (ref 5.0–8.0)

## 2018-03-23 LAB — CBC WITH DIFFERENTIAL/PLATELET
Abs Immature Granulocytes: 0.02 10*3/uL (ref 0.00–0.07)
BASOS ABS: 0 10*3/uL (ref 0.0–0.1)
Basophils Relative: 0 %
Eosinophils Absolute: 0.2 10*3/uL (ref 0.0–0.5)
Eosinophils Relative: 2 %
HEMATOCRIT: 46.5 % (ref 39.0–52.0)
HEMOGLOBIN: 15.5 g/dL (ref 13.0–17.0)
IMMATURE GRANULOCYTES: 0 %
LYMPHS ABS: 4 10*3/uL (ref 0.7–4.0)
LYMPHS PCT: 47 %
MCH: 28.2 pg (ref 26.0–34.0)
MCHC: 33.3 g/dL (ref 30.0–36.0)
MCV: 84.7 fL (ref 80.0–100.0)
Monocytes Absolute: 0.6 10*3/uL (ref 0.1–1.0)
Monocytes Relative: 7 %
NEUTROS PCT: 44 %
NRBC: 0 % (ref 0.0–0.2)
Neutro Abs: 3.7 10*3/uL (ref 1.7–7.7)
Platelets: 300 10*3/uL (ref 150–400)
RBC: 5.49 MIL/uL (ref 4.22–5.81)
RDW: 13.1 % (ref 11.5–15.5)
WBC: 8.5 10*3/uL (ref 4.0–10.5)

## 2018-03-23 LAB — COMPREHENSIVE METABOLIC PANEL
ALT: 22 U/L (ref 0–44)
AST: 23 U/L (ref 15–41)
Albumin: 4 g/dL (ref 3.5–5.0)
Alkaline Phosphatase: 91 U/L (ref 38–126)
Anion gap: 6 (ref 5–15)
BILIRUBIN TOTAL: 0.7 mg/dL (ref 0.3–1.2)
BUN: 11 mg/dL (ref 6–20)
CHLORIDE: 104 mmol/L (ref 98–111)
CO2: 27 mmol/L (ref 22–32)
Calcium: 8.8 mg/dL — ABNORMAL LOW (ref 8.9–10.3)
Creatinine, Ser: 1.06 mg/dL (ref 0.61–1.24)
GFR calc Af Amer: >60 mL/min (ref >60)
Glucose, Bld: 72 mg/dL (ref 70–99)
POTASSIUM: 3.6 mmol/L (ref 3.5–5.1)
Sodium: 137 mmol/L (ref 135–145)
Total Protein: 7.7 g/dL (ref 6.5–8.1)

## 2018-03-23 LAB — RAPID URINE DRUG SCREEN, HOSP PERFORMED
AMPHETAMINES: NOT DETECTED
BARBITURATES: NOT DETECTED
BENZODIAZEPINES: NOT DETECTED
Cocaine: NOT DETECTED
Opiates: NOT DETECTED
Tetrahydrocannabinol: NOT DETECTED

## 2018-03-23 LAB — ACETAMINOPHEN LEVEL

## 2018-03-23 LAB — PROTIME-INR
INR: 0.95
Prothrombin Time: 12.6 seconds (ref 11.4–15.2)

## 2018-03-23 LAB — SALICYLATE LEVEL: Salicylate Lvl: <7 mg/dL (ref 2.8–30.0)

## 2018-03-23 LAB — ETHANOL: Alcohol, Ethyl (B): <10 mg/dL (ref <10)

## 2018-03-23 LAB — TROPONIN I

## 2018-03-23 NOTE — ED Provider Notes (Signed)
[facility] EMERGENCY DEPARTMENT Provider Note   CSN: [id number] Arrival date & time: 03/23/18  1744     History   Chief Complaint Chief Complaint  Patient presents with  . Fatigue    HPI Matthew Alvarez is a 27 y.o. male.  HPI  Pt was seen at 1840. Per pt, c/o gradual onset and persistence of constant generalized weakness/fatigue that began this morning. Pt states before he went to work overnight last night he "drank a detox drink to lose weight." Pt cannot tell me the name of it or the ingredients in it. Denies any specific symptoms. Denies sore throat, no fevers, no focal motor weakness, no tingling/numbness in extremities, no CP/palpitations, no SOB/cough, no back pain, no abd pain, no N/V/D, no rash.    Past Medical History:  Diagnosis Date  . GERD (gastroesophageal reflux disease)   . Hypertension     Patient Active Problem List   Diagnosis Date Noted  . CLOSED FRACTURE OF NECK OF RADIUS 07/09/2010    Past Surgical History:  Procedure Laterality Date  . TONSILLECTOMY AND ADENOIDECTOMY          Home Medications    Prior to Admission medications   Medication Sig Start Date End Date Taking? Authorizing Provider  acetaminophen (TYLENOL) 500 MG tablet Take 500 mg by mouth every 6 (six) hours as needed for moderate pain.    [provider]  ciprofloxacin (CIPRO) 500 MG tablet Take 1 tablet (500 mg total) by mouth 2 (two) times daily. 01/24/18   Donnetta Hutching, MD  omeprazole (PRILOSEC) 20 MG capsule Take 1 po BID x 2 weeks then once a day Patient taking differently: Take 20 mg by mouth daily.  10/02/17   Devoria Albe, MD  potassium chloride SA (K-DUR,KLOR-CON) 20 MEQ tablet Take 1 tablet (20 mEq total) by mouth 2 (two) times daily. 10/02/17   Devoria Albe, MD    Family History History reviewed. No pertinent family history.  Social History Social History   Tobacco Use  . Smoking status: Never Smoker  . Smokeless tobacco: Never Used  Substance Use Topics  .  Alcohol use: Yes    Comment: occ. use  . Drug use: No     Allergies   Penicillins   Review of Systems Review of Systems ROS: Statement: All systems negative except as marked or noted in the HPI; Constitutional: Negative for fever and chills. +generalized weakness/fatigue.; ; Eyes: Negative for eye pain, redness and discharge. ; ; ENMT: Negative for ear pain, hoarseness, nasal congestion, sinus pressure and sore throat. ; ; Cardiovascular: Negative for chest pain, palpitations, diaphoresis, dyspnea and peripheral edema. ; ; Respiratory: Negative for cough, wheezing and stridor. ; ; Gastrointestinal: Negative for nausea, vomiting, diarrhea, abdominal pain, blood in stool, hematemesis, jaundice and rectal bleeding. . ; ; Genitourinary: Negative for dysuria, flank pain and hematuria. ; ; Musculoskeletal: Negative for back pain and neck pain. Negative for swelling and trauma.; ; Skin: Negative for pruritus, rash, abrasions, blisters, bruising and skin lesion.; ; Neuro: Negative for headache, lightheadedness and neck stiffness. Negative for altered level of consciousness, altered mental status, extremity weakness, paresthesias, involuntary movement, seizure and syncope.      Physical Exam Updated Vital Signs BP 131/76 (BP Location: Right Arm)   Pulse 63   Temp 98.3 F (36.8 C) (Oral)   Resp 16   Ht 5\' 9"  (1.753 m)   Wt 102.5 kg   SpO2 98%   BMI 33.37 kg/m  19:57 Orthostatic Vital Signs NH  Orthostatic Lying   BP- Lying: 122/69   Pulse- Lying: 51       Orthostatic Sitting  BP- Sitting: 128/75   Pulse- Sitting: 54       Orthostatic Standing at 0 minutes  BP- Standing at 0 minutes: 136/58   Pulse- Standing at 0 minutes: 60       Orthostatic Standing at 3 minutes  BP- Standing at 3 minutes: 135/76   Pulse- Standing at 3 minutes: 52      Physical Exam 1845: Physical examination:  Nursing notes reviewed; Vital signs and O2 SAT reviewed;  Constitutional: Well developed,  Well nourished, Well hydrated, In no acute distress; Head:  Normocephalic, atraumatic; Eyes: EOMI, PERRL, No scleral icterus; ENMT: Mouth and pharynx normal, Mucous membranes moist; Neck: Supple, Full range of motion, No lymphadenopathy; Cardiovascular: Regular rate and rhythm, No gallop; Respiratory: Breath sounds clear & equal bilaterally, No wheezes.  Speaking full sentences with ease, Normal respiratory effort/excursion; Chest: Nontender, Movement normal; Abdomen: Soft, Nontender, Nondistended, Normal bowel sounds; Genitourinary: No CVA tenderness; Extremities: Peripheral pulses normal, No tenderness, No edema, No calf edema or asymmetry.; Neuro: AA&Ox3, Major CN grossly intact.  Speech clear. No gross focal motor or sensory deficits in extremities. Climbs on and off stretcher easily by himself. Gait steady..; Skin: Color normal, Warm, Dry.    ED Treatments / Results  Labs (all labs ordered are listed, but only abnormal results are displayed)   EKG EKG Interpretation  Date/Time:  Tuesday March 23 2018 19:47:03 EDT Ventricular Rate:  47 PR Interval:    QRS Duration: 105 QT Interval:  433 QTC Calculation: 383 R Axis:   45 Text Interpretation:  Sinus bradycardia RSR' in V1 or V2, probably normal variant Early repolarization pattern When compared with ECG of 10/02/2017 No significant change was found Confirmed by Samuel Jester [phone]) on 03/23/2018 8:00:48 PM   Radiology   Procedures Procedures (including critical care time)  Medications Ordered in ED Medications - No data to display   Initial Impression / Assessment and Plan / ED Course  I have reviewed the triage vital signs and the nursing notes.  Pertinent labs & imaging results that were available during my care of the patient were reviewed by me and considered in my medical decision making (see chart for details).  MDM Reviewed: previous chart, nursing note and vitals Reviewed previous: labs and ECG Interpretation:  labs and ECG   Results for orders placed or performed during the hospital encounter of 03/23/18  Urinalysis, Routine w reflex microscopic  Result Value Ref Range   Color, Urine YELLOW YELLOW   APPearance HAZY (A) CLEAR   Specific Gravity, Urine 1.031 (H) 1.005 - 1.030   pH 6.0 5.0 - 8.0   Glucose, UA NEGATIVE NEGATIVE mg/dL   Hgb urine dipstick NEGATIVE NEGATIVE   Bilirubin Urine NEGATIVE NEGATIVE   Ketones, ur NEGATIVE NEGATIVE mg/dL   Protein, ur NEGATIVE NEGATIVE mg/dL   Nitrite NEGATIVE NEGATIVE   Leukocytes, UA NEGATIVE NEGATIVE  Urine rapid drug screen (hosp performed)  Result Value Ref Range   Opiates NONE DETECTED NONE DETECTED   Cocaine NONE DETECTED NONE DETECTED   Benzodiazepines NONE DETECTED NONE DETECTED   Amphetamines NONE DETECTED NONE DETECTED   Tetrahydrocannabinol NONE DETECTED NONE DETECTED   Barbiturates NONE DETECTED NONE DETECTED  Acetaminophen level  Result Value Ref Range   Acetaminophen (Tylenol), Serum <10 (L) 10 - 30 ug/mL  Comprehensive metabolic panel  Result Value Ref  Range   Sodium 137 135 - 145 mmol/L   Potassium 3.6 3.5 - 5.1 mmol/L   Chloride 104 98 - 111 mmol/L   CO2 27 22 - 32 mmol/L   Glucose, Bld 72 70 - 99 mg/dL   BUN 11 6 - 20 mg/dL   Creatinine, Ser 1.61 0.61 - 1.24 mg/dL   Calcium 8.8 (L) 8.9 - 10.3 mg/dL   Total Protein 7.7 6.5 - 8.1 g/dL   Albumin 4.0 3.5 - 5.0 g/dL   AST 23 15 - 41 U/L   ALT 22 0 - 44 U/L   Alkaline Phosphatase 91 38 - 126 U/L   Total Bilirubin 0.7 0.3 - 1.2 mg/dL   GFR calc non Af Amer >60 >60 mL/min   GFR calc Af Amer >60 >60 mL/min   Anion gap 6 5 - 15  Ethanol  Result Value Ref Range   Alcohol, Ethyl (B) <10 <10 mg/dL  Salicylate level  Result Value Ref Range   Salicylate Lvl <7.0 2.8 - 30.0 mg/dL  CBC with Differential  Result Value Ref Range   WBC 8.5 4.0 - 10.5 K/uL   RBC 5.49 4.22 - 5.81 MIL/uL   Hemoglobin 15.5 13.0 - 17.0 g/dL   HCT 09.6 04.5 - 40.9 %   MCV 84.7 80.0 - 100.0 fL   MCH  28.2 26.0 - 34.0 pg   MCHC 33.3 30.0 - 36.0 g/dL   RDW 81.1 91.4 - 78.2 %   Platelets 300 150 - 400 K/uL   nRBC 0.0 0.0 - 0.2 %   Neutrophils Relative % 44 %   Neutro Abs 3.7 1.7 - 7.7 K/uL   Lymphocytes Relative 47 %   Lymphs Abs 4.0 0.7 - 4.0 K/uL   Monocytes Relative 7 %   Monocytes Absolute 0.6 0.1 - 1.0 K/uL   Eosinophils Relative 2 %   Eosinophils Absolute 0.2 0.0 - 0.5 K/uL   Basophils Relative 0 %   Basophils Absolute 0.0 0.0 - 0.1 K/uL   Immature Granulocytes 0 %   Abs Immature Granulocytes 0.02 0.00 - 0.07 K/uL  Protime-INR  Result Value Ref Range   Prothrombin Time 12.6 11.4 - 15.2 seconds   INR 0.95   Troponin I  Result Value Ref Range   Troponin I <0.03 <0.03 ng/mL    2110:  Workup reassuring. Pt requesting a work note for his shift overnight tonight. Dx and testing d/w pt.  Questions answered.  Verb understanding, agreeable to d/c home with outpt f/u.    Final Clinical Impressions(s) / ED Diagnoses   Final diagnoses:  None    ED Discharge Orders    None       Samuel Jester, DO 03/26/18 2111

## 2018-03-23 NOTE — Discharge Instructions (Signed)
Take your usual prescriptions as previously directed.  Call your regular medical doctor tomorrow to schedule a follow up appointment within the week.  Return to the Emergency Department immediately sooner if worsening.  ° °

## 2018-03-23 NOTE — ED Notes (Signed)
Pt ambulatory to waiting room. Pt verbalized understanding of discharge instructions.   

## 2018-03-23 NOTE — ED Triage Notes (Signed)
Pt state after working last night, pt has no energy.  Denies n/v/d, urinary symptoms or abdominal pain.  Pt eating and drinking well.  Pt states " I just want to make sure im not dehydrated"

## 2018-08-01 ENCOUNTER — Encounter (HOSPITAL_COMMUNITY): Payer: Self-pay | Admitting: Emergency Medicine

## 2018-08-01 ENCOUNTER — Emergency Department (HOSPITAL_COMMUNITY)
Admission: EM | Admit: 2018-08-01 | Discharge: 2018-08-01 | Disposition: A | Discharge status: Home / Self Care | Payer: Self-pay | Attending: Emergency Medicine | Admitting: Emergency Medicine

## 2018-08-01 ENCOUNTER — Other Ambulatory Visit: Payer: Self-pay

## 2018-08-01 DIAGNOSIS — I10 Essential (primary) hypertension: Secondary | ICD-10-CM | POA: Insufficient documentation

## 2018-08-01 DIAGNOSIS — R69 Illness, unspecified: Secondary | ICD-10-CM

## 2018-08-01 DIAGNOSIS — J111 Influenza due to unidentified influenza virus with other respiratory manifestations: Secondary | ICD-10-CM | POA: Insufficient documentation

## 2018-08-01 MED ORDER — IBUPROFEN 400 MG PO TABS
600.0000 mg | ORAL_TABLET | Freq: Once | ORAL | Status: AC
  Administered 2018-08-01: 600 mg via ORAL
  Filled 2018-08-01: qty 2

## 2018-08-01 MED ORDER — BENZONATATE 100 MG PO CAPS: 100.0000 mg | ORAL_CAPSULE | Freq: Three times a day (TID) | ORAL | 0 refills | Status: DC | PRN

## 2018-08-01 NOTE — ED Triage Notes (Signed)
Patient c/o generalized fatigue, headache, dry cough, right ear pain, and some intermittent dizziness. Patient states son and wife recently treated for flu.

## 2018-08-01 NOTE — Discharge Instructions (Addendum)
You likely have the flu this is a viral infection that will likely start to improve after 5-7 days, antibiotics are not helpful in treating viral infections.   Please make sure you are drinking plenty of fluids.  You can treat your symptoms supportively with tylenol/ibuprofen for fevers and pains, Zyrtec and Flonase to heal with nasal congestion. Prescription for Matthew Alvarez has been sent to your pharmacy.  This is a cough medicine, please take as directed.  You can also try throat lozenges to help with cough.  If your symptoms are not improving please follow up with you Primary doctor.   If you develop persistent fevers, shortness of breath or difficulty breathing, chest pain, severe headache and neck pain, persistent nausea and vomiting or other new or concerning symptoms return to the Emergency department.

## 2018-08-01 NOTE — ED Provider Notes (Signed)
[facility] EMERGENCY DEPARTMENT Provider Note   CSN: [id number] Arrival date & time: 08/01/18  4709     History   Chief Complaint Chief Complaint  Patient presents with  . Fatigue    HPI Matthew Alvarez is a 28 y.o. male otherwise healthy presenting to emergency department with chief complaint of flulike symptoms x2 days.  Patient reports fatigue, headache, dry cough, right ear pain, subjective fevers and chills. His symptoms have been constant. He describes his ear pain as pressure without raditon of pain. Patient has taken Tylenol for his symptoms with minimal relief. Also reports decreased PO intake and intermittent dizziness.  He did not receive influenza vaccine this year.  Denies nausea, vomiting, shortness of breath, chest pain, diarrhea, urinary symptoms.  Admits to sick contacts.  His wife and son were recently diagnosed with the flu.  Past Medical History:  Diagnosis Date  . GERD (gastroesophageal reflux disease)   . Hypertension     Patient Active Problem List   Diagnosis Date Noted  . CLOSED FRACTURE OF NECK OF RADIUS 07/09/2010    Past Surgical History:  Procedure Laterality Date  . TONSILLECTOMY AND ADENOIDECTOMY          Home Medications    Prior to Admission medications   Medication Sig Start Date End Date Taking? Authorizing Provider  benzonatate (TESSALON) 100 MG capsule Take 1 capsule (100 mg total) by mouth 3 (three) times daily as needed for cough. 08/01/18   Sheala Dosh E, PA-C  ibuprofen (ADVIL,MOTRIN) 200 MG tablet Take 200 mg by mouth every 6 (six) hours as needed for mild pain or moderate pain.    [provider]  omeprazole (PRILOSEC) 20 MG capsule Take 1 po BID x 2 weeks then once a day Patient not taking: Reported on 03/23/2018 10/02/17   Devoria Albe, MD    Family History No family history on file.  Social History Social History   Tobacco Use  . Smoking status: Never Smoker  . Smokeless tobacco: Never Used  Substance  Use Topics  . Alcohol use: Yes    Comment: occ. use  . Drug use: No     Allergies   Penicillins   Review of Systems Review of Systems  Constitutional: Positive for chills, fatigue and fever.  HENT: Positive for congestion and ear pain. Negative for sinus pain and sore throat.   Neurological: Positive for dizziness.  All other systems reviewed and are negative.    Physical Exam Updated Vital Signs BP 137/76 (BP Location: Right Arm)   Pulse 77   Temp 98.9 F (37.2 C) (Oral)   Resp 18   Ht 5\' 9"  (1.753 m)   Wt 102.1 kg   SpO2 97%   BMI 33.23 kg/m   Physical Exam Vitals signs and nursing note reviewed.  Constitutional:      Appearance: He is not ill-appearing or toxic-appearing.  HENT:     Head: Normocephalic and atraumatic.     Right Ear: Tympanic membrane normal.     Left Ear: Tympanic membrane normal.     Nose: Congestion present.     Mouth/Throat:     Mouth: Mucous membranes are moist.     Pharynx: Oropharynx is clear.  Eyes:     Conjunctiva/sclera: Conjunctivae normal.  Neck:     Musculoskeletal: Normal range of motion. No muscular tenderness.  Cardiovascular:     Rate and Rhythm: Normal rate and regular rhythm.     Pulses: Normal pulses.  Heart sounds: Normal heart sounds.  Pulmonary:     Effort: Pulmonary effort is normal.     Breath sounds: Normal breath sounds.  Abdominal:     General: There is no distension.     Palpations: Abdomen is soft.     Tenderness: There is no abdominal tenderness. There is no guarding or rebound.  Musculoskeletal: Normal range of motion.  Skin:    General: Skin is warm and dry.     Capillary Refill: Capillary refill takes less than 2 seconds.     Findings: No rash.  Neurological:     Mental Status: He is alert. Mental status is at baseline.     Motor: No weakness.  Psychiatric:        Behavior: Behavior normal.      ED Treatments / Results  Labs (all labs ordered are listed, but only abnormal results are  displayed) Labs Reviewed - No data to display  EKG None  Radiology No results found.  Procedures Procedures (including critical care time)  Medications Ordered in ED Medications  ibuprofen (ADVIL,MOTRIN) tablet 600 mg (600 mg Oral Given 08/01/18 1009)     Initial Impression / Assessment and Plan / ED Course  I have reviewed the triage vital signs and the nursing notes.  Pertinent labs & imaging results that were available during my care of the patient were reviewed by me and considered in my medical decision making (see chart for details).    Patient with symptoms consistent with influenza.  Vitals are stable.  No signs of dehydration, tolerating PO's.  Lungs are clear. Due to patient's presentation and physical exam a chest x-ray was not ordered bc likely diagnosis of flu.  Discussed the risk versus benefit of Tamiflu treatment with the patient.  The patient understands that he is in the 24 to 48-hour window of treatment, however he declines Tamiflu.  Patient will be discharged with instructions to orally hydrate, rest, and use over-the-counter medications such as anti-inflammatories ibuprofen and Aleve for muscle aches and Tylenol for fever.  Patient will also be given a cough suppressant.  Discussed strict ED return precautions. Pt verbalized understanding of and is in agreement with this plan. Pt stable for discharge home at this time.   Final Clinical Impressions(s) / ED Diagnoses   Final diagnoses:  Influenza-like illness    ED Discharge Orders         Ordered    benzonatate (TESSALON) 100 MG capsule  3 times daily PRN     08/01/18 0951           Sherene Sires, PA-C 08/01/18 2031    Maia Plan, MD 08/03/18 [phone]

## 2018-09-15 ENCOUNTER — Encounter (HOSPITAL_COMMUNITY): Payer: Self-pay | Admitting: Emergency Medicine

## 2018-09-15 ENCOUNTER — Emergency Department (HOSPITAL_COMMUNITY)
Admission: EM | Admit: 2018-09-15 | Discharge: 2018-09-15 | Disposition: A | Discharge status: Home / Self Care | Payer: Self-pay | Attending: Emergency Medicine | Admitting: Emergency Medicine

## 2018-09-15 ENCOUNTER — Other Ambulatory Visit: Payer: Self-pay

## 2018-09-15 DIAGNOSIS — Z113 Encounter for screening for infections with a predominantly sexual mode of transmission: Secondary | ICD-10-CM | POA: Insufficient documentation

## 2018-09-15 DIAGNOSIS — I10 Essential (primary) hypertension: Secondary | ICD-10-CM | POA: Insufficient documentation

## 2018-09-15 DIAGNOSIS — R300 Dysuria: Secondary | ICD-10-CM | POA: Insufficient documentation

## 2018-09-15 DIAGNOSIS — Z202 Contact with and (suspected) exposure to infections with a predominantly sexual mode of transmission: Secondary | ICD-10-CM

## 2018-09-15 LAB — URINALYSIS, ROUTINE W REFLEX MICROSCOPIC
Bilirubin Urine: NEGATIVE
Glucose, UA: NEGATIVE mg/dL
Hgb urine dipstick: NEGATIVE
Ketones, ur: NEGATIVE mg/dL
Leukocytes,Ua: NEGATIVE
Nitrite: NEGATIVE
Protein, ur: NEGATIVE mg/dL
Specific Gravity, Urine: 1.013 (ref 1.005–1.030)
pH: 7 (ref 5.0–8.0)

## 2018-09-15 MED ORDER — AZITHROMYCIN 250 MG PO TABS: 2000.0000 mg | ORAL_TABLET | Freq: Once | ORAL | Status: DC

## 2018-09-15 MED ORDER — AZITHROMYCIN 250 MG PO TABS
1000.0000 mg | ORAL_TABLET | Freq: Once | ORAL | Status: AC
  Administered 2018-09-15: 18:00:00 1000 mg via ORAL
  Filled 2018-09-15: qty 4

## 2018-09-15 NOTE — Discharge Instructions (Addendum)
You have been treated for chlamydia today.  Your cultures are pending and you will be notified if your gonorrhea is positive, but I doubt since your girlfriends was negative.  You should abstain from sex for the next 7 days allowing the antibiotic time to clear your infection.

## 2018-09-15 NOTE — ED Triage Notes (Signed)
Pt states that he needs to be std he does not have any s/s

## 2018-09-15 NOTE — ED Provider Notes (Addendum)
[facility] EMERGENCY DEPARTMENT Provider Note   CSN: [id number] Arrival date & time: 09/15/18  1612    History   Chief Complaint Chief Complaint  Patient presents with  . Exposure to STD    HPI Matthew Alvarez is a 28 y.o. male presenting due to known exposure to chlamydia. His girlfriend who is currently pregnant tested positive for chlamydia after evaluation last week, testing positive for this yesterday and has been treated for this infection.  Her gonorrhea test was negative.  Pt denies any symptoms including n/v , fever, abdominal pain and penile dc but does endorse occasional burning with urination which is a chronic sx which he associates with too much soda intake. He does have unprotected sex with her, notes other relationships as well but uses condoms.       The history is provided by the patient.    Past Medical History:  Diagnosis Date  . GERD (gastroesophageal reflux disease)   . Hypertension     Patient Active Problem List   Diagnosis Date Noted  . CLOSED FRACTURE OF NECK OF RADIUS 07/09/2010    Past Surgical History:  Procedure Laterality Date  . TONSILLECTOMY AND ADENOIDECTOMY          Home Medications    Prior to Admission medications   Medication Sig Start Date End Date Taking? Authorizing Provider  benzonatate (TESSALON) 100 MG capsule Take 1 capsule (100 mg total) by mouth 3 (three) times daily as needed for cough. 08/01/18   Albrizze, Kaitlyn E, PA-C  ibuprofen (ADVIL,MOTRIN) 200 MG tablet Take 200 mg by mouth every 6 (six) hours as needed for mild pain or moderate pain.    [provider]    Family History No family history on file.  Social History Social History   Tobacco Use  . Smoking status: Never Smoker  . Smokeless tobacco: Never Used  Substance Use Topics  . Alcohol use: Yes    Comment: occ. use  . Drug use: No     Allergies   Penicillins   Review of Systems Review of Systems  Constitutional: Negative for  chills and fever.  HENT: Negative for congestion and sore throat.   Eyes: Negative.   Respiratory: Negative for chest tightness and shortness of breath.   Cardiovascular: Negative for chest pain.  Gastrointestinal: Negative for abdominal pain, nausea and vomiting.  Genitourinary: Positive for dysuria. Negative for discharge, frequency, genital sores, hematuria and penile pain.  Musculoskeletal: Negative for arthralgias, joint swelling and neck pain.  Skin: Negative.  Negative for rash and wound.  Neurological: Negative for dizziness, weakness, light-headedness, numbness and headaches.  Psychiatric/Behavioral: Negative.      Physical Exam Updated Vital Signs BP 140/83 (BP Location: Right Arm)   Pulse (!) 52   Temp (!) 97.5 F (36.4 C) (Temporal)   Resp 16   Ht 5\' 9"  (1.753 m)   Wt 102.1 kg   SpO2 99%   BMI 33.23 kg/m   Physical Exam Vitals signs and nursing note reviewed.  Constitutional:      Appearance: He is well-developed.  HENT:     Head: Normocephalic and atraumatic.  Eyes:     Conjunctiva/sclera: Conjunctivae normal.  Neck:     Musculoskeletal: Normal range of motion.  Cardiovascular:     Rate and Rhythm: Normal rate and regular rhythm.     Heart sounds: Normal heart sounds.  Pulmonary:     Effort: Pulmonary effort is normal.     Breath sounds: Normal  breath sounds. No wheezing.  Abdominal:     General: Bowel sounds are normal.     Palpations: Abdomen is soft.     Tenderness: There is no abdominal tenderness.  Musculoskeletal: Normal range of motion.  Skin:    General: Skin is warm and dry.  Neurological:     Mental Status: He is alert.      ED Treatments / Results  Labs (all labs ordered are listed, but only abnormal results are displayed) Labs Reviewed  URINALYSIS, ROUTINE W REFLEX MICROSCOPIC  GC/CHLAMYDIA PROBE AMP ([place]) NOT AT [facility]    EKG None  Radiology No results found.  Procedures Procedures (including critical care time)   Medications Ordered in ED Medications  azithromycin (ZITHROMAX) tablet 1,000 mg (has no administration in time range)     Initial Impression / Assessment and Plan / ED Course  I have reviewed the triage vital signs and the nursing notes.  Pertinent labs & imaging results that were available during my care of the patient were reviewed by me and considered in my medical decision making (see chart for details).        Pt with known exposure to chlamydia, no gonorrhea exposure.  Cultures pending.  He was treated for chlamydia here with zithromax, will hold on gonorrhea given sig allergy to PCN and knows was only exposed to chlamydia.  Pt advised to abstain x 7 days or until sx resolve.  Advised informing other contacts if his cx positive.  Prn f/u anticipated.  F/u health dept prn.  Final Clinical Impressions(s) / ED Diagnoses   Final diagnoses:  Exposure to chlamydia    ED Discharge Orders    None       Victoriano Lain 09/15/18 1738    Burgess Amor, PA-C 09/15/18 1738    Samuel Jester, DO 09/18/18 1554

## 2018-12-01 ENCOUNTER — Encounter (HOSPITAL_COMMUNITY): Payer: Self-pay | Admitting: Emergency Medicine

## 2018-12-01 ENCOUNTER — Emergency Department (HOSPITAL_COMMUNITY)
Admission: EM | Admit: 2018-12-01 | Discharge: 2018-12-01 | Disposition: A | Discharge status: Home / Self Care | Payer: Self-pay | Attending: Emergency Medicine | Admitting: Emergency Medicine

## 2018-12-01 ENCOUNTER — Other Ambulatory Visit: Payer: Self-pay

## 2018-12-01 DIAGNOSIS — J029 Acute pharyngitis, unspecified: Secondary | ICD-10-CM | POA: Insufficient documentation

## 2018-12-01 DIAGNOSIS — R1110 Vomiting, unspecified: Secondary | ICD-10-CM | POA: Insufficient documentation

## 2018-12-01 DIAGNOSIS — I10 Essential (primary) hypertension: Secondary | ICD-10-CM | POA: Insufficient documentation

## 2018-12-01 DIAGNOSIS — R0981 Nasal congestion: Secondary | ICD-10-CM | POA: Insufficient documentation

## 2018-12-01 NOTE — ED Provider Notes (Signed)
[facility]ERGENCY DEPARTMENT Provider Note   CSN: [id number] Arrival date & time: 12/01/18  2020     History   Chief Complaint Chief Complaint  Patient presents with  . Fever  . Nasal Congestion  . Emesis    HPI Matthew Alvarez is a 28 y.o. male.     The history is provided by the patient. No language interpreter was used.  Emesis Sore Throat This is a new problem. The current episode started yesterday. The problem has been resolved. Nothing aggravates the symptoms. Nothing relieves the symptoms. He has tried nothing for the symptoms. The treatment provided no relief.  Pt went home from work yesterday due to a sore throat.  Pt reports symptoms have resolved but he can not return to work without a note.  Pt denies fever, no cough   No covid exposure  Past Medical History:  Diagnosis Date  . GERD (gastroesophageal reflux disease)   . Hypertension     Patient Active Problem List   Diagnosis Date Noted  . CLOSED FRACTURE OF NECK OF RADIUS 07/09/2010    Past Surgical History:  Procedure Laterality Date  . TONSILLECTOMY AND ADENOIDECTOMY          Home Medications    Prior to Admission medications   Medication Sig Start Date End Date Taking? Authorizing Provider  benzonatate (TESSALON) 100 MG capsule Take 1 capsule (100 mg total) by mouth 3 (three) times daily as needed for cough. 08/01/18   Albrizze, Kaitlyn E, PA-C  ibuprofen (ADVIL,MOTRIN) 200 MG tablet Take 200 mg by mouth every 6 (six) hours as needed for mild pain or moderate pain.    [provider]    Family History History reviewed. No pertinent family history.  Social History Social History   Tobacco Use  . Smoking status: Never Smoker  . Smokeless tobacco: Never Used  Substance Use Topics  . Alcohol use: Yes    Comment: occ. use  . Drug use: No     Allergies   Penicillins   Review of Systems Review of Systems  All other systems reviewed and are negative.    Physical Exam  Updated Vital Signs BP (!) 153/81   Pulse 83   Temp 99.4 F (37.4 C) (Oral)   Resp 17   SpO2 98%   Physical Exam Vitals signs and nursing note reviewed.  Constitutional:      Appearance: He is well-developed.  HENT:     Head: Normocephalic and atraumatic.     Right Ear: Tympanic membrane normal.     Left Ear: Tympanic membrane normal.     Nose: Nose normal.     Mouth/Throat:     Mouth: Mucous membranes are moist.  Eyes:     Conjunctiva/sclera: Conjunctivae normal.  Neck:     Musculoskeletal: Neck supple.  Cardiovascular:     Rate and Rhythm: Normal rate and regular rhythm.     Heart sounds: No murmur.  Pulmonary:     Effort: Pulmonary effort is normal. No respiratory distress.     Breath sounds: Normal breath sounds.  Abdominal:     Palpations: Abdomen is soft.     Tenderness: There is no abdominal tenderness.  Musculoskeletal: Normal range of motion.  Skin:    General: Skin is warm and dry.  Neurological:     General: No focal deficit present.     Mental Status: He is alert.      ED Treatments / Results  Labs (all labs  ordered are listed, but only abnormal results are displayed) Labs Reviewed - No data to display  EKG    Radiology No results found.  Procedures Procedures (including critical care time)  Medications Ordered in ED Medications - No data to display   Initial Impression / Assessment and Plan / ED Course  I have reviewed the triage vital signs and the nursing notes.  Pertinent labs & imaging results that were available during my care of the patient were reviewed by me and considered in my medical decision making (see chart for details).        MDM  Pt has no current symptoms.  I think covid is unlikely.  Conard Novak  Final Clinical Impressions(s) / ED Diagnoses   Final diagnoses:  [place]    ED Discharge Orders    None    An After Visit Summary was printed and given to the patient.    Sidney Ace 12/01/18 2130     Milton Ferguson, MD 12/02/18 1626

## 2018-12-01 NOTE — Discharge Instructions (Addendum)
Return if any problems.

## 2018-12-01 NOTE — ED Triage Notes (Signed)
Pt c/o n/v last night x 4 but none since. C/o "sinuses", nose running, denies cough. Clear drainage from nose. Nad. No sob noted.

## 2019-01-11 ENCOUNTER — Other Ambulatory Visit: Payer: Self-pay

## 2019-01-11 DIAGNOSIS — Z20822 Contact with and (suspected) exposure to covid-19: Secondary | ICD-10-CM

## 2019-01-13 ENCOUNTER — Telehealth: Payer: Self-pay | Admitting: General Practice

## 2019-01-13 LAB — NOVEL CORONAVIRUS, NAA: SARS-CoV-2, NAA: NOT DETECTED

## 2019-01-13 NOTE — Telephone Encounter (Signed)
Pt received Negative covid test results.

## 2019-05-24 ENCOUNTER — Emergency Department (HOSPITAL_COMMUNITY)
Admission: EM | Admit: 2019-05-24 | Discharge: 2019-05-24 | Disposition: A | Discharge status: Home / Self Care | Payer: Self-pay | Attending: Emergency Medicine | Admitting: Emergency Medicine

## 2019-05-24 ENCOUNTER — Other Ambulatory Visit: Payer: Self-pay

## 2019-05-24 ENCOUNTER — Encounter (HOSPITAL_COMMUNITY): Payer: Self-pay | Admitting: Emergency Medicine

## 2019-05-24 ENCOUNTER — Emergency Department (HOSPITAL_COMMUNITY): Payer: Self-pay

## 2019-05-24 DIAGNOSIS — I10 Essential (primary) hypertension: Secondary | ICD-10-CM | POA: Insufficient documentation

## 2019-05-24 DIAGNOSIS — R079 Chest pain, unspecified: Secondary | ICD-10-CM | POA: Insufficient documentation

## 2019-05-24 LAB — CBC WITH DIFFERENTIAL/PLATELET
Abs Immature Granulocytes: 0.02 10*3/uL (ref 0.00–0.07)
Basophils Absolute: 0 10*3/uL (ref 0.0–0.1)
Basophils Relative: 0 %
Eosinophils Absolute: 0.2 10*3/uL (ref 0.0–0.5)
Eosinophils Relative: 2 %
HCT: 44.4 % (ref 39.0–52.0)
Hemoglobin: 14.4 g/dL (ref 13.0–17.0)
Immature Granulocytes: 0 %
Lymphocytes Relative: 44 %
Lymphs Abs: 4.2 10*3/uL — ABNORMAL HIGH (ref 0.7–4.0)
MCH: 27.6 pg (ref 26.0–34.0)
MCHC: 32.4 g/dL (ref 30.0–36.0)
MCV: 85.1 fL (ref 80.0–100.0)
Monocytes Absolute: 0.5 10*3/uL (ref 0.1–1.0)
Monocytes Relative: 5 %
Neutro Abs: 4.5 10*3/uL (ref 1.7–7.7)
Neutrophils Relative %: 49 %
Platelets: 271 10*3/uL (ref 150–400)
RBC: 5.22 MIL/uL (ref 4.22–5.81)
RDW: 12.8 % (ref 11.5–15.5)
WBC: 9.5 10*3/uL (ref 4.0–10.5)
nRBC: 0 % (ref 0.0–0.2)

## 2019-05-24 LAB — BASIC METABOLIC PANEL
Anion gap: 11 (ref 5–15)
BUN: 17 mg/dL (ref 6–20)
CO2: 26 mmol/L (ref 22–32)
Calcium: 9.4 mg/dL (ref 8.9–10.3)
Chloride: 100 mmol/L (ref 98–111)
Creatinine, Ser: 1.13 mg/dL (ref 0.61–1.24)
GFR calc Af Amer: >60 mL/min (ref >60)
GFR calc non Af Amer: >60 mL/min (ref >60)
Glucose, Bld: 97 mg/dL (ref 70–99)
Potassium: 3.8 mmol/L (ref 3.5–5.1)
Sodium: 137 mmol/L (ref 135–145)

## 2019-05-24 LAB — TROPONIN I (HIGH SENSITIVITY): Troponin I (High Sensitivity): 3 ng/L (ref <18)

## 2019-05-24 MED ORDER — KETOROLAC TROMETHAMINE 30 MG/ML IJ SOLN
30.0000 mg | Freq: Once | INTRAMUSCULAR | Status: AC
  Administered 2019-05-24: 30 mg via INTRAVENOUS
  Filled 2019-05-24: qty 1

## 2019-05-24 NOTE — Discharge Instructions (Addendum)
All tests were good.  Do not suspect this is related to your heart.  Follow-up with your primary care doctor.

## 2019-05-24 NOTE — ED Provider Notes (Signed)
[facility] EMERGENCY DEPARTMENT Provider Note   CSN: [id number] Arrival date & time: 05/24/19  2119     History   Chief Complaint Chief Complaint  Patient presents with  . Chest Pain    HPI Matthew Alvarez is a 28 y.o. male.  HPI: A 28 year old patient with a history of hypertension presents for evaluation of chest pain. Initial onset of pain was more than 6 hours ago. The patient's chest pain is described as heaviness/pressure/tightness and is worse with exertion. The patient's chest pain is not middle- or left-sided, is not well-localized, is not sharp and does not radiate to the arms/jaw/neck. The patient does not complain of nausea and denies diaphoresis. The patient has no history of stroke, has no history of peripheral artery disease, has not smoked in the past 90 days, denies any history of treated diabetes, has no relevant family history of coronary artery disease (first degree relative at less than age 3), has no history of hypercholesterolemia and does not have an elevated BMI (>=30).   HPI Patient presents with chest pain.  He reports he has had ongoing chest pressure for 2 days.  It is worse with movement He denies fever/cough/shortness of breath.  No diaphoresis or vomiting He was at work tonight and seemed to be continuing so he was evaluated.  He took omeprazole yesterday without any improvement.  Past Medical History:  Diagnosis Date  . GERD (gastroesophageal reflux disease)   . Hypertension     Patient Active Problem List   Diagnosis Date Noted  . CLOSED FRACTURE OF NECK OF RADIUS 07/09/2010    Past Surgical History:  Procedure Laterality Date  . TONSILLECTOMY AND ADENOIDECTOMY          Home Medications    Prior to Admission medications   Not on File    Family History History reviewed. No pertinent family history.  Social History Social History   Tobacco Use  . Smoking status: Never Smoker  . Smokeless tobacco: Never Used  Substance Use  Topics  . Alcohol use: Yes    Comment: occ. use  . Drug use: No     Allergies   Penicillins   Review of Systems Review of Systems  Constitutional: Negative for diaphoresis and fever.  Respiratory: Negative for shortness of breath.   Cardiovascular: Positive for chest pain. Negative for leg swelling.  Gastrointestinal: Negative for vomiting.  All other systems reviewed and are negative.    Physical Exam Updated Vital Signs BP 122/66 (BP Location: Right Arm)   Pulse (!) 45   Temp 97.9 F (36.6 C)   Resp 16   Ht 1.753 m (5\' 9" )   Wt 102.1 kg   SpO2 97%   BMI 33.23 kg/m   Physical Exam CONSTITUTIONAL: Well developed/well nourished HEAD: Normocephalic/atraumatic EYES: EOMI ENMT: Mask in place NECK: supple no meningeal signs SPINE/BACK:entire spine nontender CV: S1/S2 noted, no murmurs/rubs/gallops noted LUNGS: Lungs are clear to auscultation bilaterally, no apparent distress ABDOMEN: soft, nontender NEURO: Pt is awake/alert/appropriate, moves all extremitiesx4.  No facial droop.   EXTREMITIES: pulses normal/equalx4, full ROM, no lower extremity SKIN: warm, color normal PSYCH: no abnormalities of mood noted, alert and oriented to situation   ED Treatments / Results  Labs (all labs ordered are listed, but only abnormal results are displayed) Labs Reviewed  BASIC METABOLIC PANEL  CBC WITH DIFFERENTIAL/PLATELET  TROPONIN I (HIGH SENSITIVITY)    EKG EKG Interpretation  Date/Time:  Tuesday May 24 2019 06:36:16 EST Ventricular Rate:  54 PR Interval:    QRS Duration: 98 QT Interval:  433 QTC Calculation: 411 R Axis:   22 Text Interpretation: Sinus rhythm ST elev, probable normal early repol pattern No significant change since last tracing Confirmed by Ripley Fraise [phone]) on 05/24/2019 6:45:05 AM   Radiology No results found.  Procedures Procedures   Medications Ordered in ED Medications  ketorolac (TORADOL) 30 MG/ML injection 30 mg (30 mg  Intravenous Given 05/24/19 0649)     Initial Impression / Assessment and Plan / ED Course  I have reviewed the triage vital signs and the nursing notes.  Pertinent labs & imaging results that were available during my care of the patient were reviewed by me and considered in my medical decision making (see chart for details).     HEAR Score: 3 7:04 AM Pt with CP, stable at this time Plan at signout dr cook - f/u on labs/imaging and if negative he can be discharged. Final Clinical Impressions(s) / ED Diagnoses   Final diagnoses:  None    ED Discharge Orders    None       Ripley Fraise, MD 05/24/19 [phone]

## 2019-05-24 NOTE — ED Triage Notes (Signed)
Patient states chest pain that started 2 days ago and became worse this am. Patient stats some shortness of breath earlier but none at this time.

## 2019-05-30 ENCOUNTER — Other Ambulatory Visit: Payer: Self-pay

## 2019-05-30 ENCOUNTER — Emergency Department (HOSPITAL_COMMUNITY): Payer: Self-pay

## 2019-05-30 ENCOUNTER — Encounter (HOSPITAL_COMMUNITY): Payer: Self-pay | Admitting: Emergency Medicine

## 2019-05-30 ENCOUNTER — Emergency Department (HOSPITAL_COMMUNITY)
Admission: EM | Admit: 2019-05-30 | Discharge: 2019-05-30 | Disposition: A | Discharge status: Home / Self Care | Payer: Self-pay | Attending: Emergency Medicine | Admitting: Emergency Medicine

## 2019-05-30 DIAGNOSIS — R1031 Right lower quadrant pain: Secondary | ICD-10-CM | POA: Insufficient documentation

## 2019-05-30 DIAGNOSIS — I10 Essential (primary) hypertension: Secondary | ICD-10-CM | POA: Insufficient documentation

## 2019-05-30 DIAGNOSIS — R109 Unspecified abdominal pain: Secondary | ICD-10-CM

## 2019-05-30 LAB — URINALYSIS, ROUTINE W REFLEX MICROSCOPIC
Bilirubin Urine: NEGATIVE
Glucose, UA: NEGATIVE mg/dL
Hgb urine dipstick: NEGATIVE
Ketones, ur: NEGATIVE mg/dL
Leukocytes,Ua: NEGATIVE
Nitrite: NEGATIVE
Protein, ur: NEGATIVE mg/dL
Specific Gravity, Urine: 1.013 (ref 1.005–1.030)
pH: 7 (ref 5.0–8.0)

## 2019-05-30 LAB — CBC WITH DIFFERENTIAL/PLATELET
Abs Immature Granulocytes: 0.03 10*3/uL (ref 0.00–0.07)
Basophils Absolute: 0 10*3/uL (ref 0.0–0.1)
Basophils Relative: 0 %
Eosinophils Absolute: 0.1 10*3/uL (ref 0.0–0.5)
Eosinophils Relative: 2 %
HCT: 45.7 % (ref 39.0–52.0)
Hemoglobin: 14.3 g/dL (ref 13.0–17.0)
Immature Granulocytes: 0 %
Lymphocytes Relative: 45 %
Lymphs Abs: 4 10*3/uL (ref 0.7–4.0)
MCH: 27 pg (ref 26.0–34.0)
MCHC: 31.3 g/dL (ref 30.0–36.0)
MCV: 86.2 fL (ref 80.0–100.0)
Monocytes Absolute: 0.6 10*3/uL (ref 0.1–1.0)
Monocytes Relative: 7 %
Neutro Abs: 4.1 10*3/uL (ref 1.7–7.7)
Neutrophils Relative %: 46 %
Platelets: 266 10*3/uL (ref 150–400)
RBC: 5.3 MIL/uL (ref 4.22–5.81)
RDW: 12.7 % (ref 11.5–15.5)
WBC: 8.9 10*3/uL (ref 4.0–10.5)
nRBC: 0 % (ref 0.0–0.2)

## 2019-05-30 LAB — COMPREHENSIVE METABOLIC PANEL
ALT: 22 U/L (ref 0–44)
AST: 23 U/L (ref 15–41)
Albumin: 4.1 g/dL (ref 3.5–5.0)
Alkaline Phosphatase: 90 U/L (ref 38–126)
Anion gap: 5 (ref 5–15)
BUN: 11 mg/dL (ref 6–20)
CO2: 29 mmol/L (ref 22–32)
Calcium: 8.8 mg/dL — ABNORMAL LOW (ref 8.9–10.3)
Chloride: 105 mmol/L (ref 98–111)
Creatinine, Ser: 0.89 mg/dL (ref 0.61–1.24)
GFR calc Af Amer: >60 mL/min (ref >60)
GFR calc non Af Amer: >60 mL/min (ref >60)
Glucose, Bld: 83 mg/dL (ref 70–99)
Potassium: 3.6 mmol/L (ref 3.5–5.1)
Sodium: 139 mmol/L (ref 135–145)
Total Bilirubin: 0.6 mg/dL (ref 0.3–1.2)
Total Protein: 7.8 g/dL (ref 6.5–8.1)

## 2019-05-30 MED ORDER — METHOCARBAMOL 500 MG PO TABS: 500.0000 mg | ORAL_TABLET | Freq: Two times a day (BID) | ORAL | 0 refills | Status: DC

## 2019-05-30 MED ORDER — MORPHINE SULFATE (PF) 4 MG/ML IV SOLN
4.0000 mg | Freq: Once | INTRAVENOUS | Status: AC
  Administered 2019-05-30: 17:00:00 4 mg via INTRAVENOUS
  Filled 2019-05-30: qty 1

## 2019-05-30 MED ORDER — LIDOCAINE 5 % EX PTCH: 1.0000 | MEDICATED_PATCH | CUTANEOUS | 0 refills | Status: DC

## 2019-05-30 MED ORDER — SODIUM CHLORIDE 0.9 % IV BOLUS
1000.0000 mL | Freq: Once | INTRAVENOUS | Status: AC
  Administered 2019-05-30: 17:00:00 1000 mL via INTRAVENOUS

## 2019-05-30 MED ORDER — ONDANSETRON HCL 4 MG/2ML IJ SOLN
4.0000 mg | Freq: Once | INTRAMUSCULAR | Status: AC
  Administered 2019-05-30: 4 mg via INTRAVENOUS
  Filled 2019-05-30: qty 2

## 2019-05-30 MED ORDER — KETOROLAC TROMETHAMINE 30 MG/ML IJ SOLN
30.0000 mg | Freq: Once | INTRAMUSCULAR | Status: AC
  Administered 2019-05-30: 30 mg via INTRAVENOUS
  Filled 2019-05-30: qty 1

## 2019-05-30 MED ORDER — HYDROMORPHONE HCL 1 MG/ML IJ SOLN
1.0000 mg | Freq: Once | INTRAMUSCULAR | Status: AC
  Administered 2019-05-30: 1 mg via INTRAVENOUS
  Filled 2019-05-30: qty 1

## 2019-05-30 NOTE — ED Triage Notes (Signed)
Pt reports right flank pain starting last night at work. No injury, no urinary s/s.

## 2019-05-30 NOTE — ED Provider Notes (Signed)
[facility] EMERGENCY DEPARTMENT Provider Note   CSN: [id number] Arrival date & time: 05/30/19  1505     History Chief Complaint  Patient presents with  . Flank Pain    Jaivyn L Shanholtzer is a 28 y.o. male with possible history significant for GERD, hypertension who presents for evaluation of right.  Patient states when he was at work he developed right-sided flank pain.  Is worse with movement.  Has been taking Tylenol for his symptoms.  States he did notice some dark urine however no gross hematuria.  No prior history of stones.  No recent injury or trauma.  No fever, chills, nausea, vomiting, chest pain, shortness of breath, abdominal pain, dysuria, diarrhea or constipation.  No IV drug use, bowel or bladder incontinence, saddle paresthesias.  Pain does not radiate into his groin, lower extremities.  He has been ambulatory that difficulty.  Denies additional aggravating or alleviating factors.  History obtained from patient and past medical records.  No interpreter is used.  HPI     Past Medical History:  Diagnosis Date  . GERD (gastroesophageal reflux disease)   . Hypertension     Patient Active Problem List   Diagnosis Date Noted  . CLOSED FRACTURE OF NECK OF RADIUS 07/09/2010    Past Surgical History:  Procedure Laterality Date  . TONSILLECTOMY AND ADENOIDECTOMY         History reviewed. No pertinent family history.  Social History   Tobacco Use  . Smoking status: Never Smoker  . Smokeless tobacco: Never Used  Substance Use Topics  . Alcohol use: Yes    Comment: occ. use  . Drug use: No    Home Medications Prior to Admission medications   Medication Sig Start Date End Date Taking? Authorizing Provider  acetaminophen (TYLENOL) 500 MG tablet Take 500 mg by mouth every 6 (six) hours as needed for mild pain or moderate pain.   Yes [provider]  omeprazole (PRILOSEC) 20 MG capsule Take 20 mg by mouth daily as needed (for acid reflux).   Yes [provider]  lidocaine (LIDODERM) 5 % Place 1 patch onto the skin daily. Remove & Discard patch within 12 hours or as directed by MD 05/30/19   Dallie Patton A, PA-C  methocarbamol (ROBAXIN) 500 MG tablet Take 1 tablet (500 mg total) by mouth 2 (two) times daily. 05/30/19   Nathali Vent A, PA-C    Allergies    Penicillins  Review of Systems   Review of Systems  Constitutional: Negative.   HENT: Negative.   Respiratory: Negative.   Cardiovascular: Negative.   Gastrointestinal: Negative.   Genitourinary: Positive for flank pain. Negative for decreased urine volume, difficulty urinating, discharge, dysuria, enuresis, frequency, genital sores, penile pain, penile swelling, scrotal swelling, testicular pain and urgency. Hematuria: Dark colored urine.  Skin: Negative.   Neurological: Negative.   All other systems reviewed and are negative.   Physical Exam Updated Vital Signs BP (!) 167/91 (BP Location: Right Arm)   Pulse (!) 58   Temp 98.1 F (36.7 C) (Oral)   Resp 16   Ht  (1.753 m)   Wt 102.1 kg   SpO2 98%   BMI 33.23 kg/m   Physical Exam Vitals and nursing note reviewed.  Constitutional:      General: He is not in acute distress.    Appearance: He is well-developed. He is not ill-appearing, toxic-appearing or diaphoretic.  HENT:     Head: Normocephalic and atraumatic.  Nose: Nose normal.     Mouth/Throat:     Mouth: Mucous membranes are moist.     Pharynx: Oropharynx is clear.  Eyes:     Pupils: Pupils are equal, round, and reactive to light.  Cardiovascular:     Rate and Rhythm: Normal rate and regular rhythm.     Pulses: Normal pulses.     Heart sounds: Normal heart sounds.  Pulmonary:     Effort: Pulmonary effort is normal. No respiratory distress.     Breath sounds: Normal breath sounds.  Abdominal:     General: Bowel sounds are normal. There is no distension.     Palpations: Abdomen is soft.     Tenderness: There is no abdominal tenderness.  There is right CVA tenderness. There is no guarding or rebound. Negative signs include Murphy's sign and McBurney's sign.     Hernia: No hernia is present.     Comments: Soft, non tender without rebound or guarding. Right sided CVA tap.  Musculoskeletal:        General: Normal range of motion.     Cervical back: Normal, normal range of motion and neck supple.     Thoracic back: Normal.     Lumbar back: Normal.       Back:     Comments: Moves all 4 extremities without difficulty. Negative straight leg raise. No midline tenderness to palpation to spine. No crepitus, strep offs.  Skin:    General: Skin is warm and dry.     Capillary Refill: Capillary refill takes less than 2 seconds.     Comments: No edema, erythema or warmth.  No vesicles or lesions.  Neurological:     General: No focal deficit present.     Mental Status: He is alert.     Cranial Nerves: Cranial nerves are intact.     Sensory: Sensation is intact.     Motor: Motor function is intact.     Coordination: Coordination is intact.     Gait: Gait is intact.     Comments: Ambulatory without difficulty.     ED Results / Procedures / Treatments   Labs (all labs ordered are listed, but only abnormal results are displayed) Labs Reviewed  COMPREHENSIVE METABOLIC PANEL - Abnormal; Notable for the following components:      Result Value   Calcium 8.8 (*)    All other components within normal limits  URINALYSIS, ROUTINE W REFLEX MICROSCOPIC - Abnormal; Notable for the following components:   Color, Urine STRAW (*)    All other components within normal limits  CBC WITH DIFFERENTIAL/PLATELET    EKG None  Radiology CT Renal Stone Study  Result Date: 05/30/2019 CLINICAL DATA:  Flank pain.  Foreign body. EXAM: CT ABDOMEN AND PELVIS WITHOUT CONTRAST TECHNIQUE: Multidetector CT imaging of the abdomen and pelvis was performed following the standard protocol without IV contrast. COMPARISON:  05/30/2019 at 5:20 p.m. FINDINGS:  Lower chest: The lung bases are clear. The heart size is normal. Incidentally noted is bilateral gynecomastia. Hepatobiliary: The liver is normal. Normal gallbladder.There is no biliary ductal dilation. Pancreas: Normal contours without ductal dilatation. No peripancreatic fluid collection. Spleen: No splenic laceration or hematoma. Adrenals/Urinary Tract: --Adrenal glands: No adrenal hemorrhage. --Right kidney/ureter: No hydronephrosis or perinephric hematoma. --Left kidney/ureter: No hydronephrosis or perinephric hematoma. --Urinary bladder: Unremarkable. Stomach/Bowel: --Stomach/Duodenum: No hiatal hernia or other gastric abnormality. Normal duodenal course and caliber. --Small bowel: No dilatation or inflammation. --Colon: The previously noted foreign bodies now reside within  the cecum. There is no evidence for a colonic obstruction. --Appendix: Normal. Vascular/Lymphatic: Normal course and caliber of the major abdominal vessels. --No retroperitoneal lymphadenopathy. --No mesenteric lymphadenopathy. --No pelvic or inguinal lymphadenopathy. Reproductive: Unremarkable Other: No ascites or free air. There is a small fat containing umbilical hernia. Musculoskeletal. No acute displaced fractures. IMPRESSION: The previously noted foreign bodies now reside in the cecum. Otherwise, no significant short interval change. Electronically Signed   By: Katherine Mantle M.D.   On: 05/30/2019 21:32   CT Renal Stone Study  Result Date: 05/30/2019 CLINICAL DATA:  Flank pain, kidney stone suspected along the right flank. EXAM: CT ABDOMEN AND PELVIS WITHOUT CONTRAST TECHNIQUE: Multidetector CT imaging of the abdomen and pelvis was performed following the standard protocol without IV contrast. COMPARISON:  CT abdomen and pelvis of 08/02/2015 FINDINGS: Lower chest: Lung bases are clear. No signs of consolidation or evidence of pleural effusion. Hepatobiliary: Normal liver. No signs of biliary ductal distension. Pancreas:  Pancreas is unremarkable. Spleen: Spleen is normal. Adrenals/Urinary Tract: Adrenal glands are unremarkable with smooth contours of the bilateral kidneys, no signs of perinephric stranding, nephro or ureterolithiasis. Stomach/Bowel: Collection of radiopaque very dense material in the terminal ileum. These are geometric and atypical in appearance for ingested medications. Mild distention of the terminal ileum. The appendix is normal. There is no sign of small bowel obstruction. No perienteric stranding. Vascular/Lymphatic: Normal appearance of abdominal aorta based on noncontrast assessment. No adenopathy in the retroperitoneum or upper abdomen. No signs of pelvic lymphadenopathy. Reproductive: Normal appearance of the prostate. Other: Small umbilical hernia contains fat. Musculoskeletal: No signs of acute musculoskeletal process. IMPRESSION: 1. Collection of radiopaque very dense material in the terminal ileum, which are geometric and atypical in appearance for ingested medications. Correlate with any potential ingestion of foreign bodies other than medications. Significance is uncertain but in the area of reported pain. Follow-up imaging with CT may be helpful based on clinical symptoms to ensure that this passes into the fecal stream in the colon. 2. Mild distention of the terminal ileum, without evidence of small bowel obstruction. 3. Small umbilical hernia contains fat. 4. These results were called by telephone at the time of interpretation on 05/30/2019 at 6:06 pm to provider [facility] , who verbally acknowledged these results. Electronically Signed   By: Donzetta Kohut M.D.   On: 05/30/2019 18:07    Procedures Procedures (including critical care time)  Medications Ordered in ED Medications  sodium chloride 0.9 % bolus 1,000 mL (0 mLs Intravenous Stopped 05/30/19 1821)  morphine 4 MG/ML injection 4 mg (4 mg Intravenous Given 05/30/19 1711)  ondansetron (ZOFRAN) injection 4 mg (4 mg Intravenous  Given 05/30/19 1711)  ketorolac (TORADOL) 30 MG/ML injection 30 mg (30 mg Intravenous Given 05/30/19 2059)  HYDROmorphone (DILAUDID) injection 1 mg (1 mg Intravenous Given 05/30/19 2059)    ED Course  I have reviewed the triage vital signs and the nursing notes.  Pertinent labs & imaging results that were available during my care of the patient were reviewed by me and considered in my medical decision making (see chart for details).  28 year old presents for evaluation of right-sided flank pain.  Worse with movement.  No prior history of stones.  Did notice some "dark" urine however no gross hematuria.  No recent injury or trauma.  Afebrile, nonseptic, not ill-appearing.  Negative Murphy sign, abdomen soft, nontender.  Positive CVA tap on right.  No red flags for back pain.  Tolerating p.o. intake at  home without difficulty.  1800: CONSULT with Dr. Lorriane Shire with radiology.  Patient with radiopaque possible foreign body to right lower abdomen however unclear of object.  Thorough discussion with patient he denies any ingestion of any substances, did take Tylenol at 8 AM this morning however none since.  Radiology recommends rescanning patient after a couple hours to make sure foreign body is passing.   2000: Pain improved. Discussed repeat CT per Radiology recommendations.  2050: Patient requesting additional pain medications. Abdomen continues to be soft, nontender without rebound or guarding. Pending Repeat CT AP.  Labs personally reviewed and interpreted: CBC without leukocytosis CMP without electrolyte, renal, or liver abnormality Urinalysis negative for infection, no Hgb  Patient reassessed, pain located towards upper mid flank.  He has no lower back pain.  Abdominal exam continues without tenderness, rebound or guarding. No overlying skin changes.  Discussed repeat CT images as possible foreign body has moved into the large intestine as well as discussion with attending physician, Dr. Stark Jock.   Recommends pain management, follow-up outpatient.  Given 9 able to reproduce his right flank pain and feels is more musculoskeletal in nature.  Question incidental finding of this retained foreign body.  Patient continues to deny any ingestion of any abnormal substances besides the Pepcid and the Tylenol he took earlier.  Strict return precautions with patient discussed if he has any emesis, develops abdominal pain he will seek reevaluation emergency department at this time.  Did not think patient requires emergent surgical intervention or admission to the hospital at this time.  Patient without any emesis, nausea, he is passing flatulence.  Treat for musculoskeletal pain.  Discussed with patient to monitor stool for possible foreign bodies.  Hematochezia per patient.  Patient is nontoxic, nonseptic appearing, in no apparent distress.  Patient's pain and other symptoms adequately managed in emergency department.  Fluid bolus given.  Labs, imaging and vitals reviewed.  Patient does not meet the SIRS or Sepsis criteria.  On repeat exam patient does not have a surgical abdomin and there are no peritoneal signs.  No indication of appendicitis, bowel obstruction, bowel perforation, cholecystitis, diverticulitis.  The patient has been appropriately medically screened and/or stabilized in the ED. I have low suspicion for any other emergent medical condition which would require further screening, evaluation or treatment in the ED or require inpatient management.  Patient is hemodynamically stable and in no acute distress.  Patient able to ambulate in department prior to ED.  Evaluation does not show acute pathology that would require ongoing or additional emergent interventions while in the emergency department or further inpatient treatment.  I have discussed the diagnosis with the patient and answered all questions.  Pain is been managed while in the emergency department and patient has no further complaints prior  to discharge.  Patient is comfortable with plan discussed in room and is stable for discharge at this time.  I have discussed strict return precautions for returning to the emergency department.  Patient was encouraged to follow-up with PCP/specialist refer to at discharge.   Patient discussed with attending physician, Dr. Stark Jock who agrees with above treatment, plan and disposition.   MDM Rules/Calculators/A&P                      Final Clinical Impression(s) / ED Diagnoses Final diagnoses:  Right flank pain    Rx / DC Orders ED Discharge Orders         Ordered    methocarbamol (ROBAXIN)  500 MG tablet  2 times daily     05/30/19 2139    lidocaine (LIDODERM) 5 %  Every 24 hours     05/30/19 2139           Esha Fincher A, PA-C 05/30/19 2143    Geoffery Lyonselo, [place], MD 05/30/19 2254

## 2019-05-30 NOTE — Discharge Instructions (Signed)
If you develop abdominal pain, vomiting, inability to pass stool or severe constipation please seek reevaluation emergency department

## 2019-05-31 ENCOUNTER — Encounter (HOSPITAL_COMMUNITY): Payer: Self-pay | Admitting: Emergency Medicine

## 2019-05-31 ENCOUNTER — Other Ambulatory Visit: Payer: Self-pay

## 2019-05-31 ENCOUNTER — Emergency Department (HOSPITAL_COMMUNITY)
Admission: EM | Admit: 2019-05-31 | Discharge: 2019-05-31 | Disposition: A | Discharge status: Home / Self Care | Payer: Self-pay | Attending: Emergency Medicine | Admitting: Emergency Medicine

## 2019-05-31 DIAGNOSIS — R109 Unspecified abdominal pain: Secondary | ICD-10-CM | POA: Insufficient documentation

## 2019-05-31 DIAGNOSIS — I10 Essential (primary) hypertension: Secondary | ICD-10-CM | POA: Insufficient documentation

## 2019-05-31 MED ORDER — HYDROCODONE-ACETAMINOPHEN 5-325 MG PO TABS: 2.0000 | ORAL_TABLET | ORAL | 0 refills | Status: DC | PRN

## 2019-05-31 MED ORDER — PREDNISONE 10 MG PO TABS: 20.0000 mg | ORAL_TABLET | Freq: Two times a day (BID) | ORAL | 0 refills | Status: DC

## 2019-05-31 MED ORDER — KETOROLAC TROMETHAMINE 60 MG/2ML IM SOLN
60.0000 mg | Freq: Once | INTRAMUSCULAR | Status: DC
  Filled 2019-05-31: qty 2

## 2019-05-31 MED ORDER — HYDROCODONE-ACETAMINOPHEN 5-325 MG PO TABS
2.0000 | ORAL_TABLET | Freq: Once | ORAL | Status: AC
  Administered 2019-05-31: 23:00:00 2 via ORAL
  Filled 2019-05-31: qty 2

## 2019-05-31 NOTE — ED Triage Notes (Signed)
Pt reports of right sided flank pain, seen for same yesterday

## 2019-05-31 NOTE — Discharge Instructions (Signed)
Prednisone as prescribed.  Begin taking hydrocodone as prescribed as needed for pain.  Follow-up with your primary doctor if symptoms or not improving in the next week.

## 2019-05-31 NOTE — ED Provider Notes (Signed)
[facility] EMERGENCY DEPARTMENT Provider Note   CSN: [id number] Arrival date & time: 05/31/19  2038     History Chief Complaint  Patient presents with  . Flank Pain    Matthew Alvarez is a 28 y.o. male.  Patient is a 28 year old male with past medical history of GERD and hypertension.  He presents today for evaluation of right flank pain.  Patient was seen here yesterday for presumed kidney stone.  He underwent a renal CT which showed what appeared to be ingested foreign body in his ileum.  A follow-up CT scan was obtained which shows this material into the rectum.  Patient was discharged with musculoskeletal flank pain.  He was given Lidoderm patches and muscle relaxers, but is not having any relief.  His pain is worse when he bends, twists or turns, and palpates the area.  He denies any radiation into his legs.  He denies any bowel or bladder complaints.  The history is provided by the patient.  Flank Pain This is a new problem. The current episode started 2 days ago. The problem occurs constantly. The problem has not changed since onset.Pertinent negatives include no abdominal pain. Exacerbated by: movement and palpation. Nothing relieves the symptoms.       Past Medical History:  Diagnosis Date  . GERD (gastroesophageal reflux disease)   . Hypertension     Patient Active Problem List   Diagnosis Date Noted  . CLOSED FRACTURE OF NECK OF RADIUS 07/09/2010    Past Surgical History:  Procedure Laterality Date  . TONSILLECTOMY AND ADENOIDECTOMY         No family history on file.  Social History   Tobacco Use  . Smoking status: Never Smoker  . Smokeless tobacco: Never Used  Substance Use Topics  . Alcohol use: Yes    Comment: occ. use  . Drug use: No    Home Medications Prior to Admission medications   Medication Sig Start Date End Date Taking? Authorizing Provider  acetaminophen (TYLENOL) 500 MG tablet Take 500 mg by mouth every 6 (six) hours as needed for  mild pain or moderate pain.   Yes [provider]  lidocaine (LIDODERM) 5 % Place 1 patch onto the skin daily. Remove & Discard patch within 12 hours or as directed by MD 05/30/19  Yes Henderly, Britni A, PA-C  methocarbamol (ROBAXIN) 500 MG tablet Take 1 tablet (500 mg total) by mouth 2 (two) times daily. 05/30/19  Yes Henderly, Britni A, PA-C  omeprazole (PRILOSEC) 20 MG capsule Take 20 mg by mouth daily as needed (for acid reflux).   Yes [provider]    Allergies    Penicillins  Review of Systems   Review of Systems  Gastrointestinal: Negative for abdominal pain.  Genitourinary: Positive for flank pain.  All other systems reviewed and are negative.   Physical Exam Updated Vital Signs BP (!) 146/66 (BP Location: Right Arm)   Pulse 66   Temp 98.7 F (37.1 C) (Oral)   Resp 20   Ht 5\' 9"  (1.753 m)   Wt 102.1 kg   SpO2 100%   BMI 33.23 kg/m   Physical Exam Vitals and nursing note reviewed.  Constitutional:      General: He is not in acute distress.    Appearance: Normal appearance. He is not ill-appearing, toxic-appearing or diaphoretic.  HENT:     Head: Normocephalic and atraumatic.  Cardiovascular:     Rate and Rhythm: Normal rate and regular rhythm.  Pulses: Normal pulses.     Heart sounds: No murmur.  Pulmonary:     Effort: Pulmonary effort is normal.  Abdominal:     General: Abdomen is flat. There is no distension.     Tenderness: There is no abdominal tenderness. There is no guarding or rebound.     Comments: There is tenderness to palpation of the right flank.  Skin:    General: Skin is warm and dry.  Neurological:     General: No focal deficit present.     Mental Status: He is alert and oriented to person, place, and time.     Comments: DTRs are 2+ and symmetrical in both lower extremities.  Strength is 5 out of 5 in both lower extremities.      ED Results / Procedures / Treatments   Labs (all labs ordered are listed, but only  abnormal results are displayed) Labs Reviewed - No data to display  EKG None  Radiology CT Renal Stone Study  Result Date: 05/30/2019 CLINICAL DATA:  Flank pain.  Foreign body. EXAM: CT ABDOMEN AND PELVIS WITHOUT CONTRAST TECHNIQUE: Multidetector CT imaging of the abdomen and pelvis was performed following the standard protocol without IV contrast. COMPARISON:  05/30/2019 at 5:20 p.m. FINDINGS: Lower chest: The lung bases are clear. The heart size is normal. Incidentally noted is bilateral gynecomastia. Hepatobiliary: The liver is normal. Normal gallbladder.There is no biliary ductal dilation. Pancreas: Normal contours without ductal dilatation. No peripancreatic fluid collection. Spleen: No splenic laceration or hematoma. Adrenals/Urinary Tract: --Adrenal glands: No adrenal hemorrhage. --Right kidney/ureter: No hydronephrosis or perinephric hematoma. --Left kidney/ureter: No hydronephrosis or perinephric hematoma. --Urinary bladder: Unremarkable. Stomach/Bowel: --Stomach/Duodenum: No hiatal hernia or other gastric abnormality. Normal duodenal course and caliber. --Small bowel: No dilatation or inflammation. --Colon: The previously noted foreign bodies now reside within the cecum. There is no evidence for a colonic obstruction. --Appendix: Normal. Vascular/Lymphatic: Normal course and caliber of the major abdominal vessels. --No retroperitoneal lymphadenopathy. --No mesenteric lymphadenopathy. --No pelvic or inguinal lymphadenopathy. Reproductive: Unremarkable Other: No ascites or free air. There is a small fat containing umbilical hernia. Musculoskeletal. No acute displaced fractures. IMPRESSION: The previously noted foreign bodies now reside in the cecum. Otherwise, no significant short interval change. Electronically Signed   By: Katherine Mantlehristopher  Green M.D.   On: 05/30/2019 21:32   CT Renal Stone Study  Result Date: 05/30/2019 CLINICAL DATA:  Flank pain, kidney stone suspected along the right flank.  EXAM: CT ABDOMEN AND PELVIS WITHOUT CONTRAST TECHNIQUE: Multidetector CT imaging of the abdomen and pelvis was performed following the standard protocol without IV contrast. COMPARISON:  CT abdomen and pelvis of 08/02/2015 FINDINGS: Lower chest: Lung bases are clear. No signs of consolidation or evidence of pleural effusion. Hepatobiliary: Normal liver. No signs of biliary ductal distension. Pancreas: Pancreas is unremarkable. Spleen: Spleen is normal. Adrenals/Urinary Tract: Adrenal glands are unremarkable with smooth contours of the bilateral kidneys, no signs of perinephric stranding, nephro or ureterolithiasis. Stomach/Bowel: Collection of radiopaque very dense material in the terminal ileum. These are geometric and atypical in appearance for ingested medications. Mild distention of the terminal ileum. The appendix is normal. There is no sign of small bowel obstruction. No perienteric stranding. Vascular/Lymphatic: Normal appearance of abdominal aorta based on noncontrast assessment. No adenopathy in the retroperitoneum or upper abdomen. No signs of pelvic lymphadenopathy. Reproductive: Normal appearance of the prostate. Other: Small umbilical hernia contains fat. Musculoskeletal: No signs of acute musculoskeletal process. IMPRESSION: 1. Collection of radiopaque very dense material  in the terminal ileum, which are geometric and atypical in appearance for ingested medications. Correlate with any potential ingestion of foreign bodies other than medications. Significance is uncertain but in the area of reported pain. Follow-up imaging with CT may be helpful based on clinical symptoms to ensure that this passes into the fecal stream in the colon. 2. Mild distention of the terminal ileum, without evidence of small bowel obstruction. 3. Small umbilical hernia contains fat. 4. These results were called by telephone at the time of interpretation on 05/30/2019 at 6:06 pm to provider [facility] , who verbally  acknowledged these results. Electronically Signed   By: Donzetta Kohut M.D.   On: 05/30/2019 18:07    Procedures Procedures (including critical care time)  Medications Ordered in ED Medications  ketorolac (TORADOL) injection 60 mg (has no administration in time range)  HYDROcodone-acetaminophen (NORCO/VICODIN) 5-325 MG per tablet 2 tablet (has no administration in time range)    ED Course  I have reviewed the triage vital signs and the nursing notes.  Pertinent labs & imaging results that were available during my care of the patient were reviewed by me and considered in my medical decision making (see chart for details).  Patient presenting with complaints of right flank pain as described in the HPI.  It seems very musculoskeletal in nature.  He had 2 CT scans yesterday that were both unremarkable and I do not feel as though any more imaging is indicated.  Patient strength is 5 out of 5 in both lower extremities and DTRs are 2+ and symmetrical in the patellar and Achilles tendons.  He is having no bowel or bladder complaints.  Patient will be discharged with musculoskeletal flank pain.  He will be treated with a steroid burst and pain medicine.  He is to follow-up as needed if not improving.  MDM Rules/Calculators/A&P  Final Clinical Impression(s) / ED Diagnoses Final diagnoses:  None    Rx / DC Orders ED Discharge Orders    None       Geoffery Lyons, MD 05/31/19 2215

## 2019-10-25 ENCOUNTER — Other Ambulatory Visit: Payer: Self-pay

## 2019-10-25 DIAGNOSIS — I10 Essential (primary) hypertension: Secondary | ICD-10-CM | POA: Insufficient documentation

## 2019-10-25 DIAGNOSIS — R1110 Vomiting, unspecified: Secondary | ICD-10-CM | POA: Insufficient documentation

## 2019-10-25 DIAGNOSIS — R109 Unspecified abdominal pain: Secondary | ICD-10-CM | POA: Insufficient documentation

## 2019-10-26 ENCOUNTER — Emergency Department (HOSPITAL_COMMUNITY)
Admission: EM | Admit: 2019-10-26 | Discharge: 2019-10-26 | Disposition: A | Discharge status: Home / Self Care | Payer: Self-pay | Attending: Emergency Medicine | Admitting: Emergency Medicine

## 2019-10-26 ENCOUNTER — Other Ambulatory Visit: Payer: Self-pay

## 2019-10-26 ENCOUNTER — Encounter (HOSPITAL_COMMUNITY): Payer: Self-pay | Admitting: Emergency Medicine

## 2019-10-26 DIAGNOSIS — R109 Unspecified abdominal pain: Secondary | ICD-10-CM

## 2019-10-26 LAB — URINALYSIS, ROUTINE W REFLEX MICROSCOPIC
Bacteria, UA: NONE SEEN
Bilirubin Urine: NEGATIVE
Glucose, UA: NEGATIVE mg/dL
Ketones, ur: NEGATIVE mg/dL
Leukocytes,Ua: NEGATIVE
Nitrite: NEGATIVE
Protein, ur: NEGATIVE mg/dL
Specific Gravity, Urine: 1.027 (ref 1.005–1.030)
pH: 5 (ref 5.0–8.0)

## 2019-10-26 MED ORDER — IBUPROFEN 400 MG PO TABS
400.0000 mg | ORAL_TABLET | Freq: Once | ORAL | Status: AC
  Administered 2019-10-26: 400 mg via ORAL
  Filled 2019-10-26: qty 1

## 2019-10-26 NOTE — Discharge Instructions (Signed)

## 2019-10-26 NOTE — ED Provider Notes (Signed)
[facility] EMERGENCY DEPARTMENT Provider Note   CSN: [id number] Arrival date & time: 10/25/19  2344     History Chief Complaint  Patient presents with  . right side pain    Matthew Alvarez is a 29 y.o. male.  The history is provided by the patient.  Flank Pain This is a new problem. The current episode started more than 2 days ago. The problem occurs daily. The problem has not changed since onset.Pertinent negatives include no chest pain, no abdominal pain and no shortness of breath. Exacerbated by: Movement. The symptoms are relieved by rest. He has tried acetaminophen for the symptoms. The treatment provided no relief.  Patient with history of GERD and hypertension presents with right flank pain.  Patient reports that approximately 1 week ago he drank too much alcohol and had significant episodes of vomiting. Since that time he has had pain along his right flank.  No trauma or falls.  It is exacerbated at work with movement and lifting.  No chest pain/shortness of breath.  No pleuritic pain.  No fevers or vomiting since that time.  No change in bowel movements.  No focal weakness.     Past Medical History:  Diagnosis Date  . GERD (gastroesophageal reflux disease)   . Hypertension     Patient Active Problem List   Diagnosis Date Noted  . CLOSED FRACTURE OF NECK OF RADIUS 07/09/2010    Past Surgical History:  Procedure Laterality Date  . TONSILLECTOMY AND ADENOIDECTOMY         History reviewed. No pertinent family history.  Social History   Tobacco Use  . Smoking status: Never Smoker  . Smokeless tobacco: Never Used  Substance Use Topics  . Alcohol use: Yes    Comment: occ. use  . Drug use: No    Home Medications Prior to Admission medications   Medication Sig Start Date End Date Taking? Authorizing Provider  acetaminophen (TYLENOL) 500 MG tablet Take 500 mg by mouth every 6 (six) hours as needed for mild pain or moderate pain.    [provider]    lidocaine (LIDODERM) 5 % Place 1 patch onto the skin daily. Remove & Discard patch within 12 hours or as directed by MD 05/30/19   Henderly, Britni A, PA-C  omeprazole (PRILOSEC) 20 MG capsule Take 20 mg by mouth daily as needed (for acid reflux).    [provider]    Allergies    Penicillins  Review of Systems   Review of Systems  Constitutional: Negative for fever.  Respiratory: Negative for shortness of breath.   Cardiovascular: Negative for chest pain.  Gastrointestinal: Negative for abdominal pain.  Genitourinary: Positive for flank pain. Negative for dysuria.  Musculoskeletal: Negative for back pain.  Neurological: Negative for weakness.  All other systems reviewed and are negative.   Physical Exam Updated Vital Signs BP 136/74 (BP Location: Right Arm)   Pulse 68   Temp 97.9 F (36.6 C) (Oral)   Resp 18   Ht 1.753 m (5\' 9" )   Wt 109.8 kg   SpO2 97%   BMI 35.74 kg/m   Physical Exam CONSTITUTIONAL: Well developed/well nourished HEAD: Normocephalic/atraumatic EYES: EOMI/PERRL ENMT: Mucous membranes moist NECK: supple no meningeal signs SPINE/BACK:entire spine nontender CV: S1/S2 noted, no murmurs/rubs/gallops noted LUNGS: Lungs are clear to auscultation bilaterally, no apparent distress Chest-there is no rib tenderness or deformities noted ABDOMEN: soft, nontender, no rebound or guarding, bowel sounds noted throughout abdomen Patient has mild tenderness to  palpation along the right flank.  No erythema, no bruising.  No swelling.  No induration.  No fluctuance. GU:no cva tenderness NEURO: Pt is awake/alert/appropriate, moves all extremitiesx4.  No facial droop.  No focal weakness noted.  Patient walks without difficulty EXTREMITIES: pulses normal/equal, full ROM SKIN: warm, color normal PSYCH: no abnormalities of mood noted, alert and oriented to situation  ED Results / Procedures / Treatments   Labs (all labs ordered are listed, but only abnormal  results are displayed) Labs Reviewed  URINALYSIS, ROUTINE W REFLEX MICROSCOPIC - Abnormal; Notable for the following components:      Result Value   Hgb urine dipstick SMALL (*)    All other components within normal limits    EKG None  Radiology No results found.  Procedures Procedures   Medications Ordered in ED Medications  ibuprofen (ADVIL) tablet 400 mg (400 mg Oral Given 10/26/19 0030)    ED Course  I have reviewed the triage vital signs and the nursing notes.  Pertinent labs   results that were available during my care of the patient were reviewed by me and considered in my medical decision making (see chart for details).    MDM Rules/Calculators/A&P                      Patient presented with right flank pain after excessive vomiting a week ago.  The pain is remained unchanged.  He was at work tonight had the pain while lifting, therefore he wanted to get checked out.  He has no other associated symptoms.  He is well-appearing. No other focal abdominal tenderness.  Vitals appropriate.  Urine is essentially unremarkable. I have very low suspicion for acute abdominal or urologic emergency. He will be discharged home, will limit heavy lifting and utilize OTC medications. We discussed strict return precautions Final Clinical Impression(s) / ED Diagnoses Final diagnoses:  None    Rx / DC Orders ED Discharge Orders    None       Ripley Fraise, MD 10/26/19 [phone]

## 2019-10-26 NOTE — ED Triage Notes (Signed)
Patient states right sided pain that started about 1 week ago. Patient unsure if any injury. Patient does have some swelling to his right side area.

## 2019-10-27 ENCOUNTER — Encounter (HOSPITAL_COMMUNITY): Payer: Self-pay | Admitting: Emergency Medicine

## 2019-10-27 ENCOUNTER — Other Ambulatory Visit: Payer: Self-pay

## 2019-10-27 ENCOUNTER — Emergency Department (HOSPITAL_COMMUNITY)
Admission: EM | Admit: 2019-10-27 | Discharge: 2019-10-27 | Disposition: A | Discharge status: Home / Self Care | Payer: Self-pay | Attending: Emergency Medicine | Admitting: Emergency Medicine

## 2019-10-27 DIAGNOSIS — S39012D Strain of muscle, fascia and tendon of lower back, subsequent encounter: Secondary | ICD-10-CM | POA: Insufficient documentation

## 2019-10-27 DIAGNOSIS — X500XXD Overexertion from strenuous movement or load, subsequent encounter: Secondary | ICD-10-CM | POA: Insufficient documentation

## 2019-10-27 DIAGNOSIS — S39012A Strain of muscle, fascia and tendon of lower back, initial encounter: Secondary | ICD-10-CM

## 2019-10-27 DIAGNOSIS — I10 Essential (primary) hypertension: Secondary | ICD-10-CM | POA: Insufficient documentation

## 2019-10-27 DIAGNOSIS — Z79899 Other long term (current) drug therapy: Secondary | ICD-10-CM | POA: Insufficient documentation

## 2019-10-27 MED ORDER — CELECOXIB 200 MG PO CAPS: 200.0000 mg | ORAL_CAPSULE | Freq: Two times a day (BID) | ORAL | 0 refills | Status: DC

## 2019-10-27 MED ORDER — CYCLOBENZAPRINE HCL 10 MG PO TABS: 5.0000 mg | ORAL_TABLET | Freq: Two times a day (BID) | ORAL | 0 refills | Status: DC | PRN

## 2019-10-27 MED ORDER — METHYLPREDNISOLONE 4 MG PO TBPK: ORAL_TABLET | ORAL | 0 refills | Status: DC
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
## 2019-10-27 NOTE — Discharge Instructions (Signed)
SEEK IMMEDIATE MEDICAL ATTENTION IF: New numbness, tingling, weakness, or problem with the use of your arms or legs.  Severe back pain not relieved with medications.  Change in bowel or bladder control.  Increasing pain in any areas of the body (such as chest or abdominal pain).  Shortness of breath, dizziness or fainting.  Nausea (feeling sick to your stomach), vomiting, fever, or sweats.  
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
## 2019-12-14 ENCOUNTER — Emergency Department (HOSPITAL_COMMUNITY)
Admission: EM | Admit: 2019-12-14 | Discharge: 2019-12-14 | Disposition: A | Discharge status: Home / Self Care | Payer: Self-pay | Attending: Emergency Medicine | Admitting: Emergency Medicine

## 2019-12-14 ENCOUNTER — Other Ambulatory Visit: Payer: Self-pay

## 2019-12-14 ENCOUNTER — Encounter (HOSPITAL_COMMUNITY): Payer: Self-pay

## 2019-12-14 DIAGNOSIS — K0889 Other specified disorders of teeth and supporting structures: Secondary | ICD-10-CM | POA: Insufficient documentation

## 2019-12-14 DIAGNOSIS — K0510 Chronic gingivitis, plaque induced: Secondary | ICD-10-CM | POA: Insufficient documentation

## 2019-12-14 DIAGNOSIS — I10 Essential (primary) hypertension: Secondary | ICD-10-CM | POA: Insufficient documentation

## 2019-12-14 DIAGNOSIS — Z880 Allergy status to penicillin: Secondary | ICD-10-CM | POA: Insufficient documentation

## 2019-12-14 DIAGNOSIS — Z79899 Other long term (current) drug therapy: Secondary | ICD-10-CM | POA: Insufficient documentation

## 2019-12-14 MED ORDER — CLINDAMYCIN HCL 150 MG PO CAPS: 150.0000 mg | ORAL_CAPSULE | Freq: Three times a day (TID) | ORAL | 0 refills | Status: DC

## 2019-12-14 MED ORDER — IBUPROFEN 400 MG PO TABS
600.0000 mg | ORAL_TABLET | Freq: Once | ORAL | Status: AC
  Administered 2019-12-14: 600 mg via ORAL
  Filled 2019-12-14: qty 2

## 2019-12-14 NOTE — Discharge Instructions (Signed)
Use ibuprofen every 6 hours and Tylenol every 4 hours as needed for pain. Take antibiotics as prescribed and follow-up with your dentist is changing an appointment.

## 2019-12-14 NOTE — ED Provider Notes (Signed)
[facility] EMERGENCY DEPARTMENT Provider Note   CSN: [id number] Arrival date & time: 12/14/19  3220     History Chief Complaint  Patient presents with  . Dental Pain    Matthew Alvarez is a 29 y.o. male.  Patient presents with worsening upper incisor tooth pain for the past 2 days.  Patient's had history of dental issues before and has dental appointment later next month.  No fevers chills or difficulty swallowing.  Patient has not tried anything for pain today.  High blood pressure history.        Past Medical History:  Diagnosis Date  . GERD (gastroesophageal reflux disease)   . Hypertension     Patient Active Problem List   Diagnosis Date Noted  . CLOSED FRACTURE OF NECK OF RADIUS 07/09/2010    Past Surgical History:  Procedure Laterality Date  . TONSILLECTOMY AND ADENOIDECTOMY         No family history on file.  Social History   Tobacco Use  . Smoking status: Never Smoker  . Smokeless tobacco: Never Used  Vaping Use  . Vaping Use: Never used  Substance Use Topics  . Alcohol use: Yes    Comment: occ. use  . Drug use: No    Home Medications Prior to Admission medications   Medication Sig Start Date End Date Taking? Authorizing Provider  acetaminophen (TYLENOL) 500 MG tablet Take 500 mg by mouth every 6 (six) hours as needed for mild pain or moderate pain.    [provider]  celecoxib (CELEBREX) 200 MG capsule Take 1 capsule (200 mg total) by mouth 2 (two) times daily. 10/27/19   Arthor Captain, PA-C  clindamycin (CLEOCIN) 150 MG capsule Take 1 capsule (150 mg total) by mouth 3 (three) times daily. 12/14/19   Blane Ohara, MD  cyclobenzaprine (FLEXERIL) 10 MG tablet Take 0.5-1 tablets (5-10 mg total) by mouth 2 (two) times daily as needed for muscle spasms. 10/27/19   Harris, Abigail, PA-C  lidocaine (LIDODERM) 5 % Place 1 patch onto the skin daily. Remove & Discard patch within 12 hours or as directed by MD 05/30/19   Henderly, Britni A, PA-C   methylPREDNISolone (MEDROL DOSEPAK) 4 MG TBPK tablet Use as directed 10/27/19   Arthor Captain, PA-C  omeprazole (PRILOSEC) 20 MG capsule Take 20 mg by mouth daily as needed (for acid reflux).    [provider]    Allergies    Penicillins  Review of Systems   Review of Systems  Constitutional: Negative for fever.  HENT: Negative for drooling.   Respiratory: Negative for shortness of breath.   Neurological: Negative for headaches.    Physical Exam Updated Vital Signs BP (!) 142/86 (BP Location: Right Arm)   Pulse 62   Temp 98.5 F (36.9 C) (Oral)   Resp 18   Ht 5\' 9"  (1.753 m)   Wt 109.8 kg   SpO2 97%   BMI 35.74 kg/m   Physical Exam Vitals and nursing note reviewed.  Constitutional:      Appearance: He is well-developed.  HENT:     Head: Normocephalic.     Comments: Patient has poor dentition in general, gingivitis and mild tenderness right upper incisor without fluctuance, no trismus, no facial swelling. Eyes:     General:        Right eye: No discharge.        Left eye: No discharge.     Conjunctiva/sclera: Conjunctivae normal.  Neck:     Trachea:  No tracheal deviation.  Cardiovascular:     Rate and Rhythm: Normal rate.  Pulmonary:     Effort: Pulmonary effort is normal.  Abdominal:     Tenderness: There is no guarding.  Musculoskeletal:     Cervical back: Normal range of motion and neck supple.  Skin:    General: Skin is warm.     Findings: No rash.  Neurological:     Mental Status: He is alert and oriented to person, place, and time.     ED Results / Procedures / Treatments   Labs (all labs ordered are listed, but only abnormal results are displayed) Labs Reviewed - No data to display  EKG None  Radiology No results found.  Procedures Procedures (including critical care time)  Medications Ordered in ED Medications  ibuprofen (ADVIL) tablet 600 mg (has no administration in time range)    ED Course  I have reviewed the triage  vital signs and the nursing notes.  Pertinent labs & imaging results that were available during my care of the patient were reviewed by me and considered in my medical decision making (see chart for details).    MDM Rules/Calculators/A&P                          Patient with gingivitis and dental pain concern for mild infection.  Discussed follow-up with dentist, antibiotics and ibuprofen. Final Clinical Impression(s) / ED Diagnoses Final diagnoses:  Pain, dental    Rx / DC Orders ED Discharge Orders         Ordered    clindamycin (CLEOCIN) 150 MG capsule  3 times daily     Discontinue  Reprint     12/14/19 0354           Blane Ohara, MD 12/14/19 [phone]

## 2019-12-14 NOTE — ED Triage Notes (Signed)
Pt c/o toothache for " a while" but worse today.

## 2020-01-05 ENCOUNTER — Encounter (HOSPITAL_COMMUNITY): Payer: Self-pay | Admitting: Emergency Medicine

## 2020-01-05 ENCOUNTER — Emergency Department (HOSPITAL_COMMUNITY)
Admission: EM | Admit: 2020-01-05 | Discharge: 2020-01-05 | Disposition: A | Discharge status: Home / Self Care | Payer: Self-pay | Attending: Emergency Medicine | Admitting: Emergency Medicine

## 2020-01-05 ENCOUNTER — Other Ambulatory Visit: Payer: Self-pay

## 2020-01-05 DIAGNOSIS — Z79899 Other long term (current) drug therapy: Secondary | ICD-10-CM | POA: Insufficient documentation

## 2020-01-05 DIAGNOSIS — I10 Essential (primary) hypertension: Secondary | ICD-10-CM | POA: Insufficient documentation

## 2020-01-05 DIAGNOSIS — Z202 Contact with and (suspected) exposure to infections with a predominantly sexual mode of transmission: Secondary | ICD-10-CM | POA: Insufficient documentation

## 2020-01-05 LAB — URINALYSIS, ROUTINE W REFLEX MICROSCOPIC
Bilirubin Urine: NEGATIVE
Glucose, UA: NEGATIVE mg/dL
Hgb urine dipstick: NEGATIVE
Ketones, ur: NEGATIVE mg/dL
Leukocytes,Ua: NEGATIVE
Nitrite: NEGATIVE
Protein, ur: NEGATIVE mg/dL
Specific Gravity, Urine: 1.025 (ref 1.005–1.030)
pH: 5 (ref 5.0–8.0)

## 2020-01-05 MED ORDER — DOXYCYCLINE HYCLATE 100 MG PO TABS
100.0000 mg | ORAL_TABLET | Freq: Once | ORAL | Status: AC
  Administered 2020-01-05: 100 mg via ORAL
  Filled 2020-01-05: qty 1

## 2020-01-05 MED ORDER — DOXYCYCLINE HYCLATE 100 MG PO CAPS: 100.0000 mg | ORAL_CAPSULE | Freq: Two times a day (BID) | ORAL | 0 refills | Status: AC

## 2020-01-05 NOTE — ED Triage Notes (Signed)
Pt reports his partner recently tested positive for chlamydia and wants to be tested

## 2020-01-05 NOTE — ED Provider Notes (Signed)
[facility] EMERGENCY DEPARTMENT Provider Note   CSN: [id number] Arrival date & time: 01/05/20  1227     History Chief Complaint  Patient presents with  . Exposure to STD    Matthew Alvarez is a 29 y.o. male history of GERD and hypertension.  Patient presents today because his girlfriend tested positive for chlamydia.  He reports that she was tested 3 days ago and just received her results.  She was negative for gonorrhea and all other STIs according to this patient.  He reports he has been completely asymptomatic he has not had any penile drainage, dysuria, testicular pain/swelling, rashes, fever/chills, abdominal pain, nausea/vomiting, joint pain or any additional concerns.  He reports he is in his normal state of health.  He would like treatment for chlamydia.  He is agreeable to testing for chlamydia/gonorrhea, he refuses testing for HIV/syphilis.  HPI     Past Medical History:  Diagnosis Date  . GERD (gastroesophageal reflux disease)   . Hypertension     Patient Active Problem List   Diagnosis Date Noted  . CLOSED FRACTURE OF NECK OF RADIUS 07/09/2010    Past Surgical History:  Procedure Laterality Date  . TONSILLECTOMY AND ADENOIDECTOMY         History reviewed. No pertinent family history.  Social History   Tobacco Use  . Smoking status: Never Smoker  . Smokeless tobacco: Never Used  Vaping Use  . Vaping Use: Never used  Substance Use Topics  . Alcohol use: Yes    Comment: occ. use  . Drug use: No    Home Medications Prior to Admission medications   Medication Sig Start Date End Date Taking? Authorizing Provider  acetaminophen (TYLENOL) 500 MG tablet Take 500 mg by mouth every 6 (six) hours as needed for mild pain or moderate pain.    [provider]  celecoxib (CELEBREX) 200 MG capsule Take 1 capsule (200 mg total) by mouth 2 (two) times daily. 10/27/19   Arthor Captain, PA-C  clindamycin (CLEOCIN) 150 MG capsule Take 1 capsule (150 mg  total) by mouth 3 (three) times daily. 12/14/19   Blane Ohara, MD  cyclobenzaprine (FLEXERIL) 10 MG tablet Take 0.5-1 tablets (5-10 mg total) by mouth 2 (two) times daily as needed for muscle spasms. 10/27/19   Arthor Captain, PA-C  doxycycline (VIBRAMYCIN) 100 MG capsule Take 1 capsule (100 mg total) by mouth 2 (two) times daily for 7 days. 01/05/20 01/12/20  Harlene Salts A, PA-C  lidocaine (LIDODERM) 5 % Place 1 patch onto the skin daily. Remove & Discard patch within 12 hours or as directed by MD 05/30/19   Henderly, Britni A, PA-C  methylPREDNISolone (MEDROL DOSEPAK) 4 MG TBPK tablet Use as directed 10/27/19   Arthor Captain, PA-C  omeprazole (PRILOSEC) 20 MG capsule Take 20 mg by mouth daily as needed (for acid reflux).    [provider]    Allergies    Penicillins  Review of Systems   Review of Systems  Constitutional: Negative.  Negative for chills and fever.  Gastrointestinal: Negative.  Negative for abdominal pain, nausea and vomiting.  Genitourinary: Negative.  Negative for discharge, dysuria, genital sores, hematuria, scrotal swelling and testicular pain.  Musculoskeletal: Negative.  Negative for arthralgias and myalgias.  Skin: Negative.  Negative for rash.    Physical Exam Updated Vital Signs BP (!) 131/85 (BP Location: Right Arm)   Pulse 57   Temp 98 F (36.7 C) (Oral)   Resp 16   Ht 5'  9" (1.753 m)   Wt (!) 109.8 kg   SpO2 99%   BMI 35.74 kg/m   Physical Exam Constitutional:      General: He is not in acute distress.    Appearance: Normal appearance. He is well-developed. He is not ill-appearing or diaphoretic.  HENT:     Head: Normocephalic and atraumatic.  Eyes:     General: Vision grossly intact. Gaze aligned appropriately.     Pupils: Pupils are equal, round, and reactive to light.  Neck:     Trachea: Trachea and phonation normal.  Pulmonary:     Effort: Pulmonary effort is normal. No respiratory distress.  Abdominal:     General: There  is no distension.     Palpations: Abdomen is soft.     Tenderness: There is no abdominal tenderness. There is no guarding or rebound.  Genitourinary:    Comments: Chaperone present during genital exam [employer].  No external genital lesions noted, no bumps on head of penis, specifically no vesicles concerning for herpes or chancre suggestive of syphilis.  No pain with palpation of the penis/glans, no discharge or urethritis noted.  Scrotum and testicles without erythema/swelling or tenderness to palpation. Cremasteric reflex intact bilaterally. No palpable hernia noted.  Musculoskeletal:        General: Normal range of motion.     Cervical back: Normal range of motion.  Skin:    General: Skin is warm and dry.  Neurological:     Mental Status: He is alert.     GCS: GCS eye subscore is 4. GCS verbal subscore is 5. GCS motor subscore is 6.     Comments: Speech is clear and goal oriented, follows commands Major Cranial nerves without deficit, no facial droop Moves extremities without ataxia, coordination intact  Psychiatric:        Behavior: Behavior normal.     ED Results / Procedures / Treatments   Labs (all labs ordered are listed, but only abnormal results are displayed) Labs Reviewed  URINALYSIS, ROUTINE W REFLEX MICROSCOPIC  GC/CHLAMYDIA PROBE AMP ([place]) NOT AT [facility]    EKG None  Radiology No results found.  Procedures Procedures (including critical care time)  Medications Ordered in ED Medications  doxycycline (VIBRA-TABS) tablet 100 mg (100 mg Oral Given 01/05/20 1515)    ED Course  I have reviewed the triage vital signs and the nursing notes.  Pertinent labs & imaging results that were available during my care of the patient were reviewed by me and considered in my medical decision making (see chart for details).    MDM Rules/Calculators/A&P                          Additional history obtained from: 1. Nursing notes from this  visit. --------------- 29 year-old male presents for exposure to chlamydia.  Physical examination is without abnormality, no signs/symptoms suggestive of epididymitis, orchitis, prostatitis or other acsending infections today. Additionally no evidence of rash or lesions.  He has no systemic infectious symptoms otherwise he is feeling well.   Urinalysis within normal limits.  No leukocytosis, hemoglobin or other abnormalities to suggest infection.  Given patient's known chlamydia exposure will treat with doxycycline 500 mg twice daily x7 days. In regard to gonococcal coverage, on chart review patient has listed allergy to penicillin with (swelling).  The patient reports he has not had any penicillin since he was a small child and does not recall the exact reaction  that he had a penicillin.  I had a shared decision-making discussion with the patient and I offered him an empiric dose of 500 mg IM Rocephin due to low suspicion for allergic reaction today however he refused.  Given that he reports that his girlfriend was negative for gonorrhea low suspicion for gonorrhea at this time.  I advised patient that he needs to follow-up on his GC chlamydia test on his MyChart account and if it is positive for gonorrhea he will need to seek treatment either here or the emergency department, an urgent care, his primary care doctor or the health department.  Additionally patient refused HIV and syphilis testing today, encourage patient to follow-up with his PCP in the health department for follow-up. Patient advised to inform and treat all sexual partners.  Advised safe sex practices.   At this time there does not appear to be any evidence of an acute emergency medical condition and the patient appears stable for discharge with appropriate outpatient follow up. Diagnosis was discussed with patient who verbalizes understanding of care plan and is agreeable to discharge. I have discussed return precautions with patient who  verbalizes understanding. Patient encouraged to follow-up with their PCP. All questions answered.   Note: Portions of this report may have been transcribed using voice recognition software. Every effort was made to ensure accuracy; however, inadvertent computerized transcription errors may still be present.  Final Clinical Impression(s) / ED Diagnoses Final diagnoses:  Exposure to chlamydia    Rx / DC Orders ED Discharge Orders         Ordered    doxycycline (VIBRAMYCIN) 100 MG capsule  2 times daily     Discontinue  Reprint     01/05/20 1505           Elizabeth Palau 01/05/20 1517    Geoffery Lyons, MD 01/06/20 [phone]

## 2020-01-05 NOTE — Discharge Instructions (Addendum)
At this time there does not appear to be the presence of an emergent medical condition, however there is always the potential for conditions to change. Please read and follow the below instructions.  Please return to the Emergency Department immediately for any new or worsening symptoms. Please be sure to follow up with your Primary Care Provider within one week regarding your visit today; please call their office to schedule an appointment even if you are feeling better for a follow-up visit. You are being treated presumptively today for chlamydia.  Please take the antibiotic doxycycline as prescribed, twice daily for the next 7 days until complete.  Your test for gonorrhea and chlamydia are in process and will result in the next 3 days.  If your gonorrhea test is positive you will need to seek treatment either at an urgent care, the emergency department or your primary care provider or the health department.  Please check your MyChart account for results. Please inform all sexual partners of positive results and that they should be tested and treated as well. Please wait 2 weeks and be sure that you and your partners are symptom free before returning to sexual activity. Please use protection with every sexual encounter.  Get help right away if: You have abnormal discharge. You develop flu-like symptoms, such as night sweats, sore throat, or muscle aches. You have fever/chills You have testicular pain or swelling You have any new/concerning or worsening of symptoms.  Please read the additional information packets attached to your discharge summary.  Do not take your medicine if  develop an itchy rash, swelling in your mouth or lips, or difficulty breathing; call 911 and seek immediate emergency medical attention if this occurs.  You may review your lab tests and imaging results in their entirety on your MyChart account.  Please discuss all results of fully with your primary care provider and other  specialist at your follow-up visit.  Note: Portions of this text may have been transcribed using voice recognition software. Every effort was made to ensure accuracy; however, inadvertent computerized transcription errors may still be present.

## 2020-01-22 ENCOUNTER — Other Ambulatory Visit: Payer: Self-pay

## 2020-01-22 ENCOUNTER — Encounter (HOSPITAL_COMMUNITY): Payer: Self-pay | Admitting: Emergency Medicine

## 2020-01-22 ENCOUNTER — Emergency Department (HOSPITAL_COMMUNITY)
Admission: EM | Admit: 2020-01-22 | Discharge: 2020-01-22 | Disposition: A | Discharge status: Home / Self Care | Payer: Self-pay | Attending: Emergency Medicine | Admitting: Emergency Medicine

## 2020-01-22 DIAGNOSIS — R0981 Nasal congestion: Secondary | ICD-10-CM | POA: Insufficient documentation

## 2020-01-22 DIAGNOSIS — R519 Headache, unspecified: Secondary | ICD-10-CM | POA: Insufficient documentation

## 2020-01-22 DIAGNOSIS — H53149 Visual discomfort, unspecified: Secondary | ICD-10-CM | POA: Insufficient documentation

## 2020-01-22 DIAGNOSIS — Z79899 Other long term (current) drug therapy: Secondary | ICD-10-CM | POA: Insufficient documentation

## 2020-01-22 DIAGNOSIS — I10 Essential (primary) hypertension: Secondary | ICD-10-CM | POA: Insufficient documentation

## 2020-01-22 MED ORDER — KETOROLAC TROMETHAMINE 30 MG/ML IJ SOLN
30.0000 mg | Freq: Once | INTRAMUSCULAR | Status: AC
  Administered 2020-01-22: 30 mg via INTRAVENOUS
  Filled 2020-01-22: qty 1

## 2020-01-22 MED ORDER — SODIUM CHLORIDE 0.9 % IV BOLUS
1000.0000 mL | Freq: Once | INTRAVENOUS | Status: AC
  Administered 2020-01-22: 1000 mL via INTRAVENOUS

## 2020-01-22 MED ORDER — METOCLOPRAMIDE HCL 5 MG/ML IJ SOLN
10.0000 mg | Freq: Once | INTRAMUSCULAR | Status: AC
  Administered 2020-01-22: 10 mg via INTRAVENOUS
  Filled 2020-01-22: qty 2

## 2020-01-22 NOTE — ED Triage Notes (Signed)
Pt reports headache since Thursday. Pt reports taking otc medication with minimal relief. Pt denies n/v,numbness/tingliing in extremities, changes in vision.

## 2020-01-22 NOTE — Discharge Instructions (Signed)
Please read instructions below. Stay hydrated. You can take 600 mg of Advil/ibuprofen every 6 hours as needed for headache. Schedule an appointment with your primary care provider to follow up on your headache and discuss preventative treatment. Return to the ER for severely worsening headache, vision changes, fever, weakness or numbness, or new or concerning symptoms.  

## 2020-01-22 NOTE — ED Provider Notes (Signed)
[facility] EMERGENCY DEPARTMENT Provider Note   CSN: [id number] Arrival date & time: 01/22/20  0840     History Chief Complaint  Patient presents with  . Headache    Matthew Alvarez is a 29 y.o. male w PMHx HTN, GERD, presenting to the ED with complaint of HA since Thursday. Patient reports frontal and posterior headache that feels like pressure. He has assoc nasal congestion/sinus pressure, some slight intermittent photophobia and decreased appetite. Denies fever, vision changes, neck pain, N/V, or other URI sx. He has had similar headache a few years ago, though no frequent HA. He has treated with cold compress and decongestant.  The history is provided by the patient.       Past Medical History:  Diagnosis Date  . GERD (gastroesophageal reflux disease)   . Hypertension     Patient Active Problem List   Diagnosis Date Noted  . CLOSED FRACTURE OF NECK OF RADIUS 07/09/2010    Past Surgical History:  Procedure Laterality Date  . TONSILLECTOMY AND ADENOIDECTOMY         History reviewed. No pertinent family history.  Social History   Tobacco Use  . Smoking status: Never Smoker  . Smokeless tobacco: Never Used  Vaping Use  . Vaping Use: Never used  Substance Use Topics  . Alcohol use: Yes    Comment: occ. use  . Drug use: No    Home Medications Prior to Admission medications   Medication Sig Start Date End Date Taking? Authorizing Provider  acetaminophen (TYLENOL) 500 MG tablet Take 500 mg by mouth every 6 (six) hours as needed for mild pain or moderate pain.   Yes [provider]  omeprazole (PRILOSEC) 20 MG capsule Take 20 mg by mouth daily as needed (for acid reflux).   Yes [provider]  Pseudoephedrine-DM-GG-APAP (COLD/COUGH MULTI-SYMPTOM PO) Take 1-2 tablets by mouth in the morning and at bedtime.   Yes [provider]  celecoxib (CELEBREX) 200 MG capsule Take 1 capsule (200 mg total) by mouth 2 (two) times daily. Patient not  taking: Reported on 01/22/2020 10/27/19   Arthor Captain, PA-C  clindamycin (CLEOCIN) 150 MG capsule Take 1 capsule (150 mg total) by mouth 3 (three) times daily. Patient not taking: Reported on 01/22/2020 12/14/19   Blane Ohara, MD  cyclobenzaprine (FLEXERIL) 10 MG tablet Take 0.5-1 tablets (5-10 mg total) by mouth 2 (two) times daily as needed for muscle spasms. Patient not taking: Reported on 01/22/2020 10/27/19   Arthor Captain, PA-C  lidocaine (LIDODERM) 5 % Place 1 patch onto the skin daily. Remove & Discard patch within 12 hours or as directed by MD Patient not taking: Reported on 01/22/2020 05/30/19   Henderly, Britni A, PA-C  methylPREDNISolone (MEDROL DOSEPAK) 4 MG TBPK tablet Use as directed Patient not taking: Reported on 01/22/2020 10/27/19   Arthor Captain, PA-C    Allergies    Penicillins  Review of Systems   Review of Systems  HENT: Positive for congestion.   Eyes: Positive for photophobia. Negative for visual disturbance.  Neurological: Positive for headaches.  All other systems reviewed and are negative.   Physical Exam Updated Vital Signs BP 119/76 (BP Location: Left Arm)   Pulse 66   Temp 98.7 F (37.1 C) (Oral)   Resp 12   Ht 5\' 9"  (1.753 m)   Wt 109 kg   SpO2 96%   BMI 35.49 kg/m   Physical Exam Vitals and nursing note reviewed.  Constitutional:  General: He is not in acute distress.    Appearance: He is well-developed.  HENT:     Head: Normocephalic and atraumatic.  Eyes:     Conjunctiva/sclera: Conjunctivae normal.  Cardiovascular:     Rate and Rhythm: Normal rate and regular rhythm.  Pulmonary:     Effort: Pulmonary effort is normal. No respiratory distress.     Breath sounds: Normal breath sounds.  Abdominal:     General: Bowel sounds are normal.     Palpations: Abdomen is soft.     Tenderness: There is no abdominal tenderness.  Musculoskeletal:     Cervical back: Normal range of motion and neck supple. No rigidity.  Skin:    General: Skin  is warm.  Neurological:     Mental Status: He is alert.     Comments: Mental Status:  Alert, oriented, thought content appropriate, able to give a coherent history. Speech fluent without evidence of aphasia. Able to follow 2 step commands without difficulty.  Cranial Nerves: PERRL, EOM nl, normal motor function of the face and sensation in all 3 distributions. Motor:  Normal tone. 5/5 strength in upper and lower extremities bilaterally including strong and equal grip strength and dorsiflexion/plantar flexion Sensory: grossly normal in all extremities.  Cerebellar: normal finger-to-nose with bilateral upper extremities Gait: normal gait and balance CV: distal pulses palpable throughout    Psychiatric:        Behavior: Behavior normal.     ED Results / Procedures / Treatments   Labs (all labs ordered are listed, but only abnormal results are displayed) Labs Reviewed - No data to display  EKG None  Radiology No results found.  Procedures Procedures (including critical care time)  Medications Ordered in ED Medications  sodium chloride 0.9 % bolus 1,000 mL (0 mLs Intravenous Stopped 01/22/20 1428)  ketorolac (TORADOL) 30 MG/ML injection 30 mg (30 mg Intravenous Given 01/22/20 1126)  metoCLOPramide (REGLAN) injection 10 mg (10 mg Intravenous Given 01/22/20 1126)    ED Course  I have reviewed the triage vital signs and the nursing notes.  Pertinent labs & imaging results that were available during my care of the patient were reviewed by me and considered in my medical decision making (see chart for details).    MDM Rules/Calculators/A&P                          Pt HA treated and improved while in ED.  Presentation is like pts typical HA and non concerning for [facility], ICH, Meningitis, or temporal arteritis. Pt is afebrile with no focal neuro deficits, nuchal rigidity, or change in vision. Pt is to follow up with PCP to discuss prophylactic medication. Pt verbalizes understanding and is  agreeable with plan to dc.   Final Clinical Impression(s) / ED Diagnoses Final diagnoses:  Acute nonintractable headache, unspecified headache type    Rx / DC Orders ED Discharge Orders    None       Arine Foley, [place] N, PA-C 01/22/20 1456    Bethann Berkshire, MD 01/24/20 1021

## 2020-02-02 ENCOUNTER — Emergency Department (HOSPITAL_COMMUNITY)
Admission: EM | Admit: 2020-02-02 | Discharge: 2020-02-02 | Disposition: A | Discharge status: Home / Self Care | Payer: Self-pay | Attending: Emergency Medicine | Admitting: Emergency Medicine

## 2020-02-02 ENCOUNTER — Encounter (HOSPITAL_COMMUNITY): Payer: Self-pay

## 2020-02-02 ENCOUNTER — Other Ambulatory Visit: Payer: Self-pay

## 2020-02-02 DIAGNOSIS — I10 Essential (primary) hypertension: Secondary | ICD-10-CM | POA: Insufficient documentation

## 2020-02-02 DIAGNOSIS — M542 Cervicalgia: Secondary | ICD-10-CM | POA: Insufficient documentation

## 2020-02-02 DIAGNOSIS — M25512 Pain in left shoulder: Secondary | ICD-10-CM | POA: Insufficient documentation

## 2020-02-02 DIAGNOSIS — M79602 Pain in left arm: Secondary | ICD-10-CM | POA: Insufficient documentation

## 2020-02-02 MED ORDER — PREDNISONE 20 MG PO TABS: 40.0000 mg | ORAL_TABLET | Freq: Every day | ORAL | 0 refills | Status: AC

## 2020-02-02 NOTE — ED Provider Notes (Signed)
[facility]-EM[facility]GENCY DEPT [facility] Emergency Department Provider Note MRN:  [id number]  Arrival date & time: 02/02/20     Chief Complaint   Arm Pain   History of Present Illness   Matthew Alvarez is a 29 y.o. year-old male with a history of hypertension presenting to the ED with chief complaint of arm pain.  Left arm, shoulder, neck pain for the past 10 days, worse with motion or palpation.  Denies any trauma.  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  Denies chest pain or shortness of breath, no abdominal pain, no fever, no other complaints.  Has been try muscle relaxers but not helping.  Review of Systems  A complete 10 system review of systems was obtained and all systems are negative except as noted in the HPI and PMH.   Patient's Health History    Past Medical History:  Diagnosis Date  . GERD (gastroesophageal reflux disease)   . Hypertension     Past Surgical History:  Procedure Laterality Date  . TONSILLECTOMY AND ADENOIDECTOMY      No family history on file.  Social History   Socioeconomic History  . Marital status: Divorced    Spouse name: Not on file  . Number of children: Not on file  . Years of education: Not on file  . Highest education level: Not on file  Occupational History  . Not on file  Tobacco Use  . Smoking status: Never Smoker  . Smokeless tobacco: Never Used  Vaping Use  . Vaping Use: Never used  Substance and Sexual Activity  . Alcohol use: Yes    Comment: occ. use  . Drug use: No  . Sexual activity: Not on file  Other Topics Concern  . Not on file  Social History Narrative  . Not on file   Social Determinants of Health   Financial Resource Strain:   . Difficulty of Paying Living Expenses: Not on file  Food Insecurity:   . Worried About Programme researcher, broadcasting/film/video in the Last Year: Not on file  . Ran Out of Food in the Last Year: Not on file  Transportation Needs:   . Lack of Transportation  (Medical): Not on file  . Lack of Transportation (Non-Medical): Not on file  Physical Activity:   . Days of Exercise per Week: Not on file  . Minutes of Exercise per Session: Not on file  Stress:   . Feeling of Stress : Not on file  Social Connections:   . Frequency of Communication with Friends and Family: Not on file  . Frequency of Social Gatherings with Friends and Family: Not on file  . Attends Religious Services: Not on file  . Active Member of Clubs or Organizations: Not on file  . Attends Banker Meetings: Not on file  . Marital Status: Not on file  Intimate Partner Violence:   . Fear of Current or Ex-Partner: Not on file  . Emotionally Abused: Not on file  . Physically Abused: Not on file  . Sexually Abused: Not on file     Physical Exam   Vitals:   02/02/20 0816  BP: 125/80  Pulse: (!) 55  Resp: 16  Temp: 98 F (36.7 C)  SpO2: 99%    CONSTITUTIONAL: Well-appearing, NAD NEURO:  Alert and oriented x 3, no focal deficits EYES:  eyes equal and reactive ENT/NECK:  no LAD, no JVD CARDIO: Regular rate, well-perfused, normal  S1 and S2 PULM:  CTAB no wheezing or rhonchi GI/GU:  normal bowel sounds, non-distended, non-tender MSK/SPINE:  No gross deformities, no edema, pain elicited with empty beer can test, negative Spurling, neurovascular intact distally, normal range of motion SKIN:  no rash, atraumatic PSYCH:  Appropriate speech and behavior  *Additional and/or pertinent findings included in MDM below  Diagnostic and Interventional Summary    EKG Interpretation  Date/Time:    Ventricular Rate:    PR Interval:    QRS Duration:   QT Interval:    QTC Calculation:   R Axis:     Text Interpretation:        Labs Reviewed - No data to display  No orders to display    Medications - No data to display   Procedures  /  Critical Care Procedures  ED Course and Medical Decision Making  I have reviewed the triage vital signs, the nursing notes,  and pertinent available records from the EMR.  Listed above are laboratory and imaging tests that I personally ordered, reviewed, and interpreted and then considered in my medical decision making (see below for details).  Suspect overuse injury, tendinitis, patient at times has radiation of the pain down the arm with some movements of the neck and so cervical radiculopathy is also possible.  No bowel or bladder dysfunction, no numbness or weakness, nothing to suggest myelopathy, no indication for imaging or further testing, appropriate for trial of prednisone and rest.       Elmer Sow. Pilar Plate, MD [facility] Emergency Medicine [facility] [email or web address]  Final Clinical Impressions(s) / ED Diagnoses     ICD-10-CM   1. Left arm pain  M79.602     ED Discharge Orders         Ordered    predniSONE (DELTASONE) 20 MG tablet  Daily        02/02/20 0836           Discharge Instructions Discussed with and Provided to Patient:     Discharge Instructions     You were evaluated in the Emergency Department and after careful evaluation, we did not find any emergent condition requiring admission or further testing in the hospital.  Your exam/testing today is overall reassuring.  Your symptoms seem to be due to either inflammation of the tendon in your arm or a pinched nerve in your neck.  We recommend rest of the arm and taking the prednisone anti-inflammatory medication as directed.  Please return to the Emergency Department if you experience any worsening of your condition.   Thank you for allowing [place] to be a part of your care.      Sabas Sous, MD 02/02/20 [phone]

## 2020-02-02 NOTE — Discharge Instructions (Addendum)
You were evaluated in the Emergency Department and after careful evaluation, we did not find any emergent condition requiring admission or further testing in the hospital.  Your exam/testing today is overall reassuring.  Your symptoms seem to be due to either inflammation of the tendon in your arm or a pinched nerve in your neck.  We recommend rest of the arm and taking the prednisone anti-inflammatory medication as directed.  Please return to the Emergency Department if you experience any worsening of your condition.   Thank you for allowing [place] to be a part of your care.

## 2020-02-02 NOTE — ED Triage Notes (Addendum)
Pt is having left arm pain. It  Is hard to lift his arm. Started on August 8th. He denies injuring it. NAD. Pt has taken Robaxin for this. Took it yesterday. No help.

## 2020-02-29 ENCOUNTER — Other Ambulatory Visit: Payer: Self-pay

## 2020-02-29 DIAGNOSIS — Z20822 Contact with and (suspected) exposure to covid-19: Secondary | ICD-10-CM

## 2020-03-03 LAB — NOVEL CORONAVIRUS, NAA: SARS-CoV-2, NAA: NOT DETECTED

## 2020-05-17 ENCOUNTER — Emergency Department (HOSPITAL_COMMUNITY)
Admission: EM | Admit: 2020-05-17 | Discharge: 2020-05-17 | Disposition: A | Discharge status: Home / Self Care | Payer: Self-pay | Attending: Emergency Medicine | Admitting: Emergency Medicine

## 2020-05-17 ENCOUNTER — Other Ambulatory Visit: Payer: Self-pay

## 2020-05-17 ENCOUNTER — Encounter (HOSPITAL_COMMUNITY): Payer: Self-pay

## 2020-05-17 DIAGNOSIS — I10 Essential (primary) hypertension: Secondary | ICD-10-CM | POA: Insufficient documentation

## 2020-05-17 DIAGNOSIS — K0889 Other specified disorders of teeth and supporting structures: Secondary | ICD-10-CM | POA: Insufficient documentation

## 2020-05-17 MED ORDER — METRONIDAZOLE 500 MG PO TABS: 500.0000 mg | ORAL_TABLET | Freq: Two times a day (BID) | ORAL | 0 refills | Status: DC

## 2020-05-17 MED ORDER — CEPHALEXIN 500 MG PO CAPS: 500.0000 mg | ORAL_CAPSULE | Freq: Four times a day (QID) | ORAL | 0 refills | Status: AC

## 2020-05-17 NOTE — ED Triage Notes (Signed)
Pt reports right upper dental pain in back molar since yesterday

## 2020-05-17 NOTE — ED Notes (Signed)
Entered room and introduced self to patient. Pt appears to be resting in bed with no obvious signs of distress noted. Bed is locked in the lowest position, side rails x2, call bell within reach. Pt educated on call light use and hourly rounding, in agreement at this time. All questions and concerns voiced addressed at this time. Respirations are even and unlabored with equal chest rise and fall.   Pt provided an ice pack per request at this time.

## 2020-05-17 NOTE — ED Notes (Signed)
ED Provider at bedside. 

## 2020-05-17 NOTE — ED Provider Notes (Signed)
[facility] EMERGENCY DEPARTMENT Provider Note   CSN: [id number] Arrival date & time: 05/17/20  1116     History Chief Complaint  Patient presents with  . Dental Pain    Matthew Alvarez is a 29 y.o. male.  HPI   Patient with significant medical history of hypertension presents to the emergency department with chief complaint of right upper dental pain.  Patient states he has had issues with this tooth for the last month but starting yesterday he had severe pain.  He describes the pain as a sharp sensation localized just at that tooth, does not radiate, denies sensitivity to hot or cold, denies difficulty with eating, swelling of the throat or tongue.  He denies fevers, chills, trismus or torticollis.  He has had issues with this tooth in the past and was placed on clindamycin in June.  He was going to see a dentist but has been unable to obtain one.  He denies  alleviating factors at this time.  He is not immunocompromise.  Patient denies headaches, fevers, chills, shortness breath, chest pain, abdominal pain, nausea, vomiting, diarrhea, pedal edema.  Past Medical History:  Diagnosis Date  . GERD (gastroesophageal reflux disease)   . Hypertension     Patient Active Problem List   Diagnosis Date Noted  . CLOSED FRACTURE OF NECK OF RADIUS 07/09/2010    Past Surgical History:  Procedure Laterality Date  . TONSILLECTOMY AND ADENOIDECTOMY         No family history on file.  Social History   Tobacco Use  . Smoking status: Never Smoker  . Smokeless tobacco: Never Used  Vaping Use  . Vaping Use: Never used  Substance Use Topics  . Alcohol use: Yes    Comment: occ. use  . Drug use: No    Home Medications Prior to Admission medications   Medication Sig Start Date End Date Taking? Authorizing Provider  acetaminophen (TYLENOL) 500 MG tablet Take 500 mg by mouth every 6 (six) hours as needed for mild pain or moderate pain.    [provider]  celecoxib (CELEBREX)  200 MG capsule Take 1 capsule (200 mg total) by mouth 2 (two) times daily. Patient not taking: Reported on 01/22/2020 10/27/19   Arthor Captain, PA-C  cephALEXin (KEFLEX) 500 MG capsule Take 1 capsule (500 mg total) by mouth 4 (four) times daily for 7 days. 05/17/20 05/24/20  Carroll Sage, PA-C  clindamycin (CLEOCIN) 150 MG capsule Take 1 capsule (150 mg total) by mouth 3 (three) times daily. Patient not taking: Reported on 01/22/2020 12/14/19   Blane Ohara, MD  cyclobenzaprine (FLEXERIL) 10 MG tablet Take 0.5-1 tablets (5-10 mg total) by mouth 2 (two) times daily as needed for muscle spasms. Patient not taking: Reported on 01/22/2020 10/27/19   Arthor Captain, PA-C  lidocaine (LIDODERM) 5 % Place 1 patch onto the skin daily. Remove & Discard patch within 12 hours or as directed by MD Patient not taking: Reported on 01/22/2020 05/30/19   Henderly, Britni A, PA-C  methylPREDNISolone (MEDROL DOSEPAK) 4 MG TBPK tablet Use as directed Patient not taking: Reported on 01/22/2020 10/27/19   Arthor Captain, PA-C  metroNIDAZOLE (FLAGYL) 500 MG tablet Take 1 tablet (500 mg total) by mouth 2 (two) times daily. 05/17/20   Carroll Sage, PA-C  omeprazole (PRILOSEC) 20 MG capsule Take 20 mg by mouth daily as needed (for acid reflux).    [provider]  Pseudoephedrine-DM-GG-APAP (COLD/COUGH MULTI-SYMPTOM PO) Take 1-2 tablets by mouth in  the morning and at bedtime.    [provider]    Allergies    Clindamycin/lincomycin and Penicillins  Review of Systems   Review of Systems  Constitutional: Negative for chills and fever.  HENT: Positive for dental problem. Negative for congestion, drooling, ear pain, facial swelling and sore throat.   Respiratory: Negative for cough and shortness of breath.   Cardiovascular: Negative for chest pain.  Gastrointestinal: Negative for abdominal pain, constipation, diarrhea and vomiting.  Genitourinary: Negative for enuresis.  Musculoskeletal: Negative  for back pain.  Skin: Negative for rash.  Neurological: Negative for headaches.    Physical Exam Updated Vital Signs BP (!) 144/89 (BP Location: Right Arm)   Pulse (!) 56   Temp 98 F (36.7 C) (Oral)   Resp 18   Ht 5\' 9"  (1.753 m)   Wt 108.9 kg   SpO2 96%   BMI 35.44 kg/m   Physical Exam Vitals and nursing note reviewed.  Constitutional:      General: He is not in acute distress.    Appearance: He is not ill-appearing.  HENT:     Head: Normocephalic and atraumatic.     Nose: No congestion.     Mouth/Throat:     Mouth: Mucous membranes are moist.     Pharynx: Oropharynx is clear. No oropharyngeal exudate or posterior oropharyngeal erythema.     Comments: Oropharynx was visualized tongue and uvula are both midline, he had multiple cavities, no noted abscess, but there was inflammation surrounding his right upper incisor.  Gumline was palpated no fluctuance or induration palpated.  Eyes:     Extraocular Movements: Extraocular movements intact.     Conjunctiva/sclera: Conjunctivae normal.  Cardiovascular:     Rate and Rhythm: Normal rate and regular rhythm.     Pulses: Normal pulses.     Heart sounds: No murmur heard.  No friction rub. No gallop.   Pulmonary:     Effort: No respiratory distress.     Breath sounds: No wheezing, rhonchi or rales.  Musculoskeletal:     Cervical back: Normal range of motion.     Comments: Patient is moving all 4 extremities out difficulty.  Skin:    General: Skin is warm and dry.  Neurological:     Mental Status: He is alert.     Comments: Patient is having no difficulty with word finding.  Psychiatric:        Mood and Affect: Mood normal.     ED Results / Procedures / Treatments   Labs (all labs ordered are listed, but only abnormal results are displayed) Labs Reviewed - No data to display  EKG None  Radiology No results found.  Procedures Procedures (including critical care time)  Medications Ordered in ED Medications -  No data to display  ED Course  I have reviewed the triage vital signs and the nursing notes.  Pertinent labs & imaging results that were available during my care of the patient were reviewed by me and considered in my medical decision making (see chart for details).    MDM Rules/Calculators/A&P                          Patient presents with right-sided dental pain.  He was alert, does not appear in acute distress, vital signs reassuring.  Due to well-appearing patient, benign physical exam further lab work and imaging not warranted at this time.  After reviewing patient's chart , patient has  allergy to clindamycin and penicillins.  Will speak with pharmacy for further recommendations to treat dental cavity.  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  I have low suspicion for peritonsillar abscess, retropharyngeal abscess, or Ludwig angina as oropharynx was visualized tongue and uvula were both midline, there is no exudates, erythema or edema noted in the posterior pillars or on/ around tonsils.  Low suspicion for an abscess as gumline were palpated no fluctuance or induration felt.  Low suspicion for periorbital or orbital cellulitis as patient face had no erythematous, patient EOMs were intact, he had no pain with eye movement.  Suspect suspect patient suffering from dental cavity.  Will start him on antibiotics, educated him on good dental hygiene and follow-up with a dentist for further evaluation.  Vital signs have remained stable, no indication for hospital admission.Patient given at home care as well strict return precautions.  Patient verbalized that they understood agreed to said plan.   Final Clinical Impression(s) / ED Diagnoses Final diagnoses:  Pain, dental    Rx / DC Orders ED Discharge Orders         Ordered    cephALEXin (KEFLEX) 500  MG capsule  4 times daily        05/17/20 1215    metroNIDAZOLE (FLAGYL) 500 MG tablet  2 times daily        05/17/20 1229           Barnie Del 05/17/20 1244    Jacalyn Lefevre, MD 05/17/20 1255

## 2020-05-17 NOTE — Discharge Instructions (Addendum)
Seen here for dental pain.  Started you on antibiotics please take as prescribed.  One of the antibiotics called Flagyl you will be taking reacts poorly with alcohol do not drink while taking this medication.  Recommend over-the-counter pain medications like ibuprofen or Tylenol every 6 hours as needed please follow dosing the back of bottle.  Please brush her teeth twice a day and abstain from high sugary foods.  Including sodas, [employer], fruit juice, candy, processed foods.  Please follow-up with a dentist for further evaluation provide you with contact information's please call.  Come back to the emergency department if you develop chest pain, shortness of breath, severe abdominal pain, uncontrolled nausea, vomiting, diarrhea.

## 2020-08-13 ENCOUNTER — Encounter (HOSPITAL_COMMUNITY): Payer: Self-pay

## 2020-08-13 ENCOUNTER — Other Ambulatory Visit: Payer: Self-pay

## 2020-08-13 ENCOUNTER — Emergency Department (HOSPITAL_COMMUNITY)
Admission: EM | Admit: 2020-08-13 | Discharge: 2020-08-13 | Disposition: A | Discharge status: Home / Self Care | Payer: Self-pay | Attending: Emergency Medicine | Admitting: Emergency Medicine

## 2020-08-13 DIAGNOSIS — Z20822 Contact with and (suspected) exposure to covid-19: Secondary | ICD-10-CM | POA: Insufficient documentation

## 2020-08-13 DIAGNOSIS — I10 Essential (primary) hypertension: Secondary | ICD-10-CM | POA: Insufficient documentation

## 2020-08-13 DIAGNOSIS — B349 Viral infection, unspecified: Secondary | ICD-10-CM | POA: Insufficient documentation

## 2020-08-13 LAB — RESP PANEL BY RT-PCR (FLU A&B, COVID) ARPGX2
Influenza A by PCR: NEGATIVE
Influenza B by PCR: NEGATIVE
SARS Coronavirus 2 by RT PCR: NEGATIVE

## 2020-08-13 NOTE — ED Triage Notes (Signed)
Pt presents to ED with complaints of generalized body aches, chills, congestion, headaches since Friday.

## 2020-08-13 NOTE — Discharge Instructions (Signed)
You were seen in the emergency department today for your generalized weakness, fatigue, congestion, and body aches.  Your vital signs, physical exam are very reassuring.    You have been tested for COVID-19 and influenza.  You may follow-up with your test results in your MyChart app.  The results should come back by this time tomorrow.  You may take over-the-counter medication such as Tylenol, ibuprofen, Mucinex as needed for your symptoms.  If you test positive for Covid, you should not go to work until 1 week from today in order to protect her coworkers.  Return to the emergency department develop any chest pain, shortness of breath, palpitations, nausea or vomiting does not stop, or any other new severe symptoms.

## 2020-08-13 NOTE — ED Notes (Signed)
Entered room and introduced self to patient. Pt appears to be resting in bed, respirations are even and unlabored with equal chest rise and fall. Bed is locked in the lowest position, side rails x2, call bell within reach. Pt educated on call light use and hourly rounding, verbalized understanding and in agreement at this time. All questions and concerns voiced addressed. Refreshments offered and provided per patient request.  

## 2020-08-13 NOTE — ED Notes (Signed)
Pt up and ambulatory with continuous pulse oximetry per provider order.  This RN notes that patient's SpO2 maintains at 98% on room air, heart rate between 70-85 bpm. Pt denies chest pain, shortness of breath, dizziness and lightheadedness. Pt back to assigned room and in bed without assistance from staff.  Provider made aware of ambulation trial.

## 2020-08-13 NOTE — ED Provider Notes (Signed)
[facility] EMERGENCY DEPARTMENT Provider Note   CSN: [id number] Arrival date & time: 08/13/20  8250     History Chief Complaint  Patient presents with  . Generalized Body Aches    Matthew Alvarez is a 30 y.o. male who presents with 4 days of generalized body aches, fatigue, congestion, and intermittent mildly productive cough.  Patient is not vaccinated as COVID-19, denies any known sick contacts.  He denies any chest pain, shortness of breath, palpitations, abdominal pain, nausea, vomiting, diarrhea.  He states he to go home test this morning which was negative, however he says he does not trust it and wanted a more formal test for COVID-19.  He is not been taking any over-the-counter medications at home.  I have personally reviewed this patient's medical records.  He has history of hypertension but is not on any medications.  Additionally has history of GERD but is also not on any medications for this.  HPI     Past Medical History:  Diagnosis Date  . GERD (gastroesophageal reflux disease)   . Hypertension     Patient Active Problem List   Diagnosis Date Noted  . CLOSED FRACTURE OF NECK OF RADIUS 07/09/2010    Past Surgical History:  Procedure Laterality Date  . TONSILLECTOMY AND ADENOIDECTOMY         No family history on file.  Social History   Tobacco Use  . Smoking status: Never Smoker  . Smokeless tobacco: Never Used  Vaping Use  . Vaping Use: Never used  Substance Use Topics  . Alcohol use: Yes    Comment: occ. use  . Drug use: No    Home Medications Prior to Admission medications   Medication Sig Start Date End Date Taking? Authorizing Provider  acetaminophen (TYLENOL) 500 MG tablet Take 500 mg by mouth every 6 (six) hours as needed for mild pain or moderate pain.    [provider]  celecoxib (CELEBREX) 200 MG capsule Take 1 capsule (200 mg total) by mouth 2 (two) times daily. Patient not taking: Reported on 01/22/2020 10/27/19   Arthor Captain, PA-C  clindamycin (CLEOCIN) 150 MG capsule Take 1 capsule (150 mg total) by mouth 3 (three) times daily. Patient not taking: Reported on 01/22/2020 12/14/19   Blane Ohara, MD  cyclobenzaprine (FLEXERIL) 10 MG tablet Take 0.5-1 tablets (5-10 mg total) by mouth 2 (two) times daily as needed for muscle spasms. Patient not taking: Reported on 01/22/2020 10/27/19   Arthor Captain, PA-C  lidocaine (LIDODERM) 5 % Place 1 patch onto the skin daily. Remove & Discard patch within 12 hours or as directed by MD Patient not taking: Reported on 01/22/2020 05/30/19   Henderly, Britni A, PA-C  methylPREDNISolone (MEDROL DOSEPAK) 4 MG TBPK tablet Use as directed Patient not taking: Reported on 01/22/2020 10/27/19   Arthor Captain, PA-C  metroNIDAZOLE (FLAGYL) 500 MG tablet Take 1 tablet (500 mg total) by mouth 2 (two) times daily. 05/17/20   Carroll Sage, PA-C  omeprazole (PRILOSEC) 20 MG capsule Take 20 mg by mouth daily as needed (for acid reflux).    [provider]  Pseudoephedrine-DM-GG-APAP (COLD/COUGH MULTI-SYMPTOM PO) Take 1-2 tablets by mouth in the morning and at bedtime.    [provider]    Allergies    Clindamycin/lincomycin and Penicillins  Review of Systems   Review of Systems  Constitutional: Positive for activity change, appetite change, chills and fatigue. Negative for diaphoresis and fever.  HENT: Positive for congestion. Negative for  sore throat, trouble swallowing and voice change.   Eyes: Negative.   Respiratory: Positive for cough. Negative for chest tightness and shortness of breath.   Cardiovascular: Negative for chest pain, palpitations and leg swelling.  Gastrointestinal: Negative for abdominal pain, blood in stool, constipation, diarrhea, nausea and vomiting.  Genitourinary: Negative.   Musculoskeletal: Positive for myalgias.  Skin: Negative.   Neurological: Positive for headaches. Negative for dizziness, syncope, weakness and light-headedness.   Psychiatric/Behavioral: Negative.     Physical Exam Updated Vital Signs BP 136/85 (BP Location: Right Arm)   Pulse 60   Temp 98.2 F (36.8 C) (Oral)   Resp 16   Ht 5\' 9"  (1.753 m)   Wt 109.8 kg   SpO2 97%   BMI 35.74 kg/m   Physical Exam Vitals and nursing note reviewed.  Constitutional:      Appearance: He is not toxic-appearing.  HENT:     Head: Normocephalic and atraumatic.     Nose: Congestion and rhinorrhea present. Rhinorrhea is purulent.     Mouth/Throat:     Mouth: Mucous membranes are moist.     Tongue: No lesions.     Pharynx: Oropharynx is clear. Uvula midline. Posterior oropharyngeal erythema present. No oropharyngeal exudate or uvula swelling.     Tonsils: No tonsillar exudate.  Eyes:     General: Lids are normal. Vision grossly intact.        Right eye: No discharge.        Left eye: No discharge.     Extraocular Movements: Extraocular movements intact.     Conjunctiva/sclera: Conjunctivae normal.     Pupils: Pupils are equal, round, and reactive to light.  Neck:     Trachea: Trachea and phonation normal.  Cardiovascular:     Rate and Rhythm: Normal rate and regular rhythm.     Pulses: Normal pulses.          Radial pulses are 2+ on the right side and 2+ on the left side.       Posterior tibial pulses are 2+ on the right side and 2+ on the left side.     Heart sounds: Normal heart sounds. No murmur heard.   Pulmonary:     Effort: Pulmonary effort is normal. No tachypnea, accessory muscle usage or respiratory distress.     Breath sounds: Normal breath sounds. No decreased breath sounds, wheezing or rales.  Chest:     Chest wall: No deformity, swelling, tenderness or crepitus.  Abdominal:     General: Bowel sounds are normal. There is no distension.     Palpations: Abdomen is soft.     Tenderness: There is no abdominal tenderness. There is no right CVA tenderness, left CVA tenderness, guarding or rebound. Negative signs include Murphy's sign and  McBurney's sign.  Musculoskeletal:        General: No deformity.     Cervical back: Normal range of motion and neck supple. Tenderness present. No rigidity or crepitus. Muscular tenderness present. No pain with movement or spinous process tenderness.     Right lower leg: No edema.     Left lower leg: No edema.  Lymphadenopathy:     Cervical: No cervical adenopathy.  Skin:    General: Skin is warm and dry.     Capillary Refill: Capillary refill takes less than 2 seconds.  Neurological:     General: No focal deficit present.     Mental Status: He is alert and oriented to person, place, and time.  Mental status is at baseline.     Sensory: Sensation is intact.     Motor: Motor function is intact.     Gait: Gait is intact.  Psychiatric:        Mood and Affect: Mood normal.     ED Results / Procedures / Treatments   Labs (all labs ordered are listed, but only abnormal results are displayed) Labs Reviewed  SARS CORONAVIRUS 2 (TAT 6-24 HRS)  INFLUENZA PANEL BY PCR (TYPE A & B)    EKG None  Radiology No results found.  Procedures Procedures   Medications Ordered in ED Medications - No data to display  ED Course  I have reviewed the triage vital signs and the nursing notes.  Pertinent labs & imaging results that were available during my care of the patient were reviewed by me and considered in my medical decision making (see chart for details).    MDM Rules/Calculators/A&P                         30 year old male who presents with concern for 4 days of viral syndrome type symptoms.  He is not vaccinated against COVID-19.  Hypertensive on intake to 146/97.  Vital signs otherwise normal.  Patient is afebrile, is not tachycardic, and he is oxygen saturation is normal on room air.  Physical exam additionally is very reassuring.  Cardiopulmonary exam is normal, abdominal exam is benign.  He has mild congestion and posterior pharyngeal erythema without sign of oropharyngeal  abscess or Ludwig's angina.  Ambulatory oxygen saturation stayed 98% on room air. Will test for COVID -19 and influenza A/B.  Given reassuring physical exam and vital signs, do not feel any further work-up is warranted in the ED at this time.  Patient may follow-up on his test results in the MyChart app at home.  He may take OTC medications as needed for his symptoms.  Matthew Alvarez voiced understanding of his medical evaluation and treatment plan.  Each of his questions was answered to his expressed satisfaction.  Return precautions given.  Patient is well-appearing, stable, and appropriate for discharge at this time.  Matthew Alvarez was evaluated in Emergency Department on 08/13/2020 for the symptoms described in the history of present illness. He was evaluated in the context of the global COVID-19 pandemic, which necessitated consideration that the patient might be at risk for infection with the SARS-CoV-2 virus that causes COVID-19. Institutional protocols and algorithms that pertain to the evaluation of patients at risk for COVID-19 are in a state of rapid change based on information released by regulatory bodies including the CDC and federal and state organizations. These policies and algorithms were followed during the patient's care in the ED.  This chart was dictated using voice recognition software, Dragon. Despite the best efforts of this provider to proofread and correct errors, errors may still occur which can change documentation meaning.  Final Clinical Impression(s) / ED Diagnoses Final diagnoses:  Viral syndrome    Rx / DC Orders ED Discharge Orders    None       Paris Lore, PA-C 08/13/20 1308    Horton, Clabe Seal, DO 08/13/20 1933

## 2020-08-16 ENCOUNTER — Other Ambulatory Visit: Payer: Self-pay

## 2020-10-30 ENCOUNTER — Encounter (HOSPITAL_COMMUNITY): Payer: Self-pay | Admitting: Emergency Medicine

## 2020-10-30 ENCOUNTER — Other Ambulatory Visit: Payer: Self-pay

## 2020-10-30 ENCOUNTER — Emergency Department (HOSPITAL_COMMUNITY)
Admission: EM | Admit: 2020-10-30 | Discharge: 2020-10-30 | Disposition: A | Discharge status: Home / Self Care | Payer: Self-pay | Attending: Emergency Medicine | Admitting: Emergency Medicine

## 2020-10-30 DIAGNOSIS — K0889 Other specified disorders of teeth and supporting structures: Secondary | ICD-10-CM | POA: Insufficient documentation

## 2020-10-30 DIAGNOSIS — I10 Essential (primary) hypertension: Secondary | ICD-10-CM | POA: Insufficient documentation

## 2020-10-30 DIAGNOSIS — U071 COVID-19: Secondary | ICD-10-CM | POA: Insufficient documentation

## 2020-10-30 DIAGNOSIS — H9201 Otalgia, right ear: Secondary | ICD-10-CM | POA: Insufficient documentation

## 2020-10-30 LAB — BASIC METABOLIC PANEL
Anion gap: 9 (ref 5–15)
BUN: 9 mg/dL (ref 6–20)
CO2: 26 mmol/L (ref 22–32)
Calcium: 8.3 mg/dL — ABNORMAL LOW (ref 8.9–10.3)
Chloride: 99 mmol/L (ref 98–111)
Creatinine, Ser: 1.33 mg/dL — ABNORMAL HIGH (ref 0.61–1.24)
GFR, Estimated: >60 mL/min (ref >60)
Glucose, Bld: 96 mg/dL (ref 70–99)
Potassium: 3.3 mmol/L — ABNORMAL LOW (ref 3.5–5.1)
Sodium: 134 mmol/L — ABNORMAL LOW (ref 135–145)

## 2020-10-30 LAB — CBC WITH DIFFERENTIAL/PLATELET
Abs Immature Granulocytes: 0.01 10*3/uL (ref 0.00–0.07)
Basophils Absolute: 0 10*3/uL (ref 0.0–0.1)
Basophils Relative: 0 %
Eosinophils Absolute: 0 10*3/uL (ref 0.0–0.5)
Eosinophils Relative: 0 %
HCT: 42.7 % (ref 39.0–52.0)
Hemoglobin: 13.8 g/dL (ref 13.0–17.0)
Immature Granulocytes: 0 %
Lymphocytes Relative: 18 %
Lymphs Abs: 1.3 10*3/uL (ref 0.7–4.0)
MCH: 27.8 pg (ref 26.0–34.0)
MCHC: 32.3 g/dL (ref 30.0–36.0)
MCV: 85.9 fL (ref 80.0–100.0)
Monocytes Absolute: 1.2 10*3/uL — ABNORMAL HIGH (ref 0.1–1.0)
Monocytes Relative: 16 %
Neutro Abs: 4.9 10*3/uL (ref 1.7–7.7)
Neutrophils Relative %: 66 %
Platelets: 221 10*3/uL (ref 150–400)
RBC: 4.97 MIL/uL (ref 4.22–5.81)
RDW: 13.5 % (ref 11.5–15.5)
WBC: 7.4 10*3/uL (ref 4.0–10.5)
nRBC: 0 % (ref 0.0–0.2)

## 2020-10-30 LAB — RESP PANEL BY RT-PCR (FLU A&B, COVID) ARPGX2
Influenza A by PCR: NEGATIVE
Influenza B by PCR: NEGATIVE
SARS Coronavirus 2 by RT PCR: POSITIVE — AB

## 2020-10-30 MED ORDER — IBUPROFEN 800 MG PO TABS
800.0000 mg | ORAL_TABLET | Freq: Once | ORAL | Status: AC
  Administered 2020-10-30: 800 mg via ORAL
  Filled 2020-10-30: qty 1

## 2020-10-30 MED ORDER — DIPHENHYDRAMINE HCL 50 MG/ML IJ SOLN
12.5000 mg | Freq: Once | INTRAMUSCULAR | Status: AC
  Administered 2020-10-30: 12.5 mg via INTRAVENOUS
  Filled 2020-10-30: qty 1

## 2020-10-30 MED ORDER — PROCHLORPERAZINE EDISYLATE 10 MG/2ML IJ SOLN
10.0000 mg | Freq: Once | INTRAMUSCULAR | Status: AC
  Administered 2020-10-30: 10 mg via INTRAVENOUS
  Filled 2020-10-30: qty 2

## 2020-10-30 MED ORDER — SODIUM CHLORIDE 0.9 % IV BOLUS
1000.0000 mL | Freq: Once | INTRAVENOUS | Status: AC
  Administered 2020-10-30: 1000 mL via INTRAVENOUS

## 2020-10-30 NOTE — ED Notes (Signed)
Pt in bed, pt states that he is feeling better and ready to go home.

## 2020-10-30 NOTE — ED Provider Notes (Signed)
[facility] EMERGENCY DEPARTMENT Provider Note   CSN: [id number] Arrival date & time: 10/30/20  5852     History Chief Complaint  Patient presents with  . Headache    Matthew Alvarez is a 30 y.o. male with past medical history of HTN who presents the ED with a 2-day history of headache and generalized weakness.  He also is endorsing right-sided ear discomfort.  Patient was noted to be febrile at 100.4 F on arrival to the ED. he tells me that for the past couple of days she has been feeling generalized weakness and lightheaded when he stands.  He denies any room spinning dizziness.  He states that his headache is generalized, nonfocal.  He is not sure if he has had any sick contacts at work.  Denies any known or obvious fevers or chills at home.  He is simply here because he feels very weak at work and whenever he stands up too quickly he gets lightheaded.  He states that he has been eating okay and drinking mostly sodas.  Denies any personal or known family history of diabetes.  He also denies any recent injuries or trauma.  Patient does state that he has had some right-sided ear discomfort.  He questions whether or not his headache symptoms are from ongoing dental pain.  He understands he needs to see dentist.  He denies any blurred vision or diplopia, hearing deficit, congestion, cough, sore throat, abdominal pain, nausea or vomiting, chest pain or shortness of breath, back pain, urinary symptoms, or changes in his bowel habits.  He lives alone.  His baby's mother drove him here to the ED.  HPI     Past Medical History:  Diagnosis Date  . GERD (gastroesophageal reflux disease)   . Hypertension     Patient Active Problem List   Diagnosis Date Noted  . CLOSED FRACTURE OF NECK OF RADIUS 07/09/2010    Past Surgical History:  Procedure Laterality Date  . TONSILLECTOMY AND ADENOIDECTOMY         History reviewed. No pertinent family history.  Social History   Tobacco  Use  . Smoking status: Never Smoker  . Smokeless tobacco: Never Used  Vaping Use  . Vaping Use: Never used  Substance Use Topics  . Alcohol use: Yes    Comment: occ. use  . Drug use: No    Home Medications Prior to Admission medications   Medication Sig Start Date End Date Taking? Authorizing Provider  omeprazole (PRILOSEC) 20 MG capsule Take 20 mg by mouth daily as needed (for acid reflux).   Yes [provider]  acetaminophen (TYLENOL) 500 MG tablet Take 500 mg by mouth every 6 (six) hours as needed for mild pain or moderate pain.    [provider]  celecoxib (CELEBREX) 200 MG capsule Take 1 capsule (200 mg total) by mouth 2 (two) times daily. Patient not taking: No sig reported 10/27/19   Arthor Captain, PA-C  clindamycin (CLEOCIN) 150 MG capsule Take 1 capsule (150 mg total) by mouth 3 (three) times daily. Patient not taking: No sig reported 12/14/19   Blane Ohara, MD  cyclobenzaprine (FLEXERIL) 10 MG tablet Take 0.5-1 tablets (5-10 mg total) by mouth 2 (two) times daily as needed for muscle spasms. Patient not taking: No sig reported 10/27/19   Arthor Captain, PA-C  lidocaine (LIDODERM) 5 % Place 1 patch onto the skin daily. Remove & Discard patch within 12 hours or as directed by MD Patient not taking: No  sig reported 05/30/19   Henderly, Britni A, PA-C  methylPREDNISolone (MEDROL DOSEPAK) 4 MG TBPK tablet Use as directed Patient not taking: No sig reported 10/27/19   Arthor Captain, PA-C  metroNIDAZOLE (FLAGYL) 500 MG tablet Take 1 tablet (500 mg total) by mouth 2 (two) times daily. Patient not taking: Reported on 10/30/2020 05/17/20   Carroll Sage, PA-C    Allergies    Clindamycin/lincomycin and Penicillins  Review of Systems   Review of Systems  All other systems reviewed and are negative.   Physical Exam Updated Vital Signs BP (!) 142/70 (BP Location: Right Arm)   Pulse 94   Temp (!) 101.2 F (38.4 C) (Oral)   Resp 18   Ht 5\' 9"  (1.753  m)   Wt 107 kg   SpO2 97%   BMI 34.85 kg/m   Physical Exam Vitals and nursing note reviewed. Exam conducted with a chaperone present.  Constitutional:      Appearance: Normal appearance.  HENT:     Head: Normocephalic and atraumatic.     Right Ear: Tympanic membrane, ear canal and external ear normal.     Left Ear: Tympanic membrane, ear canal and external ear normal.     Ears:     Comments: Otologic exams are benign.  Mild canal irritation/erythema that he attributes to wearing earplugs at work.  TMs are visualized, nonbulging, nonerythematous. Eyes:     General: No scleral icterus.    Extraocular Movements: Extraocular movements intact.     Conjunctiva/sclera: Conjunctivae normal.     Pupils: Pupils are equal, round, and reactive to light.  Neck:     Comments: ROM fully intact.  No meningismus. Cardiovascular:     Rate and Rhythm: Normal rate.     Pulses: Normal pulses.  Pulmonary:     Effort: Pulmonary effort is normal. No respiratory distress.  Musculoskeletal:        General: Normal range of motion.     Cervical back: Normal range of motion and neck supple. No rigidity or tenderness.  Skin:    General: Skin is dry.  Neurological:     General: No focal deficit present.     Mental Status: He is alert and oriented to person, place, and time.     GCS: GCS eye subscore is 4. GCS verbal subscore is 5. GCS motor subscore is 6.     Cranial Nerves: No cranial nerve deficit.     Sensory: No sensory deficit.     Motor: No weakness.     Coordination: Coordination normal.     Gait: Gait normal.     Comments: CN II through XII grossly intact.  Peroneal intact.  No meningismus.  No sensory deficits.  Negative Romberg.  Ambulatory.  Psychiatric:        Mood and Affect: Mood normal.        Behavior: Behavior normal.        Thought Content: Thought content normal.     ED Results / Procedures / Treatments   Labs (all labs ordered are listed, but only abnormal results are  displayed) Labs Reviewed  RESP PANEL BY RT-PCR (FLU A&B, COVID) ARPGX2 - Abnormal; Notable for the following components:      Result Value   SARS Coronavirus 2 by RT PCR POSITIVE (*)    All other components within normal limits  CBC WITH DIFFERENTIAL/PLATELET - Abnormal; Notable for the following components:   Monocytes Absolute 1.2 (*)    All other components within  normal limits  BASIC METABOLIC PANEL - Abnormal; Notable for the following components:   Sodium 134 (*)    Potassium 3.3 (*)    Creatinine, Ser 1.33 (*)    Calcium 8.3 (*)    All other components within normal limits    EKG None  Radiology No results found.  Procedures Procedures   Medications Ordered in ED Medications  ibuprofen (ADVIL) tablet 800 mg (has no administration in time range)  sodium chloride 0.9 % bolus 1,000 mL (1,000 mLs Intravenous New Bag/Given 10/30/20 1208)  prochlorperazine (COMPAZINE) injection 10 mg (10 mg Intravenous Given 10/30/20 1211)  diphenhydrAMINE (BENADRYL) injection 12.5 mg (12.5 mg Intravenous Given 10/30/20 1209)    ED Course  I have reviewed the triage vital signs and the nursing notes.  Pertinent labs & imaging results that were available during my care of the patient were reviewed by me and considered in my medical decision making (see chart for details).  Clinical Course as of 10/30/20 1411  Tue Oct 30, 2020  1405 SARS Coronavirus 2 by RT PCR(!): POSITIVE [GG]    Clinical Course User Index [GG] Lorelee New, PA-C   MDM Rules/Calculators/A&P                          Matthew Alvarez was evaluated in Emergency Department on 10/30/2020 for the symptoms described in the history of present illness. He was evaluated in the context of the global COVID-19 pandemic, which necessitated consideration that the patient might be at risk for infection with the SARS-CoV-2 virus that causes COVID-19. Institutional protocols and algorithms that pertain to the evaluation of patients at  risk for COVID-19 are in a state of rapid change based on information released by regulatory bodies including the CDC and federal and state organizations. These policies and algorithms were followed during the patient's care in the ED.  I personally reviewed patient's medical chart and all notes from triage and staff during today's encounter. I have also ordered and reviewed all labs and imaging that I felt to be medically necessary in the evaluation of this patient's complaints and with consideration of their physical exam. If needed, translation services were available and utilized.   Patient with headache and generalized weakness.  He also endorses lightheadedness.  He has been drinking a lot of soda.  Perhaps an element of dehydration.  Given fever, also concern for viral infection.  We will obtain basic laboratory work-up while headache is treated with migraine cocktail.  He does have a history of migraines and states that this feels similar.  However, given fever of 100.4 F, we will also proceed with respiratory panel by PCR.  Will obtain orthostatic blood pressures given that he is lightheaded on standing.  Temperature increased to 101.2 F.  We will treat with ibuprofen 800 mg.  He has already been treated with Compazine, Benadryl, and 1 L IV NS for his headache symptoms, with history of migraines.  His laboratory work-up is largely unremarkable.  CBC without leukocytosis concerning for infection.  No anemia.  BMP with mild renal impairment with creatinine elevated 1.33 from baseline of ~1.  Given his lightheadedness with standing, suspect that is prerenal azotemia.   On subsequent evaluation, patient is feeling entirely improved.  He feels ready for discharge.  I informed him that he was COVID-19 positive.  He will maintain isolation precautions.  He can follow-up with his primary care provider for ongoing evaluation and management.  He denies any chest pain, shortness of breath, cough, or other  concerning symptoms.   ER return precautions discussed.  Patient voices understanding is agreeable to the plan.  Final Clinical Impression(s) / ED Diagnoses Final diagnoses:  COVID-19    Rx / DC Orders ED Discharge Orders         Ordered    Ambulatory referral for Covid Treatment        10/30/20 1411           Lorelee NewGreen, Stony Stegmann L, PA-C 10/30/20 1411    Terrilee FilesButler, Michael C, MD 10/30/20 1821

## 2020-10-30 NOTE — Discharge Instructions (Signed)
You tested positive for COVID-19.   Please maintain isolation precautions.  Check your temperature regularly and take Tylenol or ibuprofen as needed for fever control.  You are likely dehydrated.  Please increase your oral hydration and eat regular meals.  You are also mildly low on potassium.  Please replenish with potatoes and bananas.  Please follow-up with your primary care provider regarding today's ED encounter for ongoing evaluation and management.  Return to the ER seek immediate medical attention should you experience any new or worsening symptoms.

## 2020-10-30 NOTE — ED Triage Notes (Signed)
Pt reports headache, dizziness, generalized weakness, right ear pain x2 days. Pt reports history of migraines and states was taken off bp medication. 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 Pt denies numbness/tingling.

## 2020-10-31 ENCOUNTER — Telehealth: Payer: Self-pay

## 2020-10-31 NOTE — Telephone Encounter (Signed)
Called to discuss with patient about COVID-19 symptoms and the use of one of the available treatments for those with mild to moderate Covid symptoms and at a high risk of hospitalization.  Pt appears to qualify for outpatient treatment due to co-morbid conditions and/or a member of an at-risk group in accordance with the FDA Emergency Use Authorization.    Symptom onset: 10/28/20 Fever,headache, weakness Vaccinated: No Booster? No Immunocompromised? No Qualifiers: HTN NIH Criteria: Tier 2  Declines treatment.  Matthew Alvarez

## 2020-11-05 ENCOUNTER — Emergency Department (HOSPITAL_COMMUNITY): Admission: EM | Admit: 2020-11-05 | Discharge: 2020-11-05 | Discharge status: Absent without leave | Payer: Self-pay

## 2021-02-15 ENCOUNTER — Encounter (HOSPITAL_COMMUNITY): Payer: Self-pay | Admitting: *Deleted

## 2021-02-15 ENCOUNTER — Other Ambulatory Visit: Payer: Self-pay

## 2021-02-15 ENCOUNTER — Emergency Department (HOSPITAL_COMMUNITY)
Admission: EM | Admit: 2021-02-15 | Discharge: 2021-02-15 | Disposition: A | Discharge status: Home / Self Care | Payer: Self-pay | Attending: Emergency Medicine | Admitting: Emergency Medicine

## 2021-02-15 DIAGNOSIS — K0889 Other specified disorders of teeth and supporting structures: Secondary | ICD-10-CM | POA: Insufficient documentation

## 2021-02-15 DIAGNOSIS — I10 Essential (primary) hypertension: Secondary | ICD-10-CM | POA: Insufficient documentation

## 2021-02-15 MED ORDER — DOXYCYCLINE HYCLATE 100 MG PO CAPS: 100.0000 mg | ORAL_CAPSULE | Freq: Two times a day (BID) | ORAL | 0 refills | Status: AC

## 2021-02-15 NOTE — ED Provider Notes (Signed)
[facility] EMERGENCY DEPARTMENT Provider Note   CSN: [id number] Arrival date & time: 02/15/21  1031     History Chief Complaint  Patient presents with   Dental Pain    Matthew Alvarez is a 30 y.o. male.  Patient complaining of dental pain.  Complaining of right-sided lower jaw posterior molar pain ongoing for 2 days now describes sharp and aching nonradiating.  No associated fevers no cough no vomiting no diarrhea.  He is waiting for his insurance to kick in so he can see the dentist next month.  Denies any facial swelling.      Past Medical History:  Diagnosis Date   GERD (gastroesophageal reflux disease)    Hypertension     Patient Active Problem List   Diagnosis Date Noted   CLOSED FRACTURE OF NECK OF RADIUS 07/09/2010    Past Surgical History:  Procedure Laterality Date   TONSILLECTOMY AND ADENOIDECTOMY         No family history on file.  Social History   Tobacco Use   Smoking status: Never   Smokeless tobacco: Never  Vaping Use   Vaping Use: Never used  Substance Use Topics   Alcohol use: Yes    Comment: occ. use   Drug use: No    Home Medications Prior to Admission medications   Medication Sig Start Date End Date Taking? Authorizing Provider  doxycycline (VIBRAMYCIN) 100 MG capsule Take 1 capsule (100 mg total) by mouth 2 (two) times daily for 7 days. 02/15/21 02/22/21 Yes Cheryll Cockayne, MD  acetaminophen (TYLENOL) 500 MG tablet Take 500 mg by mouth every 6 (six) hours as needed for mild pain or moderate pain.    [provider]  celecoxib (CELEBREX) 200 MG capsule Take 1 capsule (200 mg total) by mouth 2 (two) times daily. Patient not taking: No sig reported 10/27/19   Arthor Captain, PA-C  cyclobenzaprine (FLEXERIL) 10 MG tablet Take 0.5-1 tablets (5-10 mg total) by mouth 2 (two) times daily as needed for muscle spasms. Patient not taking: No sig reported 10/27/19   Arthor Captain, PA-C  lidocaine (LIDODERM) 5 % Place 1 patch onto the skin  daily. Remove & Discard patch within 12 hours or as directed by MD Patient not taking: No sig reported 05/30/19   Henderly, Britni A, PA-C  methylPREDNISolone (MEDROL DOSEPAK) 4 MG TBPK tablet Use as directed Patient not taking: No sig reported 10/27/19   Arthor Captain, PA-C  omeprazole (PRILOSEC) 20 MG capsule Take 20 mg by mouth daily as needed (for acid reflux).    [provider]    Allergies    Clindamycin/lincomycin and Penicillins  Review of Systems   Review of Systems  Constitutional:  Negative for fever.  HENT:  Negative for ear pain and sore throat.   Eyes:  Negative for pain.  Respiratory:  Negative for cough.   Cardiovascular:  Negative for chest pain.  Gastrointestinal:  Negative for abdominal pain.  Genitourinary:  Negative for flank pain.  Musculoskeletal:  Negative for back pain.  Skin:  Negative for color change and rash.  Neurological:  Negative for syncope.  All other systems reviewed and are negative.  Physical Exam Updated Vital Signs BP 128/65   Pulse (!) 58   Temp 98.1 F (36.7 C)   Resp 20   SpO2 98%   Physical Exam Constitutional:      Appearance: He is well-developed.  HENT:     Head: Normocephalic.     Nose: Nose  normal.     Mouth/Throat:     Comments: No swelling of soft tissues inside the mouth.  No gum swelling no facial swelling noted.  Tenderness palpation the right posterior molar second from the rear lower jaw. Eyes:     Extraocular Movements: Extraocular movements intact.  Cardiovascular:     Rate and Rhythm: Normal rate.  Pulmonary:     Effort: Pulmonary effort is normal.  Skin:    Coloration: Skin is not jaundiced.  Neurological:     Mental Status: He is alert. Mental status is at baseline.    ED Results / Procedures / Treatments   Labs (all labs ordered are listed, but only abnormal results are displayed) Labs Reviewed - No data to display  EKG None  Radiology No results found.  Procedures Procedures    Medications Ordered in ED Medications - No data to display  ED Course  I have reviewed the triage vital signs and the nursing notes.  Pertinent labs & imaging results that were available during my care of the patient were reviewed by me and considered in my medical decision making (see chart for details).    MDM Rules/Calculators/A&P                           Patient presents with dental pain.  No signs of abscess or gum infection.  No fevers reported.  Given prescription of antibiotics, advised follow-up with dentistry within the week.  Advised immediate return for worsening symptoms fevers pain or any additional concerns.  Final Clinical Impression(s) / ED Diagnoses Final diagnoses:  Pain, dental    Rx / DC Orders ED Discharge Orders          Ordered    doxycycline (VIBRAMYCIN) 100 MG capsule  2 times daily        02/15/21 1119             Cheryll Cockayne, MD 02/15/21 1119

## 2021-02-15 NOTE — ED Triage Notes (Signed)
Pain in right lower jaw onset last night

## 2021-02-15 NOTE — Discharge Instructions (Addendum)
Call your dentist within the week.  Return immediately back to the ER if:  Your symptoms worsen within the next 12-24 hours. You develop new symptoms such as new fevers, persistent vomiting, new pain, shortness of breath, or new weakness or numbness, or if you have any other concerns.

## 2021-04-01 ENCOUNTER — Emergency Department (HOSPITAL_COMMUNITY): Payer: Self-pay

## 2021-04-01 ENCOUNTER — Emergency Department (HOSPITAL_COMMUNITY)
Admission: EM | Admit: 2021-04-01 | Discharge: 2021-04-01 | Disposition: A | Discharge status: Home / Self Care | Payer: Self-pay | Attending: Emergency Medicine | Admitting: Emergency Medicine

## 2021-04-01 ENCOUNTER — Other Ambulatory Visit: Payer: Self-pay

## 2021-04-01 ENCOUNTER — Encounter (HOSPITAL_COMMUNITY): Payer: Self-pay | Admitting: *Deleted

## 2021-04-01 DIAGNOSIS — X500XXA Overexertion from strenuous movement or load, initial encounter: Secondary | ICD-10-CM | POA: Insufficient documentation

## 2021-04-01 DIAGNOSIS — M25511 Pain in right shoulder: Secondary | ICD-10-CM | POA: Insufficient documentation

## 2021-04-01 DIAGNOSIS — Y990 Civilian activity done for income or pay: Secondary | ICD-10-CM | POA: Insufficient documentation

## 2021-04-01 DIAGNOSIS — S46911A Strain of unspecified muscle, fascia and tendon at shoulder and upper arm level, right arm, initial encounter: Secondary | ICD-10-CM

## 2021-04-01 DIAGNOSIS — I10 Essential (primary) hypertension: Secondary | ICD-10-CM | POA: Insufficient documentation

## 2021-04-01 MED ORDER — LIDOCAINE 5 % EX PTCH: 1.0000 | MEDICATED_PATCH | CUTANEOUS | 0 refills | Status: DC

## 2021-04-01 MED ORDER — NAPROXEN 500 MG PO TABS: 500.0000 mg | ORAL_TABLET | Freq: Two times a day (BID) | ORAL | 0 refills | Status: DC

## 2021-04-01 MED ORDER — KETOROLAC TROMETHAMINE 30 MG/ML IJ SOLN
30.0000 mg | Freq: Once | INTRAMUSCULAR | Status: AC
  Administered 2021-04-01: 30 mg via INTRAMUSCULAR
  Filled 2021-04-01: qty 1

## 2021-04-01 MED ORDER — LIDOCAINE 5 % EX PTCH
1.0000 | MEDICATED_PATCH | CUTANEOUS | Status: DC
  Administered 2021-04-01: 1 via TRANSDERMAL
  Filled 2021-04-01: qty 1

## 2021-04-01 NOTE — ED Provider Notes (Signed)
[facility] EMERGENCY DEPARTMENT Provider Note   CSN: [id number] Arrival date & time: 04/01/21  1123     History Chief Complaint  Patient presents with   Shoulder Pain    Matthew Alvarez is a 30 y.o. male.  HPI  Patient presents with shoulder pain.  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  He states this is why he thinks the pain is worse.  Denies any acute injury or insult to the arm.  Pain is worsened by heavy lifting, also present by putting his hands behind his back or above his head.  He has tried icy hot, there is been some relief.  He is also tried ibuprofen with minimal relief.  No prior surgeries to the scapula.  Pain is constant, has not been worsening.  Past Medical History:  Diagnosis Date   GERD (gastroesophageal reflux disease)    Hypertension     Patient Active Problem List   Diagnosis Date Noted   CLOSED FRACTURE OF NECK OF RADIUS 07/09/2010    Past Surgical History:  Procedure Laterality Date   TONSILLECTOMY AND ADENOIDECTOMY         History reviewed. No pertinent family history.  Social History   Tobacco Use   Smoking status: Never   Smokeless tobacco: Never  Vaping Use   Vaping Use: Never used  Substance Use Topics   Alcohol use: Yes    Comment: occ. use   Drug use: No    Home Medications Prior to Admission medications   Medication Sig Start Date End Date Taking? Authorizing Provider  acetaminophen (TYLENOL) 500 MG tablet Take 500 mg by mouth every 6 (six) hours as needed for mild pain or moderate pain.    [provider]  celecoxib (CELEBREX) 200 MG capsule Take 1 capsule (200 mg total) by mouth 2 (two) times daily. Patient not taking: No sig reported 10/27/19   Arthor Captain, PA-C  cyclobenzaprine (FLEXERIL) 10 MG tablet Take 0.5-1 tablets (5-10 mg total) by mouth 2 (two) times daily as needed for muscle spasms. Patient not taking: No sig reported 10/27/19   Arthor Captain, PA-C   lidocaine (LIDODERM) 5 % Place 1 patch onto the skin daily. Remove & Discard patch within 12 hours or as directed by MD Patient not taking: No sig reported 05/30/19   Henderly, Britni A, PA-C  methylPREDNISolone (MEDROL DOSEPAK) 4 MG TBPK tablet Use as directed Patient not taking: No sig reported 10/27/19   Arthor Captain, PA-C  omeprazole (PRILOSEC) 20 MG capsule Take 20 mg by mouth daily as needed (for acid reflux).    [provider]    Allergies    Clindamycin/lincomycin and Penicillins  Review of Systems   Review of Systems  Constitutional:  Negative for fever.  Respiratory:  Positive for wheezing.   Musculoskeletal:  Positive for arthralgias and myalgias. Negative for gait problem.   Physical Exam Updated Vital Signs BP 139/80 (BP Location: Right Arm)   Pulse (!) 56   Temp 98.6 F (37 C) (Oral)   Resp 19   Ht 5\' 9"  (1.753 m)   Wt 105 kg   SpO2 98%   BMI 34.19 kg/m   Physical Exam Vitals and nursing note reviewed. Exam conducted with a chaperone present.  Constitutional:      General: He is not in acute distress.    Appearance: Normal appearance.  HENT:     Head: Normocephalic and atraumatic.  Eyes:     General: No scleral icterus.    Extraocular Movements: Extraocular movements intact.     Pupils: Pupils are equal, round, and reactive to light.  Cardiovascular:     Comments: Radial pulse 2+ bilaterally Musculoskeletal:        General: Tenderness present. No swelling or signs of injury. Normal range of motion.     Comments: Full range of motion although there is some tenderness with shoulder abduction.  Skin:    Coloration: Skin is not jaundiced.  Neurological:     Mental Status: He is alert. Mental status is at baseline.     Coordination: Coordination normal.    ED Results / Procedures / Treatments   Labs (all labs ordered are listed, but only abnormal results are displayed) Labs Reviewed - No data to display  EKG None  Radiology DG  Shoulder Right  Result Date: 04/01/2021 CLINICAL DATA:  Pain EXAM: RIGHT SHOULDER - 2+ VIEW COMPARISON:  None. FINDINGS: There is no acute fracture or dislocation. Shoulder alignment is normal. The joint spaces are preserved. The soft tissues are unremarkable. IMPRESSION: Normal shoulder radiographs. Electronically Signed   By: Lesia Hausen M.D.   On: 04/01/2021 12:50    Procedures Procedures   Medications Ordered in ED Medications - No data to display  ED Course  I have reviewed the triage vital signs and the nursing notes.  Pertinent labs & imaging results that were available during my care of the patient were reviewed by me and considered in my medical decision making (see chart for details).    MDM Rules/Calculators/A&P                           Vitals are stable, and no recent acute trauma.  Patient does have tenderness to the posterior shoulder, there is pain primarily with shoulder abduction.  Suspect muscle strain, x-ray was ordered in triage and negative for fracture.  No dislocation.  Patient is neurovascularly intact, stable for discharge.  Will give short course of anti-inflammatory and have him follow-up with primary care.  Final Clinical Impression(s) / ED Diagnoses Final diagnoses:  None    Rx / DC Orders ED Discharge Orders     None        Theron Arista, PA-C 04/01/21 1309    Sloan Leiter, DO 04/02/21 0725

## 2021-04-01 NOTE — Discharge Instructions (Addendum)
Take the anti-inflammatory medicine twice daily for 7 days. Use the lidocaine patches as needed as they help. Take the next 2 days off of work, try to do easy lifting after that. Follow-up with orthopedic if no improvement or things worsen.

## 2021-04-01 NOTE — ED Triage Notes (Signed)
Pain in both shoulders from lifting at work. States right shoulder hurts more than left

## 2021-04-01 NOTE — ED Triage Notes (Signed)
Needs note for work.

## 2021-05-13 ENCOUNTER — Encounter (HOSPITAL_COMMUNITY): Payer: Self-pay | Admitting: *Deleted

## 2021-05-13 ENCOUNTER — Other Ambulatory Visit: Payer: Self-pay

## 2021-05-13 ENCOUNTER — Emergency Department (HOSPITAL_COMMUNITY)
Admission: EM | Admit: 2021-05-13 | Discharge: 2021-05-14 | Disposition: A | Discharge status: Home / Self Care | Payer: Self-pay | Attending: Emergency Medicine | Admitting: Emergency Medicine

## 2021-05-13 DIAGNOSIS — R197 Diarrhea, unspecified: Secondary | ICD-10-CM | POA: Insufficient documentation

## 2021-05-13 DIAGNOSIS — R112 Nausea with vomiting, unspecified: Secondary | ICD-10-CM | POA: Insufficient documentation

## 2021-05-13 DIAGNOSIS — I10 Essential (primary) hypertension: Secondary | ICD-10-CM | POA: Insufficient documentation

## 2021-05-13 DIAGNOSIS — Z20822 Contact with and (suspected) exposure to covid-19: Secondary | ICD-10-CM | POA: Insufficient documentation

## 2021-05-13 DIAGNOSIS — Z8616 Personal history of COVID-19: Secondary | ICD-10-CM | POA: Insufficient documentation

## 2021-05-13 LAB — RESP PANEL BY RT-PCR (FLU A&B, COVID) ARPGX2
Influenza A by PCR: NEGATIVE
Influenza B by PCR: NEGATIVE
SARS Coronavirus 2 by RT PCR: NEGATIVE

## 2021-05-13 NOTE — ED Triage Notes (Signed)
Pt c/o vomiting that started last night

## 2021-05-14 MED ORDER — DIPHENOXYLATE-ATROPINE 2.5-0.025 MG PO TABS
ORAL_TABLET | ORAL | Status: AC
  Filled 2021-05-14: qty 1

## 2021-05-14 MED ORDER — LOPERAMIDE HCL 2 MG PO CAPS: 2.0000 mg | ORAL_CAPSULE | Freq: Four times a day (QID) | ORAL | 0 refills | Status: DC | PRN

## 2021-05-14 MED ORDER — ONDANSETRON HCL 4 MG PO TABS: 4.0000 mg | ORAL_TABLET | Freq: Four times a day (QID) | ORAL | 0 refills | Status: DC | PRN

## 2021-05-14 MED ORDER — DIPHENOXYLATE-ATROPINE 2.5-0.025 MG PO TABS
2.0000 | ORAL_TABLET | Freq: Once | ORAL | Status: AC
  Administered 2021-05-14: 2 via ORAL

## 2021-05-14 MED ORDER — ONDANSETRON 8 MG PO TBDP
ORAL_TABLET | ORAL | Status: AC
  Filled 2021-05-14: qty 1

## 2021-05-14 MED ORDER — ONDANSETRON 8 MG PO TBDP
8.0000 mg | ORAL_TABLET | Freq: Once | ORAL | Status: AC
  Administered 2021-05-14: 8 mg via ORAL

## 2021-05-14 NOTE — ED Provider Notes (Signed)
[facility] EMERGENCY DEPARTMENT Provider Note   CSN: [id number] Arrival date & time: 05/13/21  1952     History Chief Complaint  Patient presents with   Emesis    Matthew Alvarez is a 30 y.o. male.  Patient presents to the emergency department for evaluation of nausea and vomiting.  Has also had diarrhea.  No hematemesis or rectal bleeding.  Patient denies any specific abdominal pain.  He has not had any fever or upper respiratory infection symptoms.      Past Medical History:  Diagnosis Date   GERD (gastroesophageal reflux disease)    Hypertension     Patient Active Problem List   Diagnosis Date Noted   CLOSED FRACTURE OF NECK OF RADIUS 07/09/2010    Past Surgical History:  Procedure Laterality Date   TONSILLECTOMY AND ADENOIDECTOMY         History reviewed. No pertinent family history.  Social History   Tobacco Use   Smoking status: Never   Smokeless tobacco: Never  Vaping Use   Vaping Use: Never used  Substance Use Topics   Alcohol use: Yes    Comment: occ. use   Drug use: No    Home Medications Prior to Admission medications   Medication Sig Start Date End Date Taking? Authorizing Provider  loperamide (IMODIUM) 2 MG capsule Take 1 capsule (2 mg total) by mouth 4 (four) times daily as needed for diarrhea or loose stools. 05/14/21  Yes Mayer Vondrak, Canary Brim, MD  ondansetron (ZOFRAN) 4 MG tablet Take 1 tablet (4 mg total) by mouth every 6 (six) hours as needed for nausea or vomiting. 05/14/21  Yes Joanann Mies, Canary Brim, MD  acetaminophen (TYLENOL) 500 MG tablet Take 500 mg by mouth every 6 (six) hours as needed for mild pain or moderate pain.    [provider]  celecoxib (CELEBREX) 200 MG capsule Take 1 capsule (200 mg total) by mouth 2 (two) times daily. Patient not taking: No sig reported 10/27/19   Arthor Captain, PA-C  cyclobenzaprine (FLEXERIL) 10 MG tablet Take 0.5-1 tablets (5-10 mg total) by mouth 2 (two) times daily as needed for  muscle spasms. Patient not taking: No sig reported 10/27/19   Arthor Captain, PA-C  lidocaine (LIDODERM) 5 % Place 1 patch onto the skin daily. Remove & Discard patch within 12 hours or as directed by MD 04/01/21   Theron Arista, PA-C  methylPREDNISolone (MEDROL DOSEPAK) 4 MG TBPK tablet Use as directed Patient not taking: No sig reported 10/27/19   Arthor Captain, PA-C  naproxen (NAPROSYN) 500 MG tablet Take 1 tablet (500 mg total) by mouth 2 (two) times daily. 04/01/21   Theron Arista, PA-C  omeprazole (PRILOSEC) 20 MG capsule Take 20 mg by mouth daily as needed (for acid reflux).    [provider]    Allergies    Clindamycin/lincomycin and Penicillins  Review of Systems   Review of Systems  Constitutional:  Negative for fever.  Gastrointestinal:  Positive for diarrhea, nausea and vomiting.  All other systems reviewed and are negative.  Physical Exam Updated Vital Signs BP (!) 143/82 (BP Location: Right Arm)   Pulse (!) 59   Temp 97.8 F (36.6 C)   Resp 16   Ht 5\' 9"  (1.753 m)   Wt 102.1 kg   SpO2 99%   BMI 33.23 kg/m   Physical Exam Vitals and nursing note reviewed.  Constitutional:      General: He is not in acute distress.    Appearance:  Normal appearance. He is well-developed.  HENT:     Head: Normocephalic and atraumatic.     Right Ear: Hearing normal.     Left Ear: Hearing normal.     Nose: Nose normal.  Eyes:     Conjunctiva/sclera: Conjunctivae normal.     Pupils: Pupils are equal, round, and reactive to light.  Cardiovascular:     Rate and Rhythm: Regular rhythm.     Heart sounds: S1 normal and S2 normal. No murmur heard.   No friction rub. No gallop.  Pulmonary:     Effort: Pulmonary effort is normal. No respiratory distress.     Breath sounds: Normal breath sounds.  Chest:     Chest wall: No tenderness.  Abdominal:     General: Bowel sounds are normal.     Palpations: Abdomen is soft.     Tenderness: There is no abdominal tenderness. There is  no guarding or rebound. Negative signs include Murphy's sign and McBurney's sign.     Hernia: No hernia is present.  Musculoskeletal:        General: Normal range of motion.     Cervical back: Normal range of motion and neck supple.  Skin:    General: Skin is warm and dry.     Findings: No rash.  Neurological:     Mental Status: He is alert and oriented to person, place, and time.     GCS: GCS eye subscore is 4. GCS verbal subscore is 5. GCS motor subscore is 6.     Cranial Nerves: No cranial nerve deficit.     Sensory: No sensory deficit.     Coordination: Coordination normal.  Psychiatric:        Speech: Speech normal.        Behavior: Behavior normal.        Thought Content: Thought content normal.    ED Results / Procedures / Treatments   Labs (all labs ordered are listed, but only abnormal results are displayed) Labs Reviewed  RESP PANEL BY RT-PCR (FLU A&B, COVID) ARPGX2    EKG None  Radiology No results found.  Procedures Procedures   Medications Ordered in ED Medications  ondansetron (ZOFRAN-ODT) 8 MG disintegrating tablet (has no administration in time range)  diphenoxylate-atropine (LOMOTIL) 2.5-0.025 MG per tablet (has no administration in time range)  diphenoxylate-atropine (LOMOTIL) 2.5-0.025 MG per tablet (has no administration in time range)  ondansetron (ZOFRAN-ODT) disintegrating tablet 8 mg (8 mg Oral Given 05/14/21 0014)  diphenoxylate-atropine (LOMOTIL) 2.5-0.025 MG per tablet 2 tablet (2 tablets Oral Given 05/14/21 0014)    ED Course  I have reviewed the triage vital signs and the nursing notes.  Pertinent labs & imaging results that were available during my care of the patient were reviewed by me and considered in my medical decision making (see chart for details).    MDM Rules/Calculators/A&P                           Patient with nausea, vomiting and diarrhea.  He has a benign abdominal exam.  He is now improved, tolerating oral intake.   Vital signs are unremarkable.  We will treat symptomatically, given return precautions. Final Clinical Impression(s) / ED Diagnoses Final diagnoses:  Nausea vomiting and diarrhea    Rx / DC Orders ED Discharge Orders          Ordered    ondansetron (ZOFRAN) 4 MG tablet  Every 6 hours PRN  05/14/21 0007    loperamide (IMODIUM) 2 MG capsule  4 times daily PRN        05/14/21 0007             Gilda Crease, MD 05/16/21 [phone]

## 2021-06-01 ENCOUNTER — Emergency Department (HOSPITAL_COMMUNITY)
Admission: EM | Admit: 2021-06-01 | Discharge: 2021-06-01 | Disposition: A | Discharge status: Home / Self Care | Payer: Self-pay | Attending: Emergency Medicine | Admitting: Emergency Medicine

## 2021-06-01 ENCOUNTER — Other Ambulatory Visit: Payer: Self-pay

## 2021-06-01 ENCOUNTER — Encounter (HOSPITAL_COMMUNITY): Payer: Self-pay

## 2021-06-01 DIAGNOSIS — J069 Acute upper respiratory infection, unspecified: Secondary | ICD-10-CM | POA: Insufficient documentation

## 2021-06-01 DIAGNOSIS — Z20822 Contact with and (suspected) exposure to covid-19: Secondary | ICD-10-CM | POA: Insufficient documentation

## 2021-06-01 DIAGNOSIS — I10 Essential (primary) hypertension: Secondary | ICD-10-CM | POA: Insufficient documentation

## 2021-06-01 LAB — RESP PANEL BY RT-PCR (FLU A&B, COVID) ARPGX2
Influenza A by PCR: NEGATIVE
Influenza B by PCR: NEGATIVE
SARS Coronavirus 2 by RT PCR: NEGATIVE

## 2021-06-01 MED ORDER — ACETAMINOPHEN 325 MG PO TABS
650.0000 mg | ORAL_TABLET | Freq: Once | ORAL | Status: AC
  Administered 2021-06-01: 650 mg via ORAL
  Filled 2021-06-01: qty 2

## 2021-06-01 NOTE — ED Provider Notes (Signed)
[facility] EMERGENCY DEPARTMENT Provider Note   CSN: [id number] Arrival date & time: 06/01/21  1012     History Chief Complaint  Patient presents with   Sore Throat    Matthew Alvarez is a 30 y.o. male with history of hypertension and GERD presenting for evaluation of a 1 day history of nasal congestion with clear rhinorrhea, headache and sore throat.  He has taken Tylenol which resolved his headache.  He denies any obvious exposures to COVID or the flu but does work around a lot of people at work.  He denies nausea or vomiting, cough, shortness of breath, also denies chest pain, abdominal pain, diarrhea or any other complaints.  Patient is not COVID or influenza vaccinated.  The history is provided by the patient.      Past Medical History:  Diagnosis Date   GERD (gastroesophageal reflux disease)    Hypertension     Patient Active Problem List   Diagnosis Date Noted   CLOSED FRACTURE OF NECK OF RADIUS 07/09/2010    Past Surgical History:  Procedure Laterality Date   TONSILLECTOMY AND ADENOIDECTOMY         No family history on file.  Social History   Tobacco Use   Smoking status: Never   Smokeless tobacco: Never  Vaping Use   Vaping Use: Never used  Substance Use Topics   Alcohol use: Yes    Comment: occ. use   Drug use: No    Home Medications Prior to Admission medications   Medication Sig Start Date End Date Taking? Authorizing Provider  acetaminophen (TYLENOL) 500 MG tablet Take 500 mg by mouth every 6 (six) hours as needed for mild pain or moderate pain.    [provider]  celecoxib (CELEBREX) 200 MG capsule Take 1 capsule (200 mg total) by mouth 2 (two) times daily. Patient not taking: No sig reported 10/27/19   Arthor Captain, PA-C  cyclobenzaprine (FLEXERIL) 10 MG tablet Take 0.5-1 tablets (5-10 mg total) by mouth 2 (two) times daily as needed for muscle spasms. Patient not taking: No sig reported 10/27/19   Arthor Captain, PA-C   lidocaine (LIDODERM) 5 % Place 1 patch onto the skin daily. Remove & Discard patch within 12 hours or as directed by MD 04/01/21   Theron Arista, PA-C  loperamide (IMODIUM) 2 MG capsule Take 1 capsule (2 mg total) by mouth 4 (four) times daily as needed for diarrhea or loose stools. 05/14/21   Gilda Crease, MD  methylPREDNISolone (MEDROL DOSEPAK) 4 MG TBPK tablet Use as directed Patient not taking: No sig reported 10/27/19   Arthor Captain, PA-C  naproxen (NAPROSYN) 500 MG tablet Take 1 tablet (500 mg total) by mouth 2 (two) times daily. 04/01/21   Theron Arista, PA-C  omeprazole (PRILOSEC) 20 MG capsule Take 20 mg by mouth daily as needed (for acid reflux).    [provider]  ondansetron (ZOFRAN) 4 MG tablet Take 1 tablet (4 mg total) by mouth every 6 (six) hours as needed for nausea or vomiting. 05/14/21   Gilda Crease, MD    Allergies    Clindamycin/lincomycin and Penicillins  Review of Systems   Review of Systems  Constitutional:  Negative for chills and fever.  HENT:  Positive for congestion, rhinorrhea and sore throat. Negative for ear pain, sinus pressure, trouble swallowing and voice change.   Eyes:  Negative for discharge.  Respiratory:  Negative for cough, shortness of breath, wheezing and stridor.   Cardiovascular:  Negative for chest pain.  Gastrointestinal:  Negative for abdominal pain, nausea and vomiting.  Genitourinary: Negative.   Musculoskeletal: Negative.   Skin: Negative.   Neurological:  Positive for headaches.  All other systems reviewed and are negative.  Physical Exam Updated Vital Signs BP 123/82 (BP Location: Right Arm)    Pulse (!) 57    Temp 98 F (36.7 C) (Oral)    Resp 16    Ht 5\' 9"  (1.753 m)    Wt 102.1 kg    SpO2 96%    BMI 33.23 kg/m   Physical Exam Constitutional:      Appearance: He is well-developed.  HENT:     Head: Normocephalic and atraumatic.     Comments: Tonsils are absent.    Right Ear: Tympanic membrane and  ear canal normal.     Left Ear: Tympanic membrane and ear canal normal.     Nose: Mucosal edema and rhinorrhea present.     Mouth/Throat:     Mouth: Mucous membranes are moist.     Pharynx: Oropharynx is clear. Uvula midline. No oropharyngeal exudate or posterior oropharyngeal erythema.     Tonsils: No tonsillar abscesses.     Comments: Mild erythema, no exudate. Eyes:     Conjunctiva/sclera: Conjunctivae normal.  Cardiovascular:     Rate and Rhythm: Normal rate.     Heart sounds: Normal heart sounds.  Pulmonary:     Effort: Pulmonary effort is normal. No respiratory distress.     Breath sounds: No wheezing or rales.  Abdominal:     Palpations: Abdomen is soft.     Tenderness: There is no abdominal tenderness.  Musculoskeletal:        General: Normal range of motion.  Lymphadenopathy:     Head:     Right side of head: No tonsillar adenopathy.     Left side of head: No tonsillar adenopathy.     Cervical: No cervical adenopathy.     Comments: No head or neck adenopathy.  Skin:    General: Skin is warm and dry.     Findings: No rash.  Neurological:     Mental Status: He is alert and oriented to person, place, and time.    ED Results / Procedures / Treatments   Labs (all labs ordered are listed, but only abnormal results are displayed) Labs Reviewed  RESP PANEL BY RT-PCR (FLU A&B, COVID) ARPGX2    EKG None  Radiology No results found.  Procedures Procedures   Medications Ordered in ED Medications  acetaminophen (TYLENOL) tablet 650 mg (650 mg Oral Given 06/01/21 1102)    ED Course  I have reviewed the triage vital signs and the nursing notes.  Pertinent labs & imaging results that were available during my care of the patient were reviewed by me and considered in my medical decision making (see chart for details).    MDM Rules/Calculators/A&P                         Labs reviewed and discussed with patient.  He does have symptoms suggesting a viral URI.He was  given recommendations for treatment of his symptoms, return precautions were also outlined.  Parent follow-up anticipated.    Final Clinical Impression(s) / ED Diagnoses Final diagnoses:  Viral upper respiratory tract infection    Rx / DC Orders ED Discharge Orders     None        06/03/21, Burgess Amor 06/01/21 1241  Maia Plan, MD 06/02/21 [phone]

## 2021-06-01 NOTE — Discharge Instructions (Signed)
Your COVID and flu tests are negative today.  Your symptoms suggest of viral upper respiratory infection.  Rest and make sure you are drinking plenty of fluids.  You may continue taking Tylenol for headache and throat pain relief.  I also suggest sore throat lozenges, Cepacol throat drops are especially helpful for relieving throat pain.

## 2021-06-01 NOTE — ED Triage Notes (Signed)
Pt presents to ED with complaints of sore throat, congestion, and headache started last night.

## 2021-07-24 ENCOUNTER — Other Ambulatory Visit: Payer: Self-pay

## 2021-07-24 ENCOUNTER — Emergency Department (HOSPITAL_COMMUNITY)
Admission: EM | Admit: 2021-07-24 | Discharge: 2021-07-24 | Disposition: A | Discharge status: Home / Self Care | Payer: Self-pay | Attending: Emergency Medicine | Admitting: Emergency Medicine

## 2021-07-24 ENCOUNTER — Encounter (HOSPITAL_COMMUNITY): Payer: Self-pay | Admitting: Emergency Medicine

## 2021-07-24 DIAGNOSIS — X58XXXA Exposure to other specified factors, initial encounter: Secondary | ICD-10-CM | POA: Insufficient documentation

## 2021-07-24 DIAGNOSIS — T161XXA Foreign body in right ear, initial encounter: Secondary | ICD-10-CM | POA: Insufficient documentation

## 2021-07-24 NOTE — ED Provider Notes (Signed)
[facility] EMERGENCY DEPARTMENT Provider Note   CSN: [id number] Arrival date & time: 07/24/21  0114     History  Chief Complaint  Patient presents with   Ear Pain    Matthew Alvarez is a 31 y.o. male.  The history is provided by the patient.  Foreign Body in Ear This is a new problem. The current episode started 1 to 2 hours ago. The problem occurs constantly. The problem has not changed since onset.Nothing aggravates the symptoms. Nothing relieves the symptoms.  Patient reports he was at work and he had his ear buds in his ears.  When he took them out he noted that the earbud was stuck in his ear.  He reports pain and muffled hearing. No other complaints    Home Medications Prior to Admission medications   Medication Sig Start Date End Date Taking? Authorizing Provider  acetaminophen (TYLENOL) 500 MG tablet Take 500 mg by mouth every 6 (six) hours as needed for mild pain or moderate pain.    [provider]  lidocaine (LIDODERM) 5 % Place 1 patch onto the skin daily. Remove & Discard patch within 12 hours or as directed by MD 04/01/21   Theron Arista, PA-C  omeprazole (PRILOSEC) 20 MG capsule Take 20 mg by mouth daily as needed (for acid reflux).    [provider]  ondansetron (ZOFRAN) 4 MG tablet Take 1 tablet (4 mg total) by mouth every 6 (six) hours as needed for nausea or vomiting. 05/14/21   Gilda Crease, MD      Allergies    Clindamycin/lincomycin and Penicillins    Review of Systems   Review of Systems  Constitutional:  Negative for fever.  HENT:  Positive for ear pain. Negative for ear discharge and tinnitus.    Physical Exam Updated Vital Signs BP 130/79    Pulse 67    Temp 97.6 F (36.4 C) (Oral)    Resp 18    Ht 1.753 m (5\' 9" )    Wt 108.9 kg    SpO2 99%    BMI 35.44 kg/m  Physical Exam CONSTITUTIONAL: Well developed/well nourished HEAD: Normocephalic/atraumatic EYES: EOMI/PERRL ENMT: Mucous membranes moist, left TM clear and  intact.  Right TM is occluded by foreign body NECK: supple no meningeal signs ABDOMEN: soft NEURO: Pt is awake/alert/appropriate, moves all extremitiesx4.  No facial droop.   EXTREMITIES:  full ROM SKIN: warm, color normal PSYCH: anxious  ED Results / Procedures / Treatments   Labs (all labs ordered are listed, but only abnormal results are displayed) Labs Reviewed - No data to display  EKG None  Radiology No results found.  Procedures .Foreign Body Removal  Date/Time: 07/24/2021 2:19 AM Performed by: 09/21/2021, MD Authorized by: Zadie Rhine, MD  Consent: Verbal consent obtained. Body area: ear Location details: right ear Localization method: ENT speculum Removal mechanism: alligator forceps Complexity: simple 1 objects recovered. Objects recovered: ear bud Post-procedure assessment: foreign body removed Patient tolerance: patient tolerated the procedure well with no immediate complications Comments: Right TM clear/intact after procedure     Medications Ordered in ED Medications - No data to display  ED Course/ Medical Decision Making/ A&P                           Medical Decision Making          Final Clinical Impression(s) / ED Diagnoses Final diagnoses:  Foreign body of right ear, initial  encounter    Rx / DC Orders ED Discharge Orders     None         Zadie Rhine, MD 07/24/21 [phone]

## 2021-07-24 NOTE — ED Triage Notes (Signed)
Pt c/o right ear pain and thinks maybe has a piece of an ear bud stuck in the ear.

## 2021-07-30 ENCOUNTER — Other Ambulatory Visit: Payer: Self-pay

## 2021-07-30 ENCOUNTER — Emergency Department (HOSPITAL_COMMUNITY)
Admission: EM | Admit: 2021-07-30 | Discharge: 2021-07-31 | Disposition: A | Discharge status: Home / Self Care | Payer: Self-pay | Attending: Emergency Medicine | Admitting: Emergency Medicine

## 2021-07-30 DIAGNOSIS — R0981 Nasal congestion: Secondary | ICD-10-CM | POA: Insufficient documentation

## 2021-07-30 DIAGNOSIS — J069 Acute upper respiratory infection, unspecified: Secondary | ICD-10-CM

## 2021-07-30 DIAGNOSIS — Z20822 Contact with and (suspected) exposure to covid-19: Secondary | ICD-10-CM | POA: Insufficient documentation

## 2021-07-30 DIAGNOSIS — R059 Cough, unspecified: Secondary | ICD-10-CM | POA: Insufficient documentation

## 2021-07-30 LAB — RESP PANEL BY RT-PCR (FLU A&B, COVID) ARPGX2
Influenza A by PCR: NEGATIVE
Influenza B by PCR: NEGATIVE
SARS Coronavirus 2 by RT PCR: NEGATIVE

## 2021-07-30 NOTE — ED Triage Notes (Signed)
Cough and congestion since yesterday. Wants covid test.  Denies fever. Has been taking otc meds. Denies sick contacts.

## 2021-07-31 NOTE — ED Notes (Signed)
Went over dc papers. All questions answered. Ambulatory to lobby .  

## 2021-07-31 NOTE — Discharge Instructions (Signed)
Drink plenty of fluids and get plenty of rest.  Take over-the-counter medications as needed for symptom relief.  Return to the ER if symptoms significantly worsen or change.

## 2021-07-31 NOTE — ED Provider Notes (Signed)
[facility] EMERGENCY DEPARTMENT Provider Note   CSN: [id number] Arrival date & time: 07/30/21  2059     History  Chief Complaint  Patient presents with   Nasal Congestion   Cough    Matthew Alvarez is a 31 y.o. male.  Patient is a 31 year old male with no significant past medical history.  He presents with a 1 day history of cough and congestion.  He tells me he did a COVID test at home that was mildly positive.  His cough has been productive of yellow sputum.  He denies any chest pain or difficulty breathing.  He denies any ill contacts.  The history is provided by the patient.  Cough Cough characteristics:  Productive Sputum characteristics:  Yellow Severity:  Moderate Onset quality:  Sudden Duration:  1 day Timing:  Constant Progression:  Worsening Chronicity:  New Relieved by:  Nothing Worsened by:  Nothing Ineffective treatments:  None tried     Home Medications Prior to Admission medications   Medication Sig Start Date End Date Taking? Authorizing Provider  acetaminophen (TYLENOL) 500 MG tablet Take 500 mg by mouth every 6 (six) hours as needed for mild pain or moderate pain.    [provider]  lidocaine (LIDODERM) 5 % Place 1 patch onto the skin daily. Remove & Discard patch within 12 hours or as directed by MD 04/01/21   Theron Arista, PA-C  omeprazole (PRILOSEC) 20 MG capsule Take 20 mg by mouth daily as needed (for acid reflux).    [provider]  ondansetron (ZOFRAN) 4 MG tablet Take 1 tablet (4 mg total) by mouth every 6 (six) hours as needed for nausea or vomiting. 05/14/21   Gilda Crease, MD      Allergies    Clindamycin/lincomycin and Penicillins    Review of Systems   Review of Systems  Respiratory:  Positive for cough.   All other systems reviewed and are negative.  Physical Exam Updated Vital Signs BP (!) 152/86 (BP Location: Right Arm)    Pulse 65    Temp 98.1 F (36.7 C) (Oral)    Resp 18    Ht 5\' 9"  (1.753 m)     Wt 108.9 kg    SpO2 97%    BMI 35.44 kg/m  Physical Exam Vitals and nursing note reviewed.  Constitutional:      General: He is not in acute distress.    Appearance: He is well-developed. He is not diaphoretic.  HENT:     Head: Normocephalic and atraumatic.  Cardiovascular:     Rate and Rhythm: Normal rate and regular rhythm.     Heart sounds: No murmur heard.   No friction rub.  Pulmonary:     Effort: Pulmonary effort is normal. No respiratory distress.     Breath sounds: Normal breath sounds. No wheezing or rales.  Abdominal:     General: Bowel sounds are normal. There is no distension.     Palpations: Abdomen is soft.     Tenderness: There is no abdominal tenderness.  Musculoskeletal:        General: Normal range of motion.     Cervical back: Normal range of motion and neck supple.  Skin:    General: Skin is warm and dry.  Neurological:     Mental Status: He is alert and oriented to person, place, and time.     Coordination: Coordination normal.    ED Results / Procedures / Treatments   Labs (all labs ordered  are listed, but only abnormal results are displayed) Labs Reviewed  RESP PANEL BY RT-PCR (FLU A&B, COVID) ARPGX2    EKG None  Radiology No results found.  Procedures Procedures    Medications Ordered in ED Medications - No data to display  ED Course/ Medical Decision Making/ A&P  Patient presenting with URI symptoms, likely viral in nature.  His COVID test is negative.  Vital signs are stable with no hypoxia and he appears clinically well.  Patient to be discharged with over-the-counter medications and return as needed.  Final Clinical Impression(s) / ED Diagnoses Final diagnoses:  None    Rx / DC Orders ED Discharge Orders     None         Geoffery Lyons, MD 07/31/21 [phone]

## 2021-08-10 ENCOUNTER — Other Ambulatory Visit: Payer: Self-pay

## 2021-08-10 ENCOUNTER — Encounter (HOSPITAL_COMMUNITY): Payer: Self-pay

## 2021-08-10 ENCOUNTER — Emergency Department (HOSPITAL_COMMUNITY)
Admission: EM | Admit: 2021-08-10 | Discharge: 2021-08-10 | Disposition: A | Discharge status: Home / Self Care | Payer: Self-pay | Attending: Emergency Medicine | Admitting: Emergency Medicine

## 2021-08-10 DIAGNOSIS — J069 Acute upper respiratory infection, unspecified: Secondary | ICD-10-CM | POA: Insufficient documentation

## 2021-08-10 MED ORDER — DOXYCYCLINE HYCLATE 100 MG PO CAPS: 100.0000 mg | ORAL_CAPSULE | Freq: Two times a day (BID) | ORAL | 0 refills | Status: DC

## 2021-08-10 MED ORDER — DOXYCYCLINE HYCLATE 100 MG PO TABS
100.0000 mg | ORAL_TABLET | Freq: Once | ORAL | Status: AC
  Administered 2021-08-10: 100 mg via ORAL
  Filled 2021-08-10: qty 1

## 2021-08-10 NOTE — ED Triage Notes (Signed)
Pov from home. Cc of sore throat. Was here over a week ago and was old to come back to get re-checked if he never felt better. Has been taking otc meds. Has not had anything in the last 6 hours.

## 2021-08-10 NOTE — Discharge Instructions (Signed)
Begin taking doxycycline as prescribed.  Take ibuprofen 600 mg every 6 hours as needed for pain.  Continue over-the-counter medications as needed for relief of symptoms.

## 2021-08-10 NOTE — ED Provider Notes (Signed)
[facility] EMERGENCY DEPARTMENT Provider Note   CSN: [id number] Arrival date & time: 08/10/21  0533     History  Chief Complaint  Patient presents with   Sore Throat    Matthew Alvarez is a 31 y.o. male.  Patient is a 31 year old male with history of GERD presenting with URI symptoms.  He describes a nearly 2-week history of chest congestion, cough that is productive of green sputum, and feeling generally unwell.  He was seen here 1 week ago with similar complaints, but returns stating that over-the-counter medications are not helping.  He denies fevers or chills.  He denies chest pain or difficulty breathing.  He reports sore throat, but no ill contacts.  The history is provided by the patient.      Home Medications Prior to Admission medications   Medication Sig Start Date End Date Taking? Authorizing Provider  acetaminophen (TYLENOL) 500 MG tablet Take 500 mg by mouth every 6 (six) hours as needed for mild pain or moderate pain.    [provider]  lidocaine (LIDODERM) 5 % Place 1 patch onto the skin daily. Remove & Discard patch within 12 hours or as directed by MD 04/01/21   Theron Arista, PA-C  omeprazole (PRILOSEC) 20 MG capsule Take 20 mg by mouth daily as needed (for acid reflux).    [provider]  ondansetron (ZOFRAN) 4 MG tablet Take 1 tablet (4 mg total) by mouth every 6 (six) hours as needed for nausea or vomiting. 05/14/21   Gilda Crease, MD      Allergies    Clindamycin/lincomycin and Penicillins    Review of Systems   Review of Systems  All other systems reviewed and are negative.  Physical Exam Updated Vital Signs BP 131/83    Pulse (!) 58    Temp 97.9 F (36.6 C) (Oral)    Resp 18    Ht 5\' 9"  (1.753 m)    Wt 108.9 kg    SpO2 98%    BMI 35.44 kg/m  Physical Exam Vitals and nursing note reviewed.  Constitutional:      General: He is not in acute distress.    Appearance: He is well-developed. He is not diaphoretic.  HENT:      Head: Normocephalic and atraumatic.     Right Ear: Tympanic membrane normal.     Left Ear: Tympanic membrane normal.     Mouth/Throat:     Mouth: Mucous membranes are moist.     Pharynx: No oropharyngeal exudate or posterior oropharyngeal erythema.  Cardiovascular:     Rate and Rhythm: Normal rate and regular rhythm.     Heart sounds: No murmur heard.   No friction rub.  Pulmonary:     Effort: Pulmonary effort is normal. No respiratory distress.     Breath sounds: Normal breath sounds. No wheezing or rales.  Abdominal:     General: Bowel sounds are normal. There is no distension.     Palpations: Abdomen is soft.     Tenderness: There is no abdominal tenderness.  Musculoskeletal:        General: Normal range of motion.     Cervical back: Normal range of motion and neck supple.  Skin:    General: Skin is warm and dry.  Neurological:     Mental Status: He is alert and oriented to person, place, and time.     Coordination: Coordination normal.    ED Results / Procedures / Treatments   Labs (  all labs ordered are listed, but only abnormal results are displayed) Labs Reviewed - No data to display  EKG None  Radiology No results found.  Procedures Procedures    Medications Ordered in ED Medications  doxycycline (VIBRA-TABS) tablet 100 mg (has no administration in time range)    ED Course/ Medical Decision Making/ A&P  Patient with prolonged URI.  He will be treated with doxycycline and follow-up as needed.  Final Clinical Impression(s) / ED Diagnoses Final diagnoses:  None    Rx / DC Orders ED Discharge Orders     None         Geoffery Lyons, MD 08/10/21 [phone]

## 2021-09-14 ENCOUNTER — Other Ambulatory Visit: Payer: Self-pay

## 2021-09-14 ENCOUNTER — Encounter (HOSPITAL_COMMUNITY): Payer: Self-pay | Admitting: Emergency Medicine

## 2021-09-14 ENCOUNTER — Emergency Department (HOSPITAL_COMMUNITY)
Admission: EM | Admit: 2021-09-14 | Discharge: 2021-09-14 | Disposition: A | Discharge status: Home / Self Care | Payer: Self-pay | Attending: Emergency Medicine | Admitting: Emergency Medicine

## 2021-09-14 DIAGNOSIS — J020 Streptococcal pharyngitis: Secondary | ICD-10-CM

## 2021-09-14 DIAGNOSIS — J029 Acute pharyngitis, unspecified: Secondary | ICD-10-CM | POA: Insufficient documentation

## 2021-09-14 MED ORDER — CEPHALEXIN 500 MG PO CAPS
500.0000 mg | ORAL_CAPSULE | Freq: Once | ORAL | Status: AC
  Administered 2021-09-14: 500 mg via ORAL
  Filled 2021-09-14: qty 1

## 2021-09-14 MED ORDER — CEPHALEXIN 500 MG PO CAPS: 500.0000 mg | ORAL_CAPSULE | Freq: Four times a day (QID) | ORAL | 0 refills | Status: DC

## 2021-09-14 NOTE — Discharge Instructions (Signed)
Begin taking Keflex as prescribed. ? ?Take ibuprofen 600 mg every 6 hours as needed for pain. ? ?Drink plenty of fluids and get plenty of rest. ?

## 2021-09-14 NOTE — ED Triage Notes (Signed)
Pt with c/o sore throat x 3 days. 

## 2021-09-14 NOTE — ED Provider Notes (Signed)
?[facility] ?Provider Note ? ? ?CSN: [id number] ?Arrival date & time: 09/14/21  0131 ? ?  ? ?History ? ?Chief Complaint  ?Patient presents with  ? Sore Throat  ? ? ?Matthew Alvarez is a 31 y.o. male. ? ?Patient is a 31 year old male with no significant past medical history.  Patient presenting with complaints of sore throat.  This has been worsening over the past 3 days.  He denies fevers or chills.  He does report that his 2 children at home have been sick this past week with strep throat.  Pain is worse with swallowing.  No alleviating factors. ? ?The history is provided by the patient.  ? ?  ? ?Home Medications ?Prior to Admission medications   ?Medication Sig Start Date End Date Taking? Authorizing Provider  ?acetaminophen (TYLENOL) 500 MG tablet Take 500 mg by mouth every 6 (six) hours as needed for mild pain or moderate pain.    [provider]  ?doxycycline (VIBRAMYCIN) 100 MG capsule Take 1 capsule (100 mg total) by mouth 2 (two) times daily. One po bid x 7 days 08/10/21   Veryl Speak, MD  ?lidocaine (LIDODERM) 5 % Place 1 patch onto the skin daily. Remove & Discard patch within 12 hours or as directed by MD 04/01/21   Sherrill Raring, PA-C  ?omeprazole (PRILOSEC) 20 MG capsule Take 20 mg by mouth daily as needed (for acid reflux).    [provider]  ?ondansetron (ZOFRAN) 4 MG tablet Take 1 tablet (4 mg total) by mouth every 6 (six) hours as needed for nausea or vomiting. 05/14/21   Orpah Greek, MD  ?   ? ?Allergies    ?Clindamycin/lincomycin and Penicillins   ? ?Review of Systems   ?Review of Systems  ?All other systems reviewed and are negative. ? ?Physical Exam ?Updated Vital Signs ?BP 127/88 (BP Location: Right Arm)   Pulse 62   Temp 98 ?F (36.7 ?C) (Oral)   Resp 18   Ht 5\' 9"  (1.753 m)   Wt 112.5 kg   SpO2 98%   BMI 36.62 kg/m?  ?Physical Exam ?Vitals and nursing note reviewed.  ?Constitutional:   ?   General: He is not in acute distress. ?    Appearance: He is well-developed. He is not diaphoretic.  ?HENT:  ?   Head: Normocephalic and atraumatic.  ?   Mouth/Throat:  ?   Mouth: Mucous membranes are moist.  ?   Pharynx: Posterior oropharyngeal erythema present. No oropharyngeal exudate.  ?Cardiovascular:  ?   Rate and Rhythm: Normal rate and regular rhythm.  ?   Heart sounds: No murmur heard. ?  No friction rub.  ?Pulmonary:  ?   Effort: Pulmonary effort is normal. No respiratory distress.  ?   Breath sounds: Normal breath sounds. No wheezing or rales.  ?Abdominal:  ?   General: Bowel sounds are normal. There is no distension.  ?   Palpations: Abdomen is soft.  ?   Tenderness: There is no abdominal tenderness.  ?Musculoskeletal:     ?   General: Normal range of motion.  ?   Cervical back: Normal range of motion and neck supple.  ?Skin: ?   General: Skin is warm and dry.  ?Neurological:  ?   Mental Status: He is alert and oriented to person, place, and time.  ?   Coordination: Coordination normal.  ? ? ?ED Results / Procedures / Treatments   ?Labs ?(all labs ordered are listed,  but only abnormal results are displayed) ?Labs Reviewed - No data to display ? ?EKG ?None ? ?Radiology ?No results found. ? ?Procedures ?Procedures  ? ? ?Medications Ordered in ED ?Medications  ?cephALEXin (KEFLEX) capsule 500 mg (has no administration in time range)  ? ? ?ED Course/ Medical Decision Making/ A&P ? ?Patient presenting with sore throat.  2 of his children at home have been sick with strep this past week.  I feel as though treatment is indicated.  I do not feel the need for testing given the known exposures.  Patient be given Keflex and ibuprofen. ? ?Final Clinical Impression(s) / ED Diagnoses ?Final diagnoses:  ?None  ? ? ?Rx / DC Orders ?ED Discharge Orders   ? ? None  ? ?  ? ? ?  ?Veryl Speak, MD ?09/14/21 [phone] ? ?

## 2021-09-22 ENCOUNTER — Encounter (HOSPITAL_COMMUNITY): Payer: Self-pay

## 2021-09-22 ENCOUNTER — Other Ambulatory Visit: Payer: Self-pay

## 2021-09-22 ENCOUNTER — Emergency Department (HOSPITAL_COMMUNITY)
Admission: EM | Admit: 2021-09-22 | Discharge: 2021-09-22 | Disposition: A | Discharge status: Home / Self Care | Payer: Self-pay | Attending: Emergency Medicine | Admitting: Emergency Medicine

## 2021-09-22 DIAGNOSIS — J029 Acute pharyngitis, unspecified: Secondary | ICD-10-CM | POA: Insufficient documentation

## 2021-09-22 DIAGNOSIS — Z20822 Contact with and (suspected) exposure to covid-19: Secondary | ICD-10-CM | POA: Insufficient documentation

## 2021-09-22 LAB — GROUP A STREP BY PCR: Group A Strep by PCR: NOT DETECTED

## 2021-09-22 LAB — RESP PANEL BY RT-PCR (FLU A&B, COVID) ARPGX2
Influenza A by PCR: NEGATIVE
Influenza B by PCR: NEGATIVE
SARS Coronavirus 2 by RT PCR: NEGATIVE

## 2021-09-22 MED ORDER — IBUPROFEN 800 MG PO TABS: 800.0000 mg | ORAL_TABLET | Freq: Three times a day (TID) | ORAL | 0 refills | Status: DC

## 2021-09-22 MED ORDER — AZITHROMYCIN 500 MG PO TABS: ORAL_TABLET | ORAL | 0 refills | Status: DC

## 2021-09-22 MED ORDER — AZITHROMYCIN 250 MG PO TABS
500.0000 mg | ORAL_TABLET | Freq: Once | ORAL | Status: AC
  Administered 2021-09-22: 500 mg via ORAL
  Filled 2021-09-22: qty 2

## 2021-09-22 MED ORDER — IBUPROFEN 800 MG PO TABS
800.0000 mg | ORAL_TABLET | Freq: Once | ORAL | Status: AC
  Administered 2021-09-22: 800 mg via ORAL
  Filled 2021-09-22: qty 1

## 2021-09-22 MED ORDER — DIPHENHYDRAMINE HCL 12.5 MG/5ML PO LIQD: 5.0000 mL | Freq: Three times a day (TID) | ORAL | 0 refills | Status: DC | PRN

## 2021-09-22 NOTE — ED Triage Notes (Signed)
Patient complaining of fever and sore throat that started the previous day.  ?

## 2021-09-22 NOTE — Discharge Instructions (Signed)
Stop the cephalexin and the doxycycline.  Start the azithromycin tomorrow.  Take the antibiotic as directed until finished.  Try to drink plenty of fluids.  Alternate Tylenol and the ibuprofen every 4 and 6 hours respectively for fever and/or body aches.  Follow-up with your primary care provider for recheck.  Return emergency department for any new or worsening symptoms. ?

## 2021-09-23 NOTE — ED Provider Notes (Signed)
?[facility] EMERGENCY DEPARTMENT ?Provider Note ? ? ?CSN: [id number] ?Arrival date & time: 09/22/21  1041 ? ?  ? ?History ? ?Chief Complaint  ?Patient presents with  ? Sore Throat  ? ? ?Matthew Alvarez is a 31 y.o. male. ? ? ?Sore Throat ?Pertinent negatives include no chest pain, no abdominal pain, no headaches and no shortness of breath.  ? ?  ? ?Matthew Alvarez is a 31 y.o. male who presents to the Emergency Department complaining of persistent sore throat fever and generalized body aches.  He was seen here on 09/14/2021 for same symptoms.  He was prescribed cephalexin for his symptoms, but he states he is unable to tolerate the medication as it is causing itching of his chest.  He denies rash or swelling of his face lips or tongue.  No shortness of breath.  States he continues to have fevers greater than 101 at home.  He denies any chest pain, cough, abdominal pain, nausea or vomiting, he no sick contacts at home. ? ?Home Medications ?Prior to Admission medications   ?Medication Sig Start Date End Date Taking? Authorizing Provider  ?azithromycin (ZITHROMAX) 500 MG tablet One tablet po once daily x 5 days 09/22/21  Yes Leaman Abe, PA-C  ?ibuprofen (ADVIL) 800 MG tablet Take 1 tablet (800 mg total) by mouth 3 (three) times daily. Take with food 09/22/21  Yes Clarine Elrod, PA-C  ?magic mouthwash (lidocaine, diphenhydrAMINE, alum & mag hydroxide) suspension Swish and spit 5 mLs 3 (three) times daily as needed for mouth pain. 09/22/21  Yes Regene Mccarthy, PA-C  ?acetaminophen (TYLENOL) 500 MG tablet Take 500 mg by mouth every 6 (six) hours as needed for mild pain or moderate pain.    [provider]  ?lidocaine (LIDODERM) 5 % Place 1 patch onto the skin daily. Remove & Discard patch within 12 hours or as directed by MD 04/01/21   Theron Arista, PA-C  ?omeprazole (PRILOSEC) 20 MG capsule Take 20 mg by mouth daily as needed (for acid reflux).    [provider]  ?ondansetron (ZOFRAN) 4 MG tablet Take 1  tablet (4 mg total) by mouth every 6 (six) hours as needed for nausea or vomiting. 05/14/21   Gilda Crease, MD  ?   ? ?Allergies    ?Clindamycin/lincomycin and Penicillins   ? ?Review of Systems   ?Review of Systems  ?Constitutional:  Positive for chills and fever.  ?HENT:  Positive for sore throat. Negative for congestion.   ?Respiratory:  Negative for cough and shortness of breath.   ?Cardiovascular:  Negative for chest pain.  ?Gastrointestinal:  Negative for abdominal pain, diarrhea, nausea and vomiting.  ?Musculoskeletal:  Positive for myalgias. Negative for neck pain and neck stiffness.  ?Neurological:  Negative for headaches.  ?All other systems reviewed and are negative. ? ?Physical Exam ?Updated Vital Signs ?BP (!) 145/72 (BP Location: Right Arm)   Pulse 96   Temp (!) 101 ?F (38.3 ?C) (Oral)   Resp 20   Ht 5\' 9"  (1.753 m)   Wt 117 kg   SpO2 100%   BMI 38.10 kg/m?  ?Physical Exam ?Vitals and nursing note reviewed.  ?Constitutional:   ?   Appearance: He is well-developed.  ?HENT:  ?   Right Ear: Tympanic membrane and ear canal normal.  ?   Left Ear: Tympanic membrane and ear canal normal.  ?   Mouth/Throat:  ?   Mouth: Mucous membranes are moist.  ?   Pharynx: Uvula midline.  ?  Comments: Erythema and mild edema of the oropharynx.  No tonsillar exudate.  Uvula midline nonedematous.  No bulging of the soft palate ?Cardiovascular:  ?   Rate and Rhythm: Normal rate and regular rhythm.  ?Pulmonary:  ?   Effort: Pulmonary effort is normal. No respiratory distress.  ?   Breath sounds: No wheezing.  ?Abdominal:  ?   Palpations: Abdomen is soft.  ?   Tenderness: There is no abdominal tenderness.  ?Lymphadenopathy:  ?   Cervical: Cervical adenopathy present.  ?Skin: ?   General: Skin is warm.  ?   Findings: No rash.  ?Neurological:  ?   Mental Status: He is alert.  ? ? ?ED Results / Procedures / Treatments   ?Labs ?(all labs ordered are listed, but only abnormal results are displayed) ?Labs Reviewed   ?RESP PANEL BY RT-PCR (FLU A&B, COVID) ARPGX2  ?GROUP A STREP BY PCR  ? ? ?EKG ?None ? ?Radiology ?No results found. ? ?Procedures ?Procedures  ? ? ?Medications Ordered in ED ?Medications  ?ibuprofen (ADVIL) tablet 800 mg (800 mg Oral Given 09/22/21 1317)  ?azithromycin (ZITHROMAX) tablet 500 mg (500 mg Oral Given 09/22/21 1317)  ? ? ?ED Course/ Medical Decision Making/ A&P ?  ?                        ?Medical Decision Making ?Risk ?Prescription drug management. ? ? ?Patient here for evaluation of fever generalized body aches and sore throat.  His symptoms have been persistent for several days.  He was here on 09/14/2021 for same symptoms.  Unable to take prescribed antibiotic as he reports having itching of his chest.  There is no evidence of allergic reaction or anaphylactic reaction on my exam.  Patient is well-appearing.  He is febrile here with temp of 101.  He has not taken any antipyretic. ? ?Strep, COVID and influenza testing are negative. ? ?Strep test may be negative as result of recent antibiotic use.  We will treat with another antibiotic, Magic mouthwash for his symptoms and prescription for 800 mg ibuprofen.  He is nontoxic-appearing.  Handles his secretions well.  I feel he is appropriate for discharge home. ? ? ? ? ? ? ? ?Final Clinical Impression(s) / ED Diagnoses ?Final diagnoses:  ?Acute pharyngitis, unspecified etiology  ? ? ?Rx / DC Orders ?ED Discharge Orders   ? ?      Ordered  ?  magic mouthwash (lidocaine, diphenhydrAMINE, alum & mag hydroxide) suspension  3 times daily PRN       ? 09/22/21 1314  ?  azithromycin (ZITHROMAX) 500 MG tablet       ? 09/22/21 1314  ?  ibuprofen (ADVIL) 800 MG tablet  3 times daily       ? 09/22/21 1314  ? ?  ?  ? ?  ? ? ?  ?Pauline Aus, PA-C ?09/23/21 1421 ? ?  ?Mancel Bale, MD ?09/23/21 1720 ? ?

## 2021-09-24 ENCOUNTER — Telehealth (HOSPITAL_COMMUNITY): Payer: Self-pay | Admitting: Emergency Medicine

## 2021-09-24 MED ORDER — MAGIC MOUTHWASH W/LIDOCAINE: 10.0000 mL | Freq: Four times a day (QID) | ORAL | 0 refills | Status: DC | PRN

## 2021-09-24 NOTE — Telephone Encounter (Signed)
Patient called in asking for a repeat prescription of his Magic mouthwash as his pharmacy was unable to compound this.  He has asked for it to be sent to [facility] which was completed for him. ?

## 2021-09-25 NOTE — Telephone Encounter (Signed)
Opened to repeat magic mouthwash prescription for escribe as it defaulted to print. ?

## 2021-12-12 ENCOUNTER — Other Ambulatory Visit: Payer: Self-pay

## 2021-12-12 ENCOUNTER — Encounter (HOSPITAL_COMMUNITY): Payer: Self-pay | Admitting: *Deleted

## 2021-12-12 ENCOUNTER — Emergency Department (HOSPITAL_COMMUNITY)
Admission: EM | Admit: 2021-12-12 | Discharge: 2021-12-12 | Disposition: A | Discharge status: Home / Self Care | Payer: Self-pay | Attending: Student | Admitting: Student

## 2021-12-12 DIAGNOSIS — I10 Essential (primary) hypertension: Secondary | ICD-10-CM | POA: Insufficient documentation

## 2021-12-12 DIAGNOSIS — R1110 Vomiting, unspecified: Secondary | ICD-10-CM | POA: Insufficient documentation

## 2021-12-12 DIAGNOSIS — R1013 Epigastric pain: Secondary | ICD-10-CM | POA: Insufficient documentation

## 2021-12-12 DIAGNOSIS — K2900 Acute gastritis without bleeding: Secondary | ICD-10-CM

## 2021-12-12 DIAGNOSIS — R197 Diarrhea, unspecified: Secondary | ICD-10-CM | POA: Insufficient documentation

## 2021-12-12 LAB — COMPREHENSIVE METABOLIC PANEL
ALT: 32 U/L (ref 0–44)
AST: 22 U/L (ref 15–41)
Albumin: 4.2 g/dL (ref 3.5–5.0)
Alkaline Phosphatase: 95 U/L (ref 38–126)
Anion gap: 6 (ref 5–15)
BUN: 6 mg/dL (ref 6–20)
CO2: 28 mmol/L (ref 22–32)
Calcium: 9.2 mg/dL (ref 8.9–10.3)
Chloride: 104 mmol/L (ref 98–111)
Creatinine, Ser: 1.05 mg/dL (ref 0.61–1.24)
GFR, Estimated: >60 mL/min (ref >60)
Glucose, Bld: 98 mg/dL (ref 70–99)
Potassium: 3.5 mmol/L (ref 3.5–5.1)
Sodium: 138 mmol/L (ref 135–145)
Total Bilirubin: 0.8 mg/dL (ref 0.3–1.2)
Total Protein: 7.5 g/dL (ref 6.5–8.1)

## 2021-12-12 LAB — URINALYSIS, ROUTINE W REFLEX MICROSCOPIC
Bilirubin Urine: NEGATIVE
Glucose, UA: NEGATIVE mg/dL
Ketones, ur: NEGATIVE mg/dL
Leukocytes,Ua: NEGATIVE
Nitrite: NEGATIVE
Protein, ur: 30 mg/dL — AB
Specific Gravity, Urine: 1.021 (ref 1.005–1.030)
pH: 6 (ref 5.0–8.0)

## 2021-12-12 LAB — CBC WITH DIFFERENTIAL/PLATELET
Abs Immature Granulocytes: 0.01 10*3/uL (ref 0.00–0.07)
Basophils Absolute: 0 10*3/uL (ref 0.0–0.1)
Basophils Relative: 0 %
Eosinophils Absolute: 0.2 10*3/uL (ref 0.0–0.5)
Eosinophils Relative: 2 %
HCT: 46.7 % (ref 39.0–52.0)
Hemoglobin: 15.1 g/dL (ref 13.0–17.0)
Immature Granulocytes: 0 %
Lymphocytes Relative: 39 %
Lymphs Abs: 2.8 10*3/uL (ref 0.7–4.0)
MCH: 27.1 pg (ref 26.0–34.0)
MCHC: 32.3 g/dL (ref 30.0–36.0)
MCV: 83.8 fL (ref 80.0–100.0)
Monocytes Absolute: 0.4 10*3/uL (ref 0.1–1.0)
Monocytes Relative: 6 %
Neutro Abs: 3.6 10*3/uL (ref 1.7–7.7)
Neutrophils Relative %: 53 %
Platelets: 267 10*3/uL (ref 150–400)
RBC: 5.57 MIL/uL (ref 4.22–5.81)
RDW: 12.9 % (ref 11.5–15.5)
WBC: 7 10*3/uL (ref 4.0–10.5)
nRBC: 0 % (ref 0.0–0.2)

## 2021-12-12 LAB — POC OCCULT BLOOD, ED

## 2021-12-12 LAB — LIPASE, BLOOD: Lipase: 26 U/L (ref 11–51)

## 2021-12-12 MED ORDER — FAMOTIDINE 20 MG PO TABS: 20.0000 mg | ORAL_TABLET | Freq: Two times a day (BID) | ORAL | 0 refills | Status: DC | PRN

## 2021-12-12 MED ORDER — OMEPRAZOLE 20 MG PO CPDR: 40.0000 mg | DELAYED_RELEASE_CAPSULE | Freq: Every day | ORAL | 0 refills | Status: DC

## 2021-12-12 NOTE — ED Provider Triage Note (Signed)
Emergency Medicine Provider Triage Evaluation Note  Matthew Alvarez , a 31 y.o. male  was evaluated in triage.  Pt complains of 24-hour history of epigastric and right upper quadrant pain along with nausea and diarrhea, no recognized fevers.  Pain is worse with food intake and has been intermittent.  Review of Systems  Positive: Abdominal pain, nausea, diarrhea Negative: Fever, vomiting  Physical Exam  BP 131/84 (BP Location: Right Arm)   Pulse 69   Temp (!) 97.5 F (36.4 C) (Oral)   Resp 16   Ht 5\' 9"  (1.753 m)   Wt 100.7 kg   SpO2 98%   BMI 32.78 kg/m  Gen:   Awake, no distress   Resp:  Normal effort  MSK:   Moves extremities without difficulty  Other:  Tender in the right upper quadrant and periumbilical region, no guarding  Medical Decision Making  Medically screening exam initiated at 1:28 PM.  Appropriate orders placed.  Matthew Alvarez was informed that the remainder of the evaluation will be completed by another provider, this initial triage assessment does not replace that evaluation, and the importance of remaining in the ED until their evaluation is complete.  Patient has been medically screened, initial labs have been ordered, patient may need imaging based on lab results.   , PA-C 12/12/21 1329

## 2021-12-12 NOTE — ED Provider Notes (Signed)
[facility] EMERGENCY DEPARTMENT Provider Note  CSN: [id number] Arrival date & time: 12/12/21 1141  Chief Complaint(s) Abdominal Pain  HPI Matthew Alvarez is a 31 y.o. male with PMH GERD, gastritis, HTN who presents emergency department for evaluation of abdominal pain and dark stools.  Patient states that for last 2 days he has had intermittent epigastric abdominal pain and darkening stools with associated intermittent vomiting and diarrhea.  He denies chest pain, shortness of breath, headache, fever or other systemic symptoms.  Denies blood thinner use or triggers to this abdominal pain.  He is on omeprazole daily.  Does not have a primary care physician and has not seen a gastroenterologist.   Past Medical History Past Medical History:  Diagnosis Date   GERD (gastroesophageal reflux disease)    Hypertension    Patient Active Problem List   Diagnosis Date Noted   CLOSED FRACTURE OF NECK OF RADIUS 07/09/2010   Home Medication(s) Prior to Admission medications   Medication Sig Start Date End Date Taking? Authorizing Provider  acetaminophen (TYLENOL) 500 MG tablet Take 500 mg by mouth every 6 (six) hours as needed for mild pain or moderate pain.   Yes [provider]  omeprazole (PRILOSEC) 20 MG capsule Take 20 mg by mouth daily as needed (for acid reflux).   Yes [provider]                                                                                                                                    Past Surgical History Past Surgical History:  Procedure Laterality Date   TONSILLECTOMY AND ADENOIDECTOMY     Family History History reviewed. No pertinent family history.  Social History Social History   Tobacco Use   Smoking status: Never   Smokeless tobacco: Never  Vaping Use   Vaping Use: Never used  Substance Use Topics   Alcohol use: Yes    Comment: occ. use   Drug use: No   Allergies Clindamycin/lincomycin and Penicillins  Review of  Systems Review of Systems  Gastrointestinal:  Positive for abdominal pain, blood in stool, nausea and vomiting.    Physical Exam Vital Signs  I have reviewed the triage vital signs BP 128/89   Pulse (!) 46   Temp (!) 97.5 F (36.4 C) (Oral)   Resp 16   Ht 5\' 9"  (1.753 m)   Wt 100.7 kg   SpO2 100%   BMI 32.78 kg/m   Physical Exam Constitutional:      General: He is not in acute distress.    Appearance: Normal appearance.  HENT:     Head: Normocephalic and atraumatic.     Nose: No congestion or rhinorrhea.  Eyes:     General:        Right eye: No discharge.        Left eye: No discharge.     Extraocular Movements: Extraocular movements intact.  Pupils: Pupils are equal, round, and reactive to light.  Cardiovascular:     Rate and Rhythm: Normal rate and regular rhythm.     Heart sounds: No murmur heard. Pulmonary:     Effort: No respiratory distress.     Breath sounds: No wheezing or rales.  Abdominal:     General: There is no distension.     Tenderness: There is no abdominal tenderness.  Musculoskeletal:        General: Normal range of motion.     Cervical back: Normal range of motion.  Skin:    General: Skin is warm and dry.  Neurological:     General: No focal deficit present.     Mental Status: He is alert.     ED Results and Treatments Labs (all labs ordered are listed, but only abnormal results are displayed) Labs Reviewed  URINALYSIS, ROUTINE W REFLEX MICROSCOPIC - Abnormal; Notable for the following components:      Result Value   APPearance HAZY (*)    Hgb urine dipstick SMALL (*)    Protein, ur 30 (*)    Bacteria, UA RARE (*)    All other components within normal limits  LIPASE, BLOOD  COMPREHENSIVE METABOLIC PANEL  CBC WITH DIFFERENTIAL/PLATELET                                                                                                                          Radiology No results found.  Pertinent labs & imaging results that were  available during my care of the patient were reviewed by me and considered in my medical decision making (see MDM for details).  Medications Ordered in ED Medications - No data to display                                                                                                                                   Procedures Procedures  (including critical care time)  Medical Decision Making / ED Course   This patient presents to the ED for concern of abdominal pain, this involves an extensive number of treatment options, and is a complaint that carries with it a high risk of complications and morbidity.  The differential diagnosis includes gastritis, GERD, pancreatitis, biliary pathology, intestinal gas  MDM: Patient seen emergency room for evaluation of abdominal pain and darker stools.  Physical exam with mild epigastric tenderness to palpation and rectal  exam unremarkable with fecal occult negative.  Laboratory evaluation unremarkable.  With stable vital signs, no significant leukocytosis, I have low suspicion for severe intra-abdominal infection.  Patient presentation likely consistent with uncontrolled GERD vs PUD and the patient was started on a PPI with an H2 blocker as needed.  He was given resources to establish care with a primary care physician and was discharged with outpatient follow-up.   Additional history obtained:  -External records from outside source obtained and reviewed including: Chart review including previous notes, labs, imaging, consultation notes   Lab Tests: -I ordered, reviewed, and interpreted labs.   The pertinent results include:   Labs Reviewed  URINALYSIS, ROUTINE W REFLEX MICROSCOPIC - Abnormal; Notable for the following components:      Result Value   APPearance HAZY (*)    Hgb urine dipstick SMALL (*)    Protein, ur 30 (*)    Bacteria, UA RARE (*)    All other components within normal limits  LIPASE, BLOOD  COMPREHENSIVE METABOLIC PANEL   CBC WITH DIFFERENTIAL/PLATELET       Medicines ordered and prescription drug management: No orders of the defined types were placed in this encounter.   -I have reviewed the patients home medicines and have made adjustments as needed  Critical interventions none    Cardiac Monitoring: The patient was maintained on a cardiac monitor.  I personally viewed and interpreted the cardiac monitored which showed an underlying rhythm of: NSR  Social Determinants of Health:  Factors impacting patients care include: no PCP   Reevaluation: After the interventions noted above, I reevaluated the patient and found that they have :stayed the same  Co morbidities that complicate the patient evaluation  Past Medical History:  Diagnosis Date   GERD (gastroesophageal reflux disease)    Hypertension       Dispostion: I considered admission for this patient, but he does not meet inpatient criteria for admission is safe for discharge with outpatient follow-up     Final Clinical Impression(s) / ED Diagnoses Final diagnoses:  None     @PCDICTATION @    , MD 12/12/21 [phone]

## 2021-12-12 NOTE — ED Triage Notes (Signed)
Pt with abd pain with N/v/D since last night.

## 2021-12-12 NOTE — ED Notes (Signed)
Pt provided discharge instructions and prescription information. Pt was given the opportunity to ask questions and questions were answered.   

## 2021-12-23 ENCOUNTER — Encounter (HOSPITAL_COMMUNITY): Payer: Self-pay

## 2021-12-23 ENCOUNTER — Emergency Department (HOSPITAL_COMMUNITY)
Admission: EM | Admit: 2021-12-23 | Discharge: 2021-12-23 | Disposition: A | Discharge status: Home / Self Care | Payer: Self-pay | Attending: Student | Admitting: Student

## 2021-12-23 ENCOUNTER — Other Ambulatory Visit: Payer: Self-pay

## 2021-12-23 DIAGNOSIS — Z202 Contact with and (suspected) exposure to infections with a predominantly sexual mode of transmission: Secondary | ICD-10-CM

## 2021-12-23 LAB — URINALYSIS, ROUTINE W REFLEX MICROSCOPIC
Bilirubin Urine: NEGATIVE
Glucose, UA: NEGATIVE mg/dL
Ketones, ur: NEGATIVE mg/dL
Leukocytes,Ua: NEGATIVE
Nitrite: NEGATIVE
Protein, ur: 30 mg/dL — AB
Specific Gravity, Urine: 1.032 — ABNORMAL HIGH (ref 1.005–1.030)
pH: 6 (ref 5.0–8.0)

## 2021-12-23 LAB — RAPID HIV SCREEN (HIV 1/2 AB+AG)
HIV 1/2 Antibodies: NONREACTIVE
HIV-1 P24 Antigen - HIV24: NONREACTIVE

## 2021-12-23 MED ORDER — DOXYCYCLINE HYCLATE 100 MG PO TABS
100.0000 mg | ORAL_TABLET | Freq: Once | ORAL | Status: AC
  Administered 2021-12-23: 100 mg via ORAL
  Filled 2021-12-23: qty 1

## 2021-12-23 MED ORDER — DOXYCYCLINE HYCLATE 100 MG PO CAPS: 100.0000 mg | ORAL_CAPSULE | Freq: Two times a day (BID) | ORAL | 0 refills | Status: AC

## 2021-12-23 NOTE — ED Triage Notes (Signed)
Pt reports dysuria that started about a day ago, pt thought he was drinking to many sodas, so stopped sodas and has been drinking a lot of water, but pain still persists only with urination. Pt reports last sexual intercourse was a week ago without protection, pt reports the girl he had intercourse with tested positive for chlamydia.

## 2021-12-23 NOTE — Discharge Instructions (Signed)
You are being prescribed an antibiotic tablet to cover you for your chlamydia exposure.  It is important that you take the entire 7-day course of this antibiotic.  Do not have sex until the medication is completed and you are symptom-free.  As discussed if your other cultures are positive for any other infections you will need to get treated for these as well.

## 2021-12-24 LAB — RPR: RPR Ser Ql: NONREACTIVE

## 2021-12-25 LAB — GC/CHLAMYDIA PROBE AMP (~~LOC~~) NOT AT ARMC
Chlamydia: NEGATIVE
Comment: NEGATIVE
Comment: NORMAL
Neisseria Gonorrhea: NEGATIVE

## 2021-12-25 NOTE — ED Provider Notes (Signed)
[facility] EMERGENCY DEPARTMENT Provider Note   CSN: [id number] Arrival date & time: 12/23/21  1741     History  Chief Complaint  Patient presents with   Dysuria    Matthew Alvarez is a 31 y.o. male.  The history is provided by the patient.  Dysuria Presenting symptoms: dysuria   Presenting symptoms: no penile discharge   Context: during intercourse   Relieved by:  Nothing Worsened by:  Urination Ineffective treatments:  None tried Associated symptoms: no abdominal pain, no fever, no flank pain, no genital lesions, no genital rash, no hematuria, no nausea, no scrotal swelling, no urinary frequency, no urinary incontinence, no urinary retention and no vomiting   Associated symptoms comment:  Denies penile dc Risk factors: STI exposure and unprotected sex   Risk factors comment:  Last intercourse 1 week ago, was informed his partner tested positive for chlamydia      Home Medications Prior to Admission medications   Medication Sig Start Date End Date Taking? Authorizing Provider  doxycycline (VIBRAMYCIN) 100 MG capsule Take 1 capsule (100 mg total) by mouth 2 (two) times daily for 7 days. 12/23/21 12/30/21 Yes Kailiana Granquist, Raynelle Fanning, PA-C  acetaminophen (TYLENOL) 500 MG tablet Take 500 mg by mouth every 6 (six) hours as needed for mild pain or moderate pain.    [provider]  famotidine (PEPCID) 20 MG tablet Take 1 tablet (20 mg total) by mouth 2 (two) times daily as needed for heartburn or indigestion. 12/12/21   Kommor, Madison, MD  omeprazole (PRILOSEC) 20 MG capsule Take 2 capsules (40 mg total) by mouth daily. 12/12/21 01/11/22  Kommor, Wyn Forster, MD      Allergies    Clindamycin/lincomycin and Penicillins    Review of Systems   Review of Systems  Constitutional:  Negative for chills and fever.  HENT:  Negative for congestion and sore throat.   Eyes: Negative.   Respiratory:  Negative for chest tightness and shortness of breath.   Cardiovascular:  Negative for chest  pain.  Gastrointestinal:  Negative for abdominal pain, nausea and vomiting.  Genitourinary:  Positive for dysuria. Negative for bladder incontinence, flank pain, frequency, genital sores, hematuria, penile discharge and scrotal swelling.  Musculoskeletal:  Negative for arthralgias, joint swelling and neck pain.  Skin: Negative.  Negative for rash and wound.  Neurological:  Negative for dizziness, weakness, light-headedness, numbness and headaches.  Psychiatric/Behavioral: Negative.    All other systems reviewed and are negative.   Physical Exam Updated Vital Signs BP (!) 148/78   Pulse (!) 59   Temp 97.9 F (36.6 C)   Resp 14   Ht 5\' 9"  (1.753 m)   Wt 100.7 kg   SpO2 100%   BMI 32.78 kg/m  Physical Exam Vitals and nursing note reviewed.  Constitutional:      Appearance: He is well-developed.  HENT:     Head: Normocephalic and atraumatic.  Eyes:     Conjunctiva/sclera: Conjunctivae normal.  Cardiovascular:     Rate and Rhythm: Normal rate and regular rhythm.     Heart sounds: Normal heart sounds.  Pulmonary:     Effort: Pulmonary effort is normal.     Breath sounds: Normal breath sounds. No wheezing.  Abdominal:     General: Bowel sounds are normal.     Palpations: Abdomen is soft.     Tenderness: There is no abdominal tenderness.  Genitourinary:    Comments: Deferred, specimen collected via urine Musculoskeletal:  General: Normal range of motion.     Cervical back: Normal range of motion.  Skin:    General: Skin is warm and dry.  Neurological:     General: No focal deficit present.     Mental Status: He is alert.     ED Results / Procedures / Treatments   Labs (all labs ordered are listed, but only abnormal results are displayed) Labs Reviewed  URINALYSIS, ROUTINE W REFLEX MICROSCOPIC - Abnormal; Notable for the following components:      Result Value   Specific Gravity, Urine 1.032 (*)    Hgb urine dipstick SMALL (*)    Protein, ur 30 (*)     Bacteria, UA RARE (*)    All other components within normal limits  RPR  RAPID HIV SCREEN (HIV 1/2 AB+AG)  GC/CHLAMYDIA PROBE AMP ([place]) NOT AT [facility]    EKG None  Radiology No results found.  Procedures Procedures    Medications Ordered in ED Medications  doxycycline (VIBRA-TABS) tablet 100 mg (100 mg Oral Given 12/23/21 2046)    ED Course/ Medical Decision Making/ A&P                           Medical Decision Making Pt with dysuria only, no penile dc or other complaint,  + exposure to chlamydia 1 week ago.  Unprotected sex.  We discussed tx plan, including recommended coverage for gonorrhea as well, given cultures will not result today.  Pt adamantly refuses IM medications and only wishes tx for the chlamydia at this time.  However, he was willing to have syphilis and hiv screening bloodwork drawn.  Pt is PCN allergic so will tx for chlamydia today, started on doxycycline.  Pt understands that if his cultures are positive for gonorrhea he will need additional tx.    Amount and/or Complexity of Data Reviewed Labs: ordered.  Risk Prescription drug management.           Final Clinical Impression(s) / ED Diagnoses Final diagnoses:  Exposure to chlamydia    Rx / DC Orders ED Discharge Orders          Ordered    doxycycline (VIBRAMYCIN) 100 MG capsule  2 times daily        12/23/21 2022              Burgess Amor, PA-C 12/25/21 1125    Kommor, Madison, MD 12/27/21 1141

## 2022-01-08 ENCOUNTER — Encounter (HOSPITAL_COMMUNITY): Payer: Self-pay | Admitting: *Deleted

## 2022-01-08 ENCOUNTER — Emergency Department (HOSPITAL_COMMUNITY)
Admission: EM | Admit: 2022-01-08 | Discharge: 2022-01-08 | Discharge status: Against Medical Advice | Payer: Self-pay | Attending: Emergency Medicine | Admitting: Emergency Medicine

## 2022-01-08 DIAGNOSIS — R197 Diarrhea, unspecified: Secondary | ICD-10-CM | POA: Insufficient documentation

## 2022-01-08 DIAGNOSIS — Z5321 Procedure and treatment not carried out due to patient leaving prior to being seen by health care provider: Secondary | ICD-10-CM | POA: Insufficient documentation

## 2022-01-08 DIAGNOSIS — R1013 Epigastric pain: Secondary | ICD-10-CM | POA: Insufficient documentation

## 2022-01-08 NOTE — ED Notes (Signed)
Pt declines having blood work done at this time

## 2022-01-08 NOTE — ED Triage Notes (Signed)
Pt in c/o epigastric pain onset x 3, takes Omeprazole but is not compliant, denies n/v/, x 3 loose stools today, A&O x4

## 2022-01-09 ENCOUNTER — Encounter (HOSPITAL_COMMUNITY): Payer: Self-pay | Admitting: Emergency Medicine

## 2022-01-09 ENCOUNTER — Other Ambulatory Visit: Payer: Self-pay

## 2022-01-09 ENCOUNTER — Emergency Department (HOSPITAL_COMMUNITY)
Admission: EM | Admit: 2022-01-09 | Discharge: 2022-01-09 | Disposition: A | Discharge status: Home / Self Care | Payer: Self-pay | Attending: Emergency Medicine | Admitting: Emergency Medicine

## 2022-01-09 DIAGNOSIS — K2970 Gastritis, unspecified, without bleeding: Secondary | ICD-10-CM | POA: Insufficient documentation

## 2022-01-09 MED ORDER — FAMOTIDINE 20 MG PO TABS: 20.0000 mg | ORAL_TABLET | Freq: Two times a day (BID) | ORAL | 1 refills | Status: DC

## 2022-01-09 MED ORDER — LIDOCAINE VISCOUS HCL 2 % MT SOLN
15.0000 mL | Freq: Once | OROMUCOSAL | Status: AC
  Administered 2022-01-09: 15 mL via ORAL
  Filled 2022-01-09: qty 15

## 2022-01-09 MED ORDER — FAMOTIDINE 20 MG PO TABS
40.0000 mg | ORAL_TABLET | Freq: Once | ORAL | Status: AC
  Administered 2022-01-09: 40 mg via ORAL
  Filled 2022-01-09: qty 2

## 2022-01-09 MED ORDER — ALUM & MAG HYDROXIDE-SIMETH 200-200-20 MG/5ML PO SUSP
30.0000 mL | Freq: Once | ORAL | Status: AC
  Administered 2022-01-09: 30 mL via ORAL
  Filled 2022-01-09: qty 30

## 2022-01-09 NOTE — ED Triage Notes (Signed)
C/o abdominal pain that started 3xdays ago in the epigastric region. Neita Carp makes it worse. Denies n/v. Pt has diarrhea that started 3xdays ago. Denies fever

## 2022-01-09 NOTE — ED Provider Notes (Signed)
[facility] EMERGENCY DEPARTMENT Provider Note   CSN: [id number] Arrival date & time: 01/09/22  9485     History  No chief complaint on file.   Matthew Alvarez is a 31 y.o. male present emerged department upset stomach.  Symptom onset about 3 days ago.  He has been having on and off epigastric discomfort, particularly in the mornings when he wakes up, with gurgling and bubbling in my stomach".  He says he was diagnosed gastritis in the past and been told he needs to see a GI doctor but has not made an appointment.  He started taking omeprazole that he bought over-the-counter but is not taking it consistently.  HPI     Home Medications Prior to Admission medications   Medication Sig Start Date End Date Taking? Authorizing Provider  famotidine (PEPCID) 20 MG tablet Take 1 tablet (20 mg total) by mouth 2 (two) times daily. Take 30 minutes before breakfast and dinner 01/09/22 02/08/22 Yes Gunnard Dorrance, Kermit Balo, MD  acetaminophen (TYLENOL) 500 MG tablet Take 500 mg by mouth every 6 (six) hours as needed for mild pain or moderate pain.    [provider]  famotidine (PEPCID) 20 MG tablet Take 1 tablet (20 mg total) by mouth 2 (two) times daily as needed for heartburn or indigestion. 12/12/21   Kommor, Madison, MD  omeprazole (PRILOSEC) 20 MG capsule Take 2 capsules (40 mg total) by mouth daily. 12/12/21 01/11/22  Kommor, Wyn Forster, MD      Allergies    Clindamycin/lincomycin and Penicillins    Review of Systems   Review of Systems  Physical Exam Updated Vital Signs BP 131/78 (BP Location: Right Arm)   Pulse (!) 52   Temp 97.7 F (36.5 C) (Oral)   Resp 17   Ht 5\' 9"  (1.753 m)   Wt 100.2 kg   SpO2 99%   BMI 32.64 kg/m  Physical Exam Constitutional:      General: He is not in acute distress. HENT:     Head: Normocephalic and atraumatic.  Eyes:     Conjunctiva/sclera: Conjunctivae normal.     Pupils: Pupils are equal, round, and reactive to light.  Cardiovascular:     Rate  and Rhythm: Normal rate and regular rhythm.  Pulmonary:     Effort: Pulmonary effort is normal. No respiratory distress.  Abdominal:     General: There is no distension.     Tenderness: There is no abdominal tenderness. There is no guarding or rebound.  Skin:    General: Skin is warm and dry.  Neurological:     General: No focal deficit present.     Mental Status: He is alert. Mental status is at baseline.  Psychiatric:        Mood and Affect: Mood normal.        Behavior: Behavior normal.     ED Results / Procedures / Treatments   Labs (all labs ordered are listed, but only abnormal results are displayed) Labs Reviewed - No data to display  EKG None  Radiology No results found.  Procedures Procedures    Medications Ordered in ED Medications  alum & mag hydroxide-simeth (MAALOX/MYLANTA) 200-200-20 MG/5ML suspension 30 mL (has no administration in time range)    And  lidocaine (XYLOCAINE) 2 % viscous mouth solution 15 mL (has no administration in time range)  famotidine (PEPCID) tablet 40 mg (has no administration in time range)    ED Course/ Medical Decision Making/ A&P  Medical Decision Making Risk OTC drugs. Prescription drug management.   Patient is here for epigastric discomfort.  Differential diagnosis include gastritis versus reflux versus peptic ulcer versus other.  Lower suspicion for acute biliary disease.  He has no focal abdominal tenderness.  He is well-appearing on exam.  Doubt sepsis or acute inflammatory surgical process.  I do not believe we need emergent imaging of the abdomen at this time.  I also do not see an indication for blood work.  I suspect this is gastritis, strongly per his clinical description.  We can start him on Pepcid in addition to omeprazole, and I will refer him back to GI, and explained that he will need to see a specialist, for possible endoscopy or further work-up, particularly if there may be a peptic  ulcer or H. pylori or another condition causing his gastritis.  We discussed dietary changes, lifestyle changes and habits, he verbalized understanding.        Final Clinical Impression(s) / ED Diagnoses Final diagnoses:  Gastritis, presence of bleeding unspecified, unspecified chronicity, unspecified gastritis type    Rx / DC Orders ED Discharge Orders          Ordered    Ambulatory referral to Gastroenterology       Comments: Recurring ED visits for reflux/gastritis - advised specialist evaluation for possible EGD/ulcer/H Pylori workup   01/09/22 0810    famotidine (PEPCID) 20 MG tablet  2 times daily        01/09/22 0810              Terald Sleeper, MD 01/09/22 [phone]

## 2022-01-14 ENCOUNTER — Encounter (HOSPITAL_COMMUNITY): Payer: Self-pay

## 2022-01-14 ENCOUNTER — Emergency Department (HOSPITAL_COMMUNITY): Payer: Self-pay

## 2022-01-14 ENCOUNTER — Other Ambulatory Visit: Payer: Self-pay

## 2022-01-14 ENCOUNTER — Emergency Department (HOSPITAL_COMMUNITY)
Admission: EM | Admit: 2022-01-14 | Discharge: 2022-01-14 | Disposition: A | Discharge status: Home / Self Care | Payer: Self-pay | Attending: Emergency Medicine | Admitting: Emergency Medicine

## 2022-01-14 DIAGNOSIS — R1013 Epigastric pain: Secondary | ICD-10-CM | POA: Insufficient documentation

## 2022-01-14 DIAGNOSIS — I10 Essential (primary) hypertension: Secondary | ICD-10-CM | POA: Insufficient documentation

## 2022-01-14 DIAGNOSIS — R112 Nausea with vomiting, unspecified: Secondary | ICD-10-CM | POA: Insufficient documentation

## 2022-01-14 LAB — URINALYSIS, ROUTINE W REFLEX MICROSCOPIC
Bilirubin Urine: NEGATIVE
Glucose, UA: NEGATIVE mg/dL
Hgb urine dipstick: NEGATIVE
Ketones, ur: NEGATIVE mg/dL
Leukocytes,Ua: NEGATIVE
Nitrite: NEGATIVE
Protein, ur: NEGATIVE mg/dL
Specific Gravity, Urine: 1.027 (ref 1.005–1.030)
pH: 6 (ref 5.0–8.0)

## 2022-01-14 LAB — CBC WITH DIFFERENTIAL/PLATELET
Abs Immature Granulocytes: 0.02 10*3/uL (ref 0.00–0.07)
Basophils Absolute: 0 10*3/uL (ref 0.0–0.1)
Basophils Relative: 1 %
Eosinophils Absolute: 0.2 10*3/uL (ref 0.0–0.5)
Eosinophils Relative: 2 %
HCT: 46.4 % (ref 39.0–52.0)
Hemoglobin: 15 g/dL (ref 13.0–17.0)
Immature Granulocytes: 0 %
Lymphocytes Relative: 43 %
Lymphs Abs: 2.8 10*3/uL (ref 0.7–4.0)
MCH: 27.6 pg (ref 26.0–34.0)
MCHC: 32.3 g/dL (ref 30.0–36.0)
MCV: 85.3 fL (ref 80.0–100.0)
Monocytes Absolute: 0.4 10*3/uL (ref 0.1–1.0)
Monocytes Relative: 6 %
Neutro Abs: 3.1 10*3/uL (ref 1.7–7.7)
Neutrophils Relative %: 48 %
Platelets: 284 10*3/uL (ref 150–400)
RBC: 5.44 MIL/uL (ref 4.22–5.81)
RDW: 13.4 % (ref 11.5–15.5)
WBC: 6.6 10*3/uL (ref 4.0–10.5)
nRBC: 0 % (ref 0.0–0.2)

## 2022-01-14 LAB — COMPREHENSIVE METABOLIC PANEL
ALT: 21 U/L (ref 0–44)
AST: 20 U/L (ref 15–41)
Albumin: 4.2 g/dL (ref 3.5–5.0)
Alkaline Phosphatase: 91 U/L (ref 38–126)
Anion gap: 4 — ABNORMAL LOW (ref 5–15)
BUN: 11 mg/dL (ref 6–20)
CO2: 26 mmol/L (ref 22–32)
Calcium: 8.9 mg/dL (ref 8.9–10.3)
Chloride: 107 mmol/L (ref 98–111)
Creatinine, Ser: 0.97 mg/dL (ref 0.61–1.24)
GFR, Estimated: >60 mL/min (ref >60)
Glucose, Bld: 94 mg/dL (ref 70–99)
Potassium: 3.6 mmol/L (ref 3.5–5.1)
Sodium: 137 mmol/L (ref 135–145)
Total Bilirubin: 0.5 mg/dL (ref 0.3–1.2)
Total Protein: 7.6 g/dL (ref 6.5–8.1)

## 2022-01-14 LAB — MAGNESIUM: Magnesium: 2.3 mg/dL (ref 1.7–2.4)

## 2022-01-14 LAB — LIPASE, BLOOD: Lipase: 27 U/L (ref 11–51)

## 2022-01-14 LAB — TROPONIN I (HIGH SENSITIVITY): Troponin I (High Sensitivity): 3 ng/L (ref <18)

## 2022-01-14 MED ORDER — FAMOTIDINE IN NACL 20-0.9 MG/50ML-% IV SOLN
20.0000 mg | Freq: Once | INTRAVENOUS | Status: AC
  Administered 2022-01-14: 20 mg via INTRAVENOUS
  Filled 2022-01-14: qty 50

## 2022-01-14 MED ORDER — IOHEXOL 300 MG/ML  SOLN
100.0000 mL | Freq: Once | INTRAMUSCULAR | Status: AC | PRN
  Administered 2022-01-14: 100 mL via INTRAVENOUS

## 2022-01-14 MED ORDER — DICYCLOMINE HCL 10 MG PO CAPS: 10.0000 mg | ORAL_CAPSULE | Freq: Once | ORAL | Status: DC

## 2022-01-14 MED ORDER — ONDANSETRON 4 MG PO TBDP
4.0000 mg | ORAL_TABLET | Freq: Three times a day (TID) | ORAL | 0 refills | Status: DC | PRN
Start: 2022-01-14 — End: 2022-10-03

## 2022-01-14 MED ORDER — LACTATED RINGERS IV BOLUS
1000.0000 mL | Freq: Once | INTRAVENOUS | Status: AC
  Administered 2022-01-14: 1000 mL via INTRAVENOUS

## 2022-01-14 MED ORDER — LIDOCAINE VISCOUS HCL 2 % MT SOLN
15.0000 mL | Freq: Once | OROMUCOSAL | Status: AC
  Administered 2022-01-14: 15 mL via ORAL
  Filled 2022-01-14: qty 15

## 2022-01-14 MED ORDER — ALUM & MAG HYDROXIDE-SIMETH 200-200-20 MG/5ML PO SUSP
30.0000 mL | Freq: Once | ORAL | Status: AC
  Administered 2022-01-14: 30 mL via ORAL
  Filled 2022-01-14: qty 30

## 2022-01-14 MED ORDER — ONDANSETRON HCL 4 MG/2ML IJ SOLN
4.0000 mg | Freq: Once | INTRAMUSCULAR | Status: AC
  Administered 2022-01-14: 4 mg via INTRAVENOUS
  Filled 2022-01-14: qty 2

## 2022-01-14 MED ORDER — SUCRALFATE 1 GM/10ML PO SUSP: 1.0000 g | Freq: Three times a day (TID) | ORAL | 0 refills | Status: DC

## 2022-01-14 NOTE — Discharge Instructions (Addendum)
Continue to take your daily omeprazole.  Some additional medications were sent to your pharmacy.  Carafate can help with stomach protection.  Take this daily with meals as prescribed.  Zofran is a medication to treat nausea.  Take this only as needed.  Continue to stay hydrated.  Avoid foods that worsen your abdominal pain and nausea.  Avoid use of tobacco, alcohol, and NSAID medications.  Follow-up with the gastroenterologist as soon as possible.  Return to the emergency department at any time for any new or worsening symptoms of concern.

## 2022-01-14 NOTE — ED Triage Notes (Signed)
Patient reports upper abdominal pain with N/V/D that started this am. States that he had same pain several days ago, it resolved and has now returned.

## 2022-01-14 NOTE — ED Provider Notes (Signed)
[facility] EMERGENCY DEPARTMENT Provider Note   CSN: [id number] Arrival date & time: 01/14/22  0915     History  Chief Complaint  Patient presents with   Abdominal Pain    Matthew Alvarez is a 31 y.o. male.   Abdominal Pain Associated symptoms: nausea and vomiting   Patient presents for abdominal pain.  Medical history includes GERD, HTN.  He was seen in the ED 4 days ago for epigastric discomfort.  He was diagnosed with gastritis and started on Pepcid in addition to his home omeprazole.  Since discharge, he reports 2 days of resolved symptoms.  He has been taking his dual acid blocking medications.  Last night, he ate some lasagna.  He subsequently had recurrence of epigastric pain.  He describes it as a throbbing sharp pain.  Pain persisted this morning.  He did go to work.  While at work, he had onset of nausea and vomiting.  His employer told him to come to the ED for evaluation.  Currently, patient endorses continued epigastric sharp pain as well as continued nausea.  He did take his omeprazole today.  He did not take any Pepcid.     Home Medications Prior to Admission medications   Medication Sig Start Date End Date Taking? Authorizing Provider  acetaminophen (TYLENOL) 500 MG tablet Take 500 mg by mouth every 6 (six) hours as needed for mild pain or moderate pain.   Yes [provider]  omeprazole (PRILOSEC) 20 MG capsule Take 2 capsules (40 mg total) by mouth daily. 12/12/21 01/14/22 Yes Kommor, Madison, MD  ondansetron (ZOFRAN-ODT) 4 MG disintegrating tablet Take 1 tablet (4 mg total) by mouth every 8 (eight) hours as needed for nausea or vomiting. 01/14/22  Yes Gloris Manchesterixon, Debborah Alonge, MD  sucralfate (CARAFATE) 1 GM/10ML suspension Take 10 mLs (1 g total) by mouth 4 (four) times daily -  with meals and at bedtime. 01/14/22  Yes Gloris Manchesterixon, Shaylen Nephew, MD  famotidine (PEPCID) 20 MG tablet Take 1 tablet (20 mg total) by mouth 2 (two) times daily. Take 30 minutes before breakfast and dinner 01/09/22  02/08/22  Terald Sleeperrifan, Matthew J, MD      Allergies    Clindamycin/lincomycin and Penicillins    Review of Systems   Review of Systems  Gastrointestinal:  Positive for abdominal pain, nausea and vomiting.  All other systems reviewed and are negative.   Physical Exam Updated Vital Signs BP 131/81 (BP Location: Right Arm)   Pulse (!) 52   Temp 98.2 F (36.8 C) (Oral)   Resp 20   Ht 5\' 9"  (1.753 m)   Wt 100.2 kg   SpO2 100%   BMI 32.62 kg/m  Physical Exam Vitals and nursing note reviewed.  Constitutional:      General: He is not in acute distress.    Appearance: He is well-developed and normal weight. He is not ill-appearing, toxic-appearing or diaphoretic.  HENT:     Head: Normocephalic and atraumatic.     Mouth/Throat:     Mouth: Mucous membranes are moist.     Pharynx: Oropharynx is clear.  Eyes:     General: No scleral icterus.    Extraocular Movements: Extraocular movements intact.     Conjunctiva/sclera: Conjunctivae normal.  Cardiovascular:     Rate and Rhythm: Normal rate and regular rhythm.     Heart sounds: No murmur heard. Pulmonary:     Effort: Pulmonary effort is normal. No respiratory distress.     Breath sounds: Normal breath sounds. No  wheezing or rales.  Abdominal:     Palpations: Abdomen is soft.     Tenderness: There is abdominal tenderness in the epigastric area. There is no guarding or rebound.  Musculoskeletal:        General: No swelling.     Cervical back: Neck supple.  Skin:    General: Skin is warm and dry.     Coloration: Skin is not cyanotic, jaundiced or pale.  Neurological:     General: No focal deficit present.     Mental Status: He is alert and oriented to person, place, and time.  Psychiatric:        Mood and Affect: Mood normal.        Behavior: Behavior normal.     ED Results / Procedures / Treatments   Labs (all labs ordered are listed, but only abnormal results are displayed) Labs Reviewed  COMPREHENSIVE METABOLIC PANEL -  Abnormal; Notable for the following components:      Result Value   Anion gap 4 (*)    All other components within normal limits  LIPASE, BLOOD  CBC WITH DIFFERENTIAL/PLATELET  URINALYSIS, ROUTINE W REFLEX MICROSCOPIC  MAGNESIUM  TROPONIN I (HIGH SENSITIVITY)    EKG EKG Interpretation  Date/Time:  Tuesday January 14 2022 09:35:53 EDT Ventricular Rate:  58 PR Interval:  195 QRS Duration: 93 QT Interval:  413 QTC Calculation: 406 R Axis:   25 Text Interpretation: Sinus rhythm ST elev, probable normal early repol pattern Confirmed by Gloris Manchester ([id number]) on 01/14/2022 10:37:09 AM  Radiology CT ABDOMEN PELVIS W CONTRAST  Result Date: 01/14/2022 CLINICAL DATA:  Nausea, vomiting and abdominal pain. Acute. Nonlocalized. EXAM: CT ABDOMEN AND PELVIS WITH CONTRAST TECHNIQUE: Multidetector CT imaging of the abdomen and pelvis was performed using the standard protocol following bolus administration of intravenous contrast. RADIATION DOSE REDUCTION: This exam was performed according to the departmental dose-optimization program which includes automated exposure control, adjustment of the mA and/or kV according to patient size and/or use of iterative reconstruction technique. CONTRAST:  OMNIPAQUE IOHEXOL 300 MG/ML  SOLN COMPARISON:  05/30/2019 FINDINGS: Lower chest: Normal Hepatobiliary: Liver parenchyma is normal.  No calcified gallstones. Pancreas: Normal Spleen: Normal Adrenals/Urinary Tract: Adrenal glands are normal. Kidneys are normal. No cyst, mass, stone or hydronephrosis. Bladder is normal. Stomach/Bowel: Stomach is normal. Small bowel is normal. Normal appendix. Normal colon. Vascular/Lymphatic: Aorta and IVC are normal.  No adenopathy. Reproductive: Normal Other: No free fluid or air. Musculoskeletal: Normal IMPRESSION: Normal CT scan of the abdomen and pelvis. No abnormality seen to explain the clinical presentation. Electronically Signed   By: Paulina Fusi M.D.   On: 01/14/2022 12:50     Procedures Procedures    Medications Ordered in ED Medications  lactated ringers bolus 1,000 mL (0 mLs Intravenous Stopped 01/14/22 1139)  ondansetron (ZOFRAN) injection 4 mg (4 mg Intravenous Given 01/14/22 0956)  alum & mag hydroxide-simeth (MAALOX/MYLANTA) 200-200-20 MG/5ML suspension 30 mL (30 mLs Oral Given 01/14/22 0954)    And  lidocaine (XYLOCAINE) 2 % viscous mouth solution 15 mL (15 mLs Oral Given 01/14/22 0954)  famotidine (PEPCID) IVPB 20 mg premix (0 mg Intravenous Stopped 01/14/22 1139)  iohexol (OMNIPAQUE) 300 MG/ML solution 100 mL (100 mLs Intravenous Contrast Given 01/14/22 1234)    ED Course/ Medical Decision Making/ A&P                           Medical Decision Making Amount and/or Complexity  of Data Reviewed Labs: ordered. Radiology: ordered.  Risk OTC drugs. Prescription drug management.   This patient presents to the ED for concern of epigastric pain, nausea, and vomiting, this involves an extensive number of treatment options, and is a complaint that carries with it a high risk of complications and morbidity.  The differential diagnosis includes gastritis, PUD, GERD, pancreatitis, cholecystitis, enteritis, ACS   Co morbidities that complicate the patient evaluation  GERD, HTN   Additional history obtained:  Additional history obtained from N/A External records from outside source obtained and reviewed including EMR   Lab Tests:  I Ordered, and personally interpreted labs.  The pertinent results include: No abnormal findings   Imaging Studies ordered:  I ordered imaging studies including CT of abdomen and pelvis I independently visualized and interpreted imaging which showed no acute findings I agree with the radiologist interpretation   Cardiac Monitoring: / EKG:  The patient was maintained on a cardiac monitor.  I personally viewed and interpreted the cardiac monitored which showed an underlying rhythm of: Sinus rhythm  Problem List / ED  Course / Critical interventions / Medication management  Patient is a healthy 31 year old male who presents for epigastric pain, nausea, and vomiting.  He had similar epigastric pain 4 days ago, at which time he was seen in the ED.  He has been taking letrozole and Pepcid at home.  He had recurrence of epigastric pain following a lasagna last night.  He had continued pain today and developed nausea and vomiting.  For this reason, he presents to the ED.  On arrival, patient is well-appearing.  His abdomen is soft.  He does endorse mild epigastric tenderness.  He has no other areas of abdominal tenderness.  Patient was given IV fluids for hydration.  GI cocktail was ordered in addition to Zofran for symptomatic relief.  Laboratory work-up was initiated.  Lab results are unremarkable.  On reassessment, patient reports improved nausea but continued epigastric discomfort.  CT scan was ordered to further evaluate.  CT scan did not show any acute findings.  On further reassessment, patient reports improved epigastric pain.  I suspect that symptoms are secondary to ongoing gastritis or reflux.  He was advised to continue his daily omeprazole.  Additionally, he was prescribed Carafate and as needed Zofran.  He has not yet scheduled an appointment with gastroenterology.  He was advised to do so when possible.  He was discharged in good condition. I ordered medication including IV fluids for hydration; GI cocktail for epigastric pain; Zofran for nausea Reevaluation of the patient after these medicines showed that the patient improved I have reviewed the patients home medicines and have made adjustments as needed   Social Determinants of Health:  Does not have PCP        Final Clinical Impression(s) / ED Diagnoses Final diagnoses:  Nausea and vomiting, unspecified vomiting type  Epigastric pain    Rx / DC Orders ED Discharge Orders          Ordered    sucralfate (CARAFATE) 1 GM/10ML suspension  3  times daily with meals & bedtime        01/14/22 1344    ondansetron (ZOFRAN-ODT) 4 MG disintegrating tablet  Every 8 hours PRN        01/14/22 1344              Gloris Manchester, MD 01/14/22 1354

## 2022-02-26 ENCOUNTER — Encounter (HOSPITAL_COMMUNITY): Payer: Self-pay | Admitting: *Deleted

## 2022-02-26 ENCOUNTER — Emergency Department (HOSPITAL_COMMUNITY): Admission: EM | Admit: 2022-02-26 | Discharge: 2022-02-26 | Discharge status: Against Medical Advice | Payer: Self-pay

## 2022-02-26 ENCOUNTER — Other Ambulatory Visit: Payer: Self-pay

## 2022-02-26 ENCOUNTER — Emergency Department (HOSPITAL_COMMUNITY)
Admission: EM | Admit: 2022-02-26 | Discharge: 2022-02-27 | Disposition: A | Discharge status: Home / Self Care | Payer: Self-pay | Attending: Emergency Medicine | Admitting: Emergency Medicine

## 2022-02-26 DIAGNOSIS — K0889 Other specified disorders of teeth and supporting structures: Secondary | ICD-10-CM | POA: Insufficient documentation

## 2022-02-26 NOTE — ED Triage Notes (Signed)
Pt with dental pain to right lower for past 3 days.  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

## 2022-02-27 MED ORDER — METRONIDAZOLE 500 MG PO TABS: 500.0000 mg | ORAL_TABLET | Freq: Three times a day (TID) | ORAL | 0 refills | Status: DC

## 2022-02-27 MED ORDER — AZITHROMYCIN 250 MG PO TABS: 250.0000 mg | ORAL_TABLET | Freq: Every day | ORAL | 0 refills | Status: DC

## 2022-02-27 NOTE — ED Notes (Signed)
Went over dc papers. All questions answered. Ambulatory to lobby .  

## 2022-02-27 NOTE — ED Provider Notes (Signed)
[facility] EMERGENCY DEPARTMENT Provider Note   CSN: [id number] Arrival date & time: 02/26/22  2132     History  Chief Complaint  Patient presents with   Dental Pain    Matthew Alvarez is a 31 y.o. male.  Patient presents to the emergency department for evaluation of dental pain.  Patient complaining of right lower jaw pain for 3 days.  No facial swelling, fever, tongue swelling, difficulty swallowing or breathing.       Home Medications Prior to Admission medications   Medication Sig Start Date End Date Taking? Authorizing Provider  azithromycin (ZITHROMAX) 250 MG tablet Take 1 tablet (250 mg total) by mouth daily. Take first 2 tablets together, then 1 every day until finished. 02/27/22  Yes Porshea Janowski, Canary Brim, MD  metroNIDAZOLE (FLAGYL) 500 MG tablet Take 1 tablet (500 mg total) by mouth 3 (three) times daily. 02/27/22  Yes Fleur Audino, Canary Brim, MD  acetaminophen (TYLENOL) 500 MG tablet Take 500 mg by mouth every 6 (six) hours as needed for mild pain or moderate pain.    [provider]  famotidine (PEPCID) 20 MG tablet Take 1 tablet (20 mg total) by mouth 2 (two) times daily. Take 30 minutes before breakfast and dinner 01/09/22 02/08/22  Terald Sleeper, MD  omeprazole (PRILOSEC) 20 MG capsule Take 2 capsules (40 mg total) by mouth daily. 12/12/21 01/14/22  Kommor, Madison, MD  ondansetron (ZOFRAN-ODT) 4 MG disintegrating tablet Take 1 tablet (4 mg total) by mouth every 8 (eight) hours as needed for nausea or vomiting. 01/14/22   Gloris Manchester, MD  sucralfate (CARAFATE) 1 GM/10ML suspension Take 10 mLs (1 g total) by mouth 4 (four) times daily -  with meals and at bedtime. 01/14/22   Gloris Manchester, MD      Allergies    Clindamycin/lincomycin and Penicillins    Review of Systems   Review of Systems  Physical Exam Updated Vital Signs BP 129/71 (BP Location: Right Arm)   Pulse (!) 52   Temp 98.3 F (36.8 C) (Oral)   Resp 16   Ht 5\' 9"  (1.753 m)   Wt 96.2 kg   SpO2  98%   BMI 31.31 kg/m  Physical Exam Vitals and nursing note reviewed.  Constitutional:      General: He is not in acute distress.    Appearance: He is well-developed.  HENT:     Head: Normocephalic and atraumatic.     Mouth/Throat:     Mouth: Mucous membranes are moist.   Eyes:     General: Vision grossly intact. Gaze aligned appropriately.     Extraocular Movements: Extraocular movements intact.     Conjunctiva/sclera: Conjunctivae normal.  Cardiovascular:     Rate and Rhythm: Normal rate and regular rhythm.     Pulses: Normal pulses.     Heart sounds: Normal heart sounds, S1 normal and S2 normal. No murmur heard.    No friction rub. No gallop.  Pulmonary:     Effort: Pulmonary effort is normal. No respiratory distress.     Breath sounds: Normal breath sounds.  Abdominal:     Palpations: Abdomen is soft.     Tenderness: There is no abdominal tenderness. There is no guarding or rebound.     Hernia: No hernia is present.  Musculoskeletal:        General: No swelling.     Cervical back: Full passive range of motion without pain, normal range of motion and neck supple. No pain with movement,  spinous process tenderness or muscular tenderness. Normal range of motion.     Right lower leg: No edema.     Left lower leg: No edema.  Skin:    General: Skin is warm and dry.     Capillary Refill: Capillary refill takes less than 2 seconds.     Findings: No ecchymosis, erythema, lesion or wound.  Neurological:     Mental Status: He is alert and oriented to person, place, and time.     GCS: GCS eye subscore is 4. GCS verbal subscore is 5. GCS motor subscore is 6.     Cranial Nerves: Cranial nerves 2-12 are intact.     Sensory: Sensation is intact.     Motor: Motor function is intact. No weakness or abnormal muscle tone.     Coordination: Coordination is intact.  Psychiatric:        Mood and Affect: Mood normal.        Speech: Speech normal.        Behavior: Behavior normal.      ED Results / Procedures / Treatments   Labs (all labs ordered are listed, but only abnormal results are displayed) Labs Reviewed - No data to display  EKG None  Radiology No results found.  Procedures Procedures    Medications Ordered in ED Medications - No data to display  ED Course/ Medical Decision Making/ A&P                           Medical Decision Making  Dental pain without signs of dental abscess.        Final Clinical Impression(s) / ED Diagnoses Final diagnoses:  Pain, dental    Rx / DC Orders ED Discharge Orders          Ordered    azithromycin (ZITHROMAX) 250 MG tablet  Daily        02/27/22 0018    metroNIDAZOLE (FLAGYL) 500 MG tablet  3 times daily        02/27/22 0018              Gilda Crease, MD 02/27/22 [phone]

## 2022-03-18 ENCOUNTER — Emergency Department (HOSPITAL_COMMUNITY)
Admission: EM | Admit: 2022-03-18 | Discharge: 2022-03-18 | Disposition: A | Discharge status: Home / Self Care | Payer: Self-pay | Attending: Emergency Medicine | Admitting: Emergency Medicine

## 2022-03-18 ENCOUNTER — Encounter (HOSPITAL_COMMUNITY): Payer: Self-pay | Admitting: Emergency Medicine

## 2022-03-18 ENCOUNTER — Other Ambulatory Visit: Payer: Self-pay

## 2022-03-18 DIAGNOSIS — K029 Dental caries, unspecified: Secondary | ICD-10-CM | POA: Insufficient documentation

## 2022-03-18 DIAGNOSIS — K0889 Other specified disorders of teeth and supporting structures: Secondary | ICD-10-CM

## 2022-03-18 MED ORDER — METRONIDAZOLE 500 MG PO TABS: 500.0000 mg | ORAL_TABLET | Freq: Three times a day (TID) | ORAL | 0 refills | Status: DC

## 2022-03-18 NOTE — ED Triage Notes (Signed)
  Patient comes in for dental pain due to cracked tooth.  Patient states he has had the cracked tooth for several weeks but does not have a dentist.  Patient requests help for pain control until he can follow up with dentist.  Pain 10/10, throbbing.

## 2022-03-18 NOTE — Discharge Instructions (Signed)
Dental pain needs to be treated by a dentist.  We do not have a dentist here.

## 2022-03-18 NOTE — ED Provider Notes (Signed)
[facility] EMERGENCY DEPARTMENT Provider Note   CSN: [id number] Arrival date & time: 03/18/22  2220     History  Chief Complaint  Patient presents with   Dental Pain    Matthew Alvarez is a 31 y.o. male.  Patient presents to the emergency department for evaluation of dental pain.  Patient reports that he has had problems with this tooth in the past, there is a large cavity.  Patient reports that it started hurting again in the last day or so.       Home Medications Prior to Admission medications   Medication Sig Start Date End Date Taking? Authorizing Provider  acetaminophen (TYLENOL) 500 MG tablet Take 500 mg by mouth every 6 (six) hours as needed for mild pain or moderate pain.    [provider]  famotidine (PEPCID) 20 MG tablet Take 1 tablet (20 mg total) by mouth 2 (two) times daily. Take 30 minutes before breakfast and dinner 01/09/22 02/08/22  Wyvonnia Dusky, MD  metroNIDAZOLE (FLAGYL) 500 MG tablet Take 1 tablet (500 mg total) by mouth 3 (three) times daily. 03/18/22   Orpah Greek, MD  omeprazole (PRILOSEC) 20 MG capsule Take 2 capsules (40 mg total) by mouth daily. 12/12/21 01/14/22  Kommor, Madison, MD  ondansetron (ZOFRAN-ODT) 4 MG disintegrating tablet Take 1 tablet (4 mg total) by mouth every 8 (eight) hours as needed for nausea or vomiting. 01/14/22   Godfrey Pick, MD  sucralfate (CARAFATE) 1 GM/10ML suspension Take 10 mLs (1 g total) by mouth 4 (four) times daily -  with meals and at bedtime. 01/14/22   Godfrey Pick, MD      Allergies    Clindamycin/lincomycin and Penicillins    Review of Systems   Review of Systems  Physical Exam Updated Vital Signs BP 136/84 (BP Location: Right Arm)   Pulse 60   Temp 98.1 F (36.7 C) (Oral)   Resp 16   Ht 5\' 9"  (1.753 m)   Wt 96.2 kg   SpO2 97%   BMI 31.31 kg/m  Physical Exam Vitals and nursing note reviewed.  Constitutional:      Appearance: Normal appearance.  HENT:     Head: Normocephalic and  atraumatic.     Mouth/Throat:     Dentition: Dental tenderness and dental caries present. No dental abscesses.   Skin:    General: Skin is cool.     Findings: No erythema.  Neurological:     Mental Status: He is alert.     Cranial Nerves: Cranial nerves 2-12 are intact.     Sensory: Sensation is intact.     Motor: Motor function is intact.     ED Results / Procedures / Treatments   Labs (all labs ordered are listed, but only abnormal results are displayed) Labs Reviewed - No data to display  EKG None  Radiology No results found.  Procedures Procedures    Medications Ordered in ED Medications - No data to display  ED Course/ Medical Decision Making/ A&P                           Medical Decision Making  Presents with dental pain, no sign of dental abscess.  Patient counseled that he needs to see a dentist.        Final Clinical Impression(s) / ED Diagnoses Final diagnoses:  Pain, dental    Rx / DC Orders ED Discharge Orders  Ordered    metroNIDAZOLE (FLAGYL) 500 MG tablet  3 times daily        03/18/22 2329              Orpah Greek, MD 03/18/22 2329

## 2022-04-26 ENCOUNTER — Other Ambulatory Visit: Payer: Self-pay

## 2022-04-26 ENCOUNTER — Encounter (HOSPITAL_COMMUNITY): Payer: Self-pay | Admitting: Emergency Medicine

## 2022-04-26 ENCOUNTER — Emergency Department (HOSPITAL_COMMUNITY)
Admission: EM | Admit: 2022-04-26 | Discharge: 2022-04-26 | Disposition: A | Discharge status: Home / Self Care | Payer: Self-pay | Attending: Emergency Medicine | Admitting: Emergency Medicine

## 2022-04-26 DIAGNOSIS — J029 Acute pharyngitis, unspecified: Secondary | ICD-10-CM

## 2022-04-26 DIAGNOSIS — R0981 Nasal congestion: Secondary | ICD-10-CM | POA: Insufficient documentation

## 2022-04-26 DIAGNOSIS — Z20822 Contact with and (suspected) exposure to covid-19: Secondary | ICD-10-CM | POA: Insufficient documentation

## 2022-04-26 LAB — GROUP A STREP BY PCR: Group A Strep by PCR: NOT DETECTED

## 2022-04-26 LAB — RESP PANEL BY RT-PCR (FLU A&B, COVID) ARPGX2
Influenza A by PCR: NEGATIVE
Influenza B by PCR: NEGATIVE
SARS Coronavirus 2 by RT PCR: NEGATIVE

## 2022-04-26 MED ORDER — DEXAMETHASONE SODIUM PHOSPHATE 10 MG/ML IJ SOLN
8.0000 mg | Freq: Once | INTRAMUSCULAR | Status: AC
  Administered 2022-04-26: 8 mg via INTRAMUSCULAR
  Filled 2022-04-26: qty 1

## 2022-04-26 MED ORDER — KETOROLAC TROMETHAMINE 60 MG/2ML IM SOLN
60.0000 mg | Freq: Once | INTRAMUSCULAR | Status: AC
  Administered 2022-04-26: 60 mg via INTRAMUSCULAR
  Filled 2022-04-26: qty 2

## 2022-04-26 NOTE — ED Triage Notes (Signed)
Pt via POV c/o sore throat and a light cough since yesterday. He felt like he had a lot of sinus drainage and he thinks that has made his throat irritated. He has taken claritin, nyquil, and cough drops.

## 2022-04-26 NOTE — ED Provider Notes (Signed)
[facility] EMERGENCY DEPARTMENT Provider Note   CSN: [id number] Arrival date & time: 04/26/22  0732     History  Chief Complaint  Patient presents with   Sore Throat    Matthew Alvarez is a 31 y.o. male presented to ED with sore throat and congestion.  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  HPI     Home Medications Prior to Admission medications   Medication Sig Start Date End Date Taking? Authorizing Provider  acetaminophen (TYLENOL) 500 MG tablet Take 500 mg by mouth every 6 (six) hours as needed for mild pain or moderate pain.    [provider]  famotidine (PEPCID) 20 MG tablet Take 1 tablet (20 mg total) by mouth 2 (two) times daily. Take 30 minutes before breakfast and dinner 01/09/22 02/08/22  Terald Sleeper, MD  metroNIDAZOLE (FLAGYL) 500 MG tablet Take 1 tablet (500 mg total) by mouth 3 (three) times daily. 03/18/22   Gilda Crease, MD  omeprazole (PRILOSEC) 20 MG capsule Take 2 capsules (40 mg total) by mouth daily. 12/12/21 01/14/22  Kommor, Madison, MD  ondansetron (ZOFRAN-ODT) 4 MG disintegrating tablet Take 1 tablet (4 mg total) by mouth every 8 (eight) hours as needed for nausea or vomiting. 01/14/22   Gloris Manchester, MD  sucralfate (CARAFATE) 1 GM/10ML suspension Take 10 mLs (1 g total) by mouth 4 (four) times daily -  with meals and at bedtime. 01/14/22   Gloris Manchester, MD      Allergies    Clindamycin/lincomycin and Penicillins    Review of Systems   Review of Systems  Physical Exam Updated Vital Signs BP 130/84 (BP Location: Left Arm)   Pulse 60   Temp 98 F (36.7 C) (Oral)   Resp 14   Ht 5\' 9"  (1.753 m)   Wt 99.8 kg   SpO2 97%   BMI 32.49 kg/m  Physical Exam Constitutional:      General: He is not in acute distress. HENT:     Head: Normocephalic and atraumatic.  Eyes:     Conjunctiva/sclera: Conjunctivae normal.      Pupils: Pupils are equal, round, and reactive to light.  Cardiovascular:     Rate and Rhythm: Normal rate and regular rhythm.  Pulmonary:     Effort: Pulmonary effort is normal. No respiratory distress.  Abdominal:     General: There is no distension.     Tenderness: There is no abdominal tenderness.  Skin:    General: Skin is warm and dry.  Neurological:     General: No focal deficit present.     Mental Status: He is alert. Mental status is at baseline.  Psychiatric:        Mood and Affect: Mood normal.        Behavior: Behavior normal.     ED Results / Procedures / Treatments   Labs (all labs ordered are listed, but only abnormal results are displayed) Labs Reviewed  GROUP A STREP BY PCR  RESP PANEL BY RT-PCR (FLU A&B, COVID) [id number]    EKG None  Radiology No results found.  Procedures Procedures    Medications Ordered in ED Medications  dexamethasone (DECADRON) injection 8 mg (8 mg Intramuscular Given 04/26/22 0831)  ketorolac (TORADOL) injection 60 mg (60 mg Intramuscular Given 04/26/22 0831)    ED Course/ Medical Decision Making/ A&P  Medical Decision Making Risk Prescription drug management.   Patient is here with sore throat and sinus congestion, which may be irritation from sinus drainage versus strep pharyngitis versus other.  He has no other signs or symptoms of viral URI at this time.  Patient was given Decadron and Toradol for pain as intramuscular injections.  His strep test was negative  There is no indication for antibiotics at this time.  I felt he is reasonably stable for outpatient follow-up.        Final Clinical Impression(s) / ED Diagnoses Final diagnoses:  Pharyngitis, unspecified etiology    Rx / DC Orders ED Discharge Orders     None         Willmer Fellers, Kermit Balo, MD 04/26/22 [phone]

## 2022-04-26 NOTE — Discharge Instructions (Addendum)
Your strep throat test was negative.  Your COVID and flu test should result by noon today.  Log onto your MyChart to find your results.

## 2022-05-02 ENCOUNTER — Encounter: Payer: Self-pay | Admitting: Internal Medicine

## 2022-05-18 DIAGNOSIS — R739 Hyperglycemia, unspecified: Secondary | ICD-10-CM | POA: Insufficient documentation

## 2022-05-18 DIAGNOSIS — I10 Essential (primary) hypertension: Secondary | ICD-10-CM | POA: Insufficient documentation

## 2022-05-18 DIAGNOSIS — R1013 Epigastric pain: Secondary | ICD-10-CM | POA: Insufficient documentation

## 2022-05-18 DIAGNOSIS — R7401 Elevation of levels of liver transaminase levels: Secondary | ICD-10-CM | POA: Insufficient documentation

## 2022-05-19 ENCOUNTER — Encounter (HOSPITAL_COMMUNITY): Payer: Self-pay | Admitting: Emergency Medicine

## 2022-05-19 ENCOUNTER — Emergency Department (HOSPITAL_COMMUNITY)
Admission: EM | Admit: 2022-05-19 | Discharge: 2022-05-19 | Disposition: A | Discharge status: Home / Self Care | Payer: Self-pay | Attending: Emergency Medicine | Admitting: Emergency Medicine

## 2022-05-19 ENCOUNTER — Other Ambulatory Visit: Payer: Self-pay

## 2022-05-19 DIAGNOSIS — R7401 Elevation of levels of liver transaminase levels: Secondary | ICD-10-CM

## 2022-05-19 DIAGNOSIS — R7309 Other abnormal glucose: Secondary | ICD-10-CM

## 2022-05-19 DIAGNOSIS — R1013 Epigastric pain: Secondary | ICD-10-CM

## 2022-05-19 LAB — COMPREHENSIVE METABOLIC PANEL
ALT: 45 U/L — ABNORMAL HIGH (ref 0–44)
AST: 36 U/L (ref 15–41)
Albumin: 3.6 g/dL (ref 3.5–5.0)
Alkaline Phosphatase: 99 U/L (ref 38–126)
Anion gap: 6 (ref 5–15)
BUN: 13 mg/dL (ref 6–20)
CO2: 27 mmol/L (ref 22–32)
Calcium: 8.7 mg/dL — ABNORMAL LOW (ref 8.9–10.3)
Chloride: 105 mmol/L (ref 98–111)
Creatinine, Ser: 0.86 mg/dL (ref 0.61–1.24)
GFR, Estimated: >60 mL/min (ref >60)
Glucose, Bld: 103 mg/dL — ABNORMAL HIGH (ref 70–99)
Potassium: 3.5 mmol/L (ref 3.5–5.1)
Sodium: 138 mmol/L (ref 135–145)
Total Bilirubin: 0.5 mg/dL (ref 0.3–1.2)
Total Protein: 7.2 g/dL (ref 6.5–8.1)

## 2022-05-19 LAB — CBC
HCT: 42.8 % (ref 39.0–52.0)
Hemoglobin: 14 g/dL (ref 13.0–17.0)
MCH: 27.5 pg (ref 26.0–34.0)
MCHC: 32.7 g/dL (ref 30.0–36.0)
MCV: 84.1 fL (ref 80.0–100.0)
Platelets: 299 10*3/uL (ref 150–400)
RBC: 5.09 MIL/uL (ref 4.22–5.81)
RDW: 13.6 % (ref 11.5–15.5)
WBC: 9.8 10*3/uL (ref 4.0–10.5)
nRBC: 0 % (ref 0.0–0.2)

## 2022-05-19 LAB — LIPASE, BLOOD: Lipase: 28 U/L (ref 11–51)

## 2022-05-19 LAB — URINALYSIS, ROUTINE W REFLEX MICROSCOPIC
Bilirubin Urine: NEGATIVE
Glucose, UA: NEGATIVE mg/dL
Hgb urine dipstick: NEGATIVE
Ketones, ur: NEGATIVE mg/dL
Leukocytes,Ua: NEGATIVE
Nitrite: NEGATIVE
Protein, ur: NEGATIVE mg/dL
Specific Gravity, Urine: 1.027 (ref 1.005–1.030)
pH: 6 (ref 5.0–8.0)

## 2022-05-19 NOTE — Discharge Instructions (Signed)
Take omeprazole (Prilosec OTC) once a day.  Inferior pain is not responding to that, you may increase omeprazole to twice a day for 2-3 weeks.  You may take antacids like Maalox, Mylanta, Pepto-Bismol as needed.

## 2022-05-19 NOTE — ED Triage Notes (Signed)
Pt with c/o abdominal pain that started yesterday after eating lasagna. Pt also states he vomited twice yesterday.

## 2022-05-19 NOTE — ED Provider Notes (Signed)
[facility] EMERGENCY DEPARTMENT Provider Note   CSN: [id number] Arrival date & time: 05/18/22  2341     History  Chief Complaint  Patient presents with   Abdominal Pain    Matthew Alvarez is a 31 y.o. male.  The history is provided by the patient.  Abdominal Pain He has history of hypertension, GERD and comes in because upper abdominal pain and vomiting.  Pain started 2 days ago after eating some lasagna.  He did vomit twice.  Pain has subsided substantially but is still present.  He has had similar problems in the past, but not this severe.  Of note, he does take omeprazole, but it sounds like it is not on a consistent basis.  He denies tobacco use.  States he only has rather minimal ethanol ingestion.  He denies NSAID use.   Home Medications Prior to Admission medications   Medication Sig Start Date End Date Taking? Authorizing Provider  acetaminophen (TYLENOL) 500 MG tablet Take 500 mg by mouth every 6 (six) hours as needed for mild pain or moderate pain.    [provider]  famotidine (PEPCID) 20 MG tablet Take 1 tablet (20 mg total) by mouth 2 (two) times daily. Take 30 minutes before breakfast and dinner 01/09/22 02/08/22  Terald Sleeper, MD  metroNIDAZOLE (FLAGYL) 500 MG tablet Take 1 tablet (500 mg total) by mouth 3 (three) times daily. 03/18/22   Gilda Crease, MD  omeprazole (PRILOSEC) 20 MG capsule Take 2 capsules (40 mg total) by mouth daily. 12/12/21 01/14/22  Kommor, Madison, MD  ondansetron (ZOFRAN-ODT) 4 MG disintegrating tablet Take 1 tablet (4 mg total) by mouth every 8 (eight) hours as needed for nausea or vomiting. 01/14/22   Gloris Manchester, MD  sucralfate (CARAFATE) 1 GM/10ML suspension Take 10 mLs (1 g total) by mouth 4 (four) times daily -  with meals and at bedtime. 01/14/22   Gloris Manchester, MD      Allergies    Clindamycin/lincomycin and Penicillins    Review of Systems   Review of Systems  Gastrointestinal:  Positive for abdominal pain.  All other  systems reviewed and are negative.   Physical Exam Updated Vital Signs BP (!) 140/76 (BP Location: Right Arm)   Pulse (!) 55   Temp 98 F (36.7 C) (Oral)   Resp 18   Ht 5\' 9"  (1.753 m)   Wt 99.8 kg   SpO2 96%   BMI 32.49 kg/m  Physical Exam Vitals and nursing note reviewed.   31 year old male, resting comfortably and in no acute distress. Vital signs are significant for borderline elevated blood pressure and slightly slow heart rate. Oxygen saturation is 96%, which is normal. Head is normocephalic and atraumatic. PERRLA, EOMI. Oropharynx is clear. Neck is nontender and supple without adenopathy or JVD. Back is nontender and there is no CVA tenderness. Lungs are clear without rales, wheezes, or rhonchi. Chest is nontender. Heart has regular rate and rhythm without murmur. Abdomen is soft, flat, nontender without masses or hepatosplenomegaly and peristalsis is normoactive. Extremities have no cyanosis or edema, full range of motion is present. Skin is warm and dry without rash. Neurologic: Mental status is normal, cranial nerves are intact, there are no motor or sensory deficits.  ED Results / Procedures / Treatments   Labs (all labs ordered are listed, but only abnormal results are displayed) Labs Reviewed  COMPREHENSIVE METABOLIC PANEL - Abnormal; Notable for the following components:      Result  Value   Glucose, Bld 103 (*)    Calcium 8.7 (*)    ALT 45 (*)    All other components within normal limits  LIPASE, BLOOD  CBC  URINALYSIS, ROUTINE W REFLEX MICROSCOPIC    EKG None  Radiology No results found.  Procedures Procedures    Medications Ordered in ED Medications - No data to display  ED Course/ Medical Decision Making/ A&P                           Medical Decision Making Amount and/or Complexity of Data Reviewed Labs: ordered.   Epigastric pain with vomiting.  Possible peptic ulcer disease, possible GERD, possible viral gastritis.  I have reviewed  and interpreted his laboratory test, and my interpretation is minimal elevation of random glucose and minimal elevation of AST of uncertain clinical significance.  Patient is generally doing well and I feel he is safe for discharge.  I have advised him to make sure that he takes omeprazole every day and told him he can increase it to twice a day for several weeks if symptoms or not being adequately controlled.  I have referred him to gastroenterology for follow-up.  Final Clinical Impression(s) / ED Diagnoses Final diagnoses:  Epigastric pain  Elevated random blood glucose level  Elevated AST (SGOT)    Rx / DC Orders ED Discharge Orders     None         Dione Booze, MD 05/19/22 [phone]

## 2022-05-31 ENCOUNTER — Encounter (HOSPITAL_COMMUNITY): Payer: Self-pay

## 2022-05-31 ENCOUNTER — Other Ambulatory Visit: Payer: Self-pay

## 2022-05-31 ENCOUNTER — Emergency Department (HOSPITAL_COMMUNITY)
Admission: EM | Admit: 2022-05-31 | Discharge: 2022-05-31 | Disposition: A | Discharge status: Home / Self Care | Payer: Self-pay | Attending: Emergency Medicine | Admitting: Emergency Medicine

## 2022-05-31 DIAGNOSIS — Z20822 Contact with and (suspected) exposure to covid-19: Secondary | ICD-10-CM | POA: Insufficient documentation

## 2022-05-31 DIAGNOSIS — B349 Viral infection, unspecified: Secondary | ICD-10-CM | POA: Insufficient documentation

## 2022-05-31 DIAGNOSIS — R519 Headache, unspecified: Secondary | ICD-10-CM

## 2022-05-31 LAB — RESP PANEL BY RT-PCR (RSV, FLU A&B, COVID)  RVPGX2
Influenza A by PCR: NEGATIVE
Influenza B by PCR: NEGATIVE
Resp Syncytial Virus by PCR: NEGATIVE
SARS Coronavirus 2 by RT PCR: NEGATIVE

## 2022-05-31 MED ORDER — KETOROLAC TROMETHAMINE 30 MG/ML IJ SOLN
15.0000 mg | Freq: Once | INTRAMUSCULAR | Status: AC
  Administered 2022-05-31: 15 mg via INTRAVENOUS
  Filled 2022-05-31: qty 1

## 2022-05-31 MED ORDER — SODIUM CHLORIDE 0.9 % IV BOLUS
1000.0000 mL | Freq: Once | INTRAVENOUS | Status: AC
  Administered 2022-05-31: 1000 mL via INTRAVENOUS

## 2022-05-31 NOTE — ED Triage Notes (Signed)
"  My son tested positive for covid 2 days ago, last night I began coughing and this morning I woke with a headache and my body just feels really weak" per pt

## 2022-05-31 NOTE — ED Provider Notes (Signed)
[facility] EMERGENCY DEPARTMENT Provider Note   CSN: [id number] Arrival date & time: 05/31/22  1046     History  Chief Complaint  Patient presents with   Headache    Matthew Alvarez is a 31 y.o. male.  HPI Presents with concern of headache, cough, myalgia.  The patient's son tested positive for COVID 2 days ago.  He is generally well himself, does not smoke.    Home Medications Prior to Admission medications   Medication Sig Start Date End Date Taking? Authorizing Provider  acetaminophen (TYLENOL) 500 MG tablet Take 500 mg by mouth every 6 (six) hours as needed for mild pain or moderate pain.    [provider]  famotidine (PEPCID) 20 MG tablet Take 1 tablet (20 mg total) by mouth 2 (two) times daily. Take 30 minutes before breakfast and dinner 01/09/22 02/08/22  Terald Sleeper, MD  metroNIDAZOLE (FLAGYL) 500 MG tablet Take 1 tablet (500 mg total) by mouth 3 (three) times daily. 03/18/22   Gilda Crease, MD  omeprazole (PRILOSEC) 20 MG capsule Take 2 capsules (40 mg total) by mouth daily. 12/12/21 01/14/22  Kommor, Madison, MD  ondansetron (ZOFRAN-ODT) 4 MG disintegrating tablet Take 1 tablet (4 mg total) by mouth every 8 (eight) hours as needed for nausea or vomiting. 01/14/22   Gloris Manchester, MD  sucralfate (CARAFATE) 1 GM/10ML suspension Take 10 mLs (1 g total) by mouth 4 (four) times daily -  with meals and at bedtime. 01/14/22   Gloris Manchester, MD      Allergies    Clindamycin/lincomycin and Penicillins    Review of Systems   Review of Systems  All other systems reviewed and are negative.   Physical Exam Updated Vital Signs BP 129/62   Pulse 88   Temp 99 F (37.2 C)   Resp 17   Ht 5\' 9"  (1.753 m)   Wt 99.8 kg   SpO2 98%   BMI 32.49 kg/m  Physical Exam Vitals and nursing note reviewed.  Constitutional:      General: He is not in acute distress.    Appearance: He is well-developed.  HENT:     Head: Normocephalic and atraumatic.  Eyes:      Conjunctiva/sclera: Conjunctivae normal.  Cardiovascular:     Rate and Rhythm: Normal rate and regular rhythm.  Pulmonary:     Effort: Pulmonary effort is normal. No respiratory distress.     Breath sounds: No stridor.  Abdominal:     General: There is no distension.  Skin:    General: Skin is warm and dry.  Neurological:     Mental Status: He is alert and oriented to person, place, and time.     Cranial Nerves: No cranial nerve deficit or dysarthria.     Motor: No weakness.     ED Results / Procedures / Treatments   Labs (all labs ordered are listed, but only abnormal results are displayed) Labs Reviewed  RESP PANEL BY RT-PCR (RSV, FLU A&B, COVID)  RVPGX2    EKG None  Radiology No results found.  Procedures Procedures    Medications Ordered in ED Medications  sodium chloride 0.9 % bolus 1,000 mL (1,000 mLs Intravenous New Bag/Given 05/31/22 1333)  ketorolac (TORADOL) 30 MG/ML injection 15 mg (15 mg Intravenous Given 05/31/22 1332)    ED Course/ Medical Decision Making/ A&P  Medical Decision Making Patient with no medical problems presents with 2 days of symptoms including headache, cough, myalgia.  Constellation consistent with infectious etiology, mild dehydration is a possibility as a sepsis or bacteremia, labs fluids started  Amount and/or Complexity of Data Reviewed Labs:  Decision-making details documented in ED Course.  Risk Prescription drug management. Decision regarding hospitalization.   2:36 PM Patient awake, alert, ambulatory.  Labs reviewed, negative for RSV, influenza and COVID.  However, some suspicion for early testing of the patient's son is positive, the patient's symptoms are consistent with a viral process.  No hemodynamic instability, patient is in no distress, symptoms have improved with fluids and Toradol, patient will follow-up with primary care.        Final Clinical Impression(s) / ED Diagnoses Final  diagnoses:  Bad headache  Viral syndrome    Rx / DC Orders ED Discharge Orders     None         Carmin Muskrat, MD 05/31/22 1436

## 2022-05-31 NOTE — Discharge Instructions (Signed)
As discussed, your evaluation today has been largely reassuring.  Please follow-up with your physician for appropriate ongoing care.  You are symptoms are likely due to a viral infection, possibly COVID.  It is not uncommon for tests to be negative until symptoms are more pronounced.  Please stay well-hydrated, monitor your condition carefully and do not hesitate to return here for concerning changes in your condition.

## 2022-06-03 ENCOUNTER — Other Ambulatory Visit: Payer: Self-pay

## 2022-06-03 ENCOUNTER — Encounter (HOSPITAL_COMMUNITY): Payer: Self-pay

## 2022-06-03 ENCOUNTER — Emergency Department (HOSPITAL_COMMUNITY)
Admission: EM | Admit: 2022-06-03 | Discharge: 2022-06-03 | Disposition: A | Discharge status: Home / Self Care | Payer: Self-pay | Attending: Emergency Medicine | Admitting: Emergency Medicine

## 2022-06-03 DIAGNOSIS — Z1152 Encounter for screening for COVID-19: Secondary | ICD-10-CM | POA: Insufficient documentation

## 2022-06-03 DIAGNOSIS — J101 Influenza due to other identified influenza virus with other respiratory manifestations: Secondary | ICD-10-CM | POA: Insufficient documentation

## 2022-06-03 LAB — RESP PANEL BY RT-PCR (RSV, FLU A&B, COVID)  RVPGX2
Influenza A by PCR: NEGATIVE
Influenza B by PCR: POSITIVE — AB
Resp Syncytial Virus by PCR: NEGATIVE
SARS Coronavirus 2 by RT PCR: NEGATIVE

## 2022-06-03 LAB — GROUP A STREP BY PCR: Group A Strep by PCR: NOT DETECTED

## 2022-06-03 MED ORDER — OSELTAMIVIR PHOSPHATE 75 MG PO CAPS: 75.0000 mg | ORAL_CAPSULE | Freq: Two times a day (BID) | ORAL | 0 refills | Status: DC

## 2022-06-03 NOTE — ED Provider Notes (Signed)
[facility] EMERGENCY DEPARTMENT Provider Note   CSN: [id number] Arrival date & time: 06/03/22  6734     History  Chief Complaint  Patient presents with   Fever    Matthew Alvarez is a 31 y.o. male.  Patient is a 31 year old male with history of stomach ulcers.  Patient presenting today for evaluation of URI symptoms.  He describes body aches, chills, cough, and congestion for the past 3 days.  He was seen here 2 days ago and had a negative COVID and flu swab, but returns today stating he is no better.  He reports his son testing positive for COVID at home.  Patient denies any chest pain or difficulty breathing.  There are no aggravating or alleviating factors.  The history is provided by the patient.       Home Medications Prior to Admission medications   Medication Sig Start Date End Date Taking? Authorizing Provider  acetaminophen (TYLENOL) 500 MG tablet Take 500 mg by mouth every 6 (six) hours as needed for mild pain or moderate pain.    [provider]  famotidine (PEPCID) 20 MG tablet Take 1 tablet (20 mg total) by mouth 2 (two) times daily. Take 30 minutes before breakfast and dinner 01/09/22 02/08/22  Terald Sleeper, MD  metroNIDAZOLE (FLAGYL) 500 MG tablet Take 1 tablet (500 mg total) by mouth 3 (three) times daily. 03/18/22   Gilda Crease, MD  omeprazole (PRILOSEC) 20 MG capsule Take 2 capsules (40 mg total) by mouth daily. 12/12/21 01/14/22  Kommor, Madison, MD  ondansetron (ZOFRAN-ODT) 4 MG disintegrating tablet Take 1 tablet (4 mg total) by mouth every 8 (eight) hours as needed for nausea or vomiting. 01/14/22   Gloris Manchester, MD  sucralfate (CARAFATE) 1 GM/10ML suspension Take 10 mLs (1 g total) by mouth 4 (four) times daily -  with meals and at bedtime. 01/14/22   Gloris Manchester, MD      Allergies    Clindamycin/lincomycin and Penicillins    Review of Systems   Review of Systems  All other systems reviewed and are negative.   Physical Exam Updated  Vital Signs BP 131/67   Pulse 86   Temp (!) 100.7 F (38.2 C) (Oral)   Resp 18   SpO2 100%  Physical Exam Vitals and nursing note reviewed.  Constitutional:      General: He is not in acute distress.    Appearance: He is well-developed. He is not diaphoretic.  HENT:     Head: Normocephalic and atraumatic.  Cardiovascular:     Rate and Rhythm: Normal rate and regular rhythm.     Heart sounds: No murmur heard.    No friction rub.  Pulmonary:     Effort: Pulmonary effort is normal. No respiratory distress.     Breath sounds: Normal breath sounds. No wheezing or rales.  Abdominal:     General: Bowel sounds are normal. There is no distension.     Palpations: Abdomen is soft.     Tenderness: There is no abdominal tenderness.  Musculoskeletal:        General: Normal range of motion.     Cervical back: Normal range of motion and neck supple.  Skin:    General: Skin is warm and dry.  Neurological:     Mental Status: He is alert and oriented to person, place, and time.     Coordination: Coordination normal.     ED Results / Procedures / Treatments   Labs (all  labs ordered are listed, but only abnormal results are displayed) Labs Reviewed  RESP PANEL BY RT-PCR (RSV, FLU A&B, COVID)  RVPGX2 - Abnormal; Notable for the following components:      Result Value   Influenza B by PCR POSITIVE (*)    All other components within normal limits  GROUP A STREP BY PCR    EKG None  Radiology No results found.  Procedures Procedures    Medications Ordered in ED Medications - No data to display  ED Course/ Medical Decision Making/ A&P  Influenza B test today is positive.  Patient to be treated with Tamiflu and continued use of over-the-counter medications.  To follow-up as needed if not improving.  Final Clinical Impression(s) / ED Diagnoses Final diagnoses:  None    Rx / DC Orders ED Discharge Orders     None         Geoffery Lyons, MD 06/03/22 [phone]

## 2022-06-03 NOTE — Discharge Instructions (Signed)
Begin taking Tamiflu as prescribed.  Take over-the-counter medications as needed for relief of symptoms.  Drink plenty of fluids and get plenty of rest.

## 2022-06-03 NOTE — ED Triage Notes (Signed)
Pt seen here other day for covid symptoms and tested negative for RSV, Covid, Flu. States he has not gotten better. Having sore throat, fever, weakness, body aches.

## 2022-09-02 ENCOUNTER — Emergency Department (HOSPITAL_COMMUNITY)
Admission: EM | Admit: 2022-09-02 | Discharge: 2022-09-02 | Disposition: A | Discharge status: Home / Self Care | Payer: Self-pay | Attending: Emergency Medicine | Admitting: Emergency Medicine

## 2022-09-02 ENCOUNTER — Other Ambulatory Visit: Payer: Self-pay

## 2022-09-02 ENCOUNTER — Emergency Department (HOSPITAL_COMMUNITY): Payer: Self-pay

## 2022-09-02 DIAGNOSIS — R0789 Other chest pain: Secondary | ICD-10-CM | POA: Insufficient documentation

## 2022-09-02 DIAGNOSIS — I10 Essential (primary) hypertension: Secondary | ICD-10-CM | POA: Insufficient documentation

## 2022-09-02 LAB — BASIC METABOLIC PANEL
Anion gap: 8 (ref 5–15)
BUN: 14 mg/dL (ref 6–20)
CO2: 27 mmol/L (ref 22–32)
Calcium: 8.9 mg/dL (ref 8.9–10.3)
Chloride: 101 mmol/L (ref 98–111)
Creatinine, Ser: 0.93 mg/dL (ref 0.61–1.24)
GFR, Estimated: >60 mL/min (ref >60)
Glucose, Bld: 97 mg/dL (ref 70–99)
Potassium: 4.3 mmol/L (ref 3.5–5.1)
Sodium: 136 mmol/L (ref 135–145)

## 2022-09-02 LAB — CBC WITH DIFFERENTIAL/PLATELET
Abs Immature Granulocytes: 0.02 10*3/uL (ref 0.00–0.07)
Basophils Absolute: 0 10*3/uL (ref 0.0–0.1)
Basophils Relative: 1 %
Eosinophils Absolute: 0.2 10*3/uL (ref 0.0–0.5)
Eosinophils Relative: 2 %
HCT: 47.1 % (ref 39.0–52.0)
Hemoglobin: 15 g/dL (ref 13.0–17.0)
Immature Granulocytes: 0 %
Lymphocytes Relative: 40 %
Lymphs Abs: 3.1 10*3/uL (ref 0.7–4.0)
MCH: 28 pg (ref 26.0–34.0)
MCHC: 31.8 g/dL (ref 30.0–36.0)
MCV: 88 fL (ref 80.0–100.0)
Monocytes Absolute: 0.6 10*3/uL (ref 0.1–1.0)
Monocytes Relative: 8 %
Neutro Abs: 3.7 10*3/uL (ref 1.7–7.7)
Neutrophils Relative %: 49 %
Platelets: 251 10*3/uL (ref 150–400)
RBC: 5.35 MIL/uL (ref 4.22–5.81)
RDW: 13.4 % (ref 11.5–15.5)
WBC: 7.6 10*3/uL (ref 4.0–10.5)
nRBC: 0 % (ref 0.0–0.2)

## 2022-09-02 LAB — TROPONIN I (HIGH SENSITIVITY): Troponin I (High Sensitivity): 4 ng/L (ref <18)

## 2022-09-02 MED ORDER — ALUM & MAG HYDROXIDE-SIMETH 200-200-20 MG/5ML PO SUSP
30.0000 mL | Freq: Once | ORAL | Status: AC
  Administered 2022-09-02: 30 mL via ORAL
  Filled 2022-09-02: qty 30

## 2022-09-02 MED ORDER — ACETAMINOPHEN 500 MG PO TABS
1000.0000 mg | ORAL_TABLET | Freq: Once | ORAL | Status: AC
  Administered 2022-09-02: 1000 mg via ORAL
  Filled 2022-09-02: qty 2

## 2022-09-02 NOTE — ED Provider Notes (Signed)
[facility] Provider Note  CSN: [id number] Arrival date & time: 09/02/22 [id number]  Chief Complaint(s) Chest Pain  HPI Matthew Alvarez is a 32 y.o. male with history of hypertension, GERD presenting to the emergency department with chest pain.  He reports substernal chest pain.  Reports it is worse with some foods such as soda or spicy foods.  Reports it began last night after eating cookout.  No nausea or vomiting.  No shortness of breath.  No diaphoresis.  He reports he also lifts heavy objects at work and movement worsens the pain.  He has not taken anything for his symptoms.  He reports that he is getting a tooth extraction soon and wanted to make sure that his chest pain would not interfere with this.   Past Medical History Past Medical History:  Diagnosis Date   GERD (gastroesophageal reflux disease)    Hypertension    Patient Active Problem List   Diagnosis Date Noted   CLOSED FRACTURE OF NECK OF RADIUS 07/09/2010   Home Medication(s) Prior to Admission medications   Medication Sig Start Date End Date Taking? Authorizing Provider  omeprazole (PRILOSEC) 20 MG capsule Take 2 capsules (40 mg total) by mouth daily. 12/12/21 09/02/22 Yes Kommor, Madison, MD  famotidine (PEPCID) 20 MG tablet Take 1 tablet (20 mg total) by mouth 2 (two) times daily. Take 30 minutes before breakfast and dinner 01/09/22 02/08/22  Wyvonnia Dusky, MD  metroNIDAZOLE (FLAGYL) 500 MG tablet Take 1 tablet (500 mg total) by mouth 3 (three) times daily. Patient not taking: Reported on 09/02/2022 03/18/22   Orpah Greek, MD  ondansetron (ZOFRAN-ODT) 4 MG disintegrating tablet Take 1 tablet (4 mg total) by mouth every 8 (eight) hours as needed for nausea or vomiting. Patient not taking: Reported on 09/02/2022 01/14/22   Godfrey Pick, MD  oseltamivir (TAMIFLU) 75 MG capsule Take 1 capsule (75 mg total) by mouth every 12 (twelve) hours. Patient not taking: Reported on  09/02/2022 06/03/22   Veryl Speak, MD  sucralfate (CARAFATE) 1 GM/10ML suspension Take 10 mLs (1 g total) by mouth 4 (four) times daily -  with meals and at bedtime. Patient not taking: Reported on 09/02/2022 01/14/22   Godfrey Pick, MD                                                                                                                                    Past Surgical History Past Surgical History:  Procedure Laterality Date   TONSILLECTOMY AND ADENOIDECTOMY     Family History No family history on file.  Social History Social History   Tobacco Use   Smoking status: Never   Smokeless tobacco: Never  Vaping Use   Vaping Use: Never used  Substance Use Topics   Alcohol use: Yes    Comment: occ. use   Drug use: No   Allergies Clindamycin/lincomycin and Penicillins  Review of Systems Review of Systems  All other systems reviewed and are negative.   Physical Exam Vital Signs  I have reviewed the triage vital signs BP (!) 142/94 (BP Location: Right Arm)   Pulse 66   Temp 97.8 F (36.6 C) (Oral) Comment: Simultaneous filing. User may not have seen previous data. Comment (Src): Simultaneous filing. User may not have seen previous data.  Resp 16   Ht 5\' 9"  (1.753 m)   Wt 97.5 kg   SpO2 99%   BMI 31.75 kg/m  Physical Exam Vitals and nursing note reviewed.  Constitutional:      General: He is not in acute distress.    Appearance: Normal appearance.  HENT:     Mouth/Throat:     Mouth: Mucous membranes are moist.     Comments: Fractured right upper incisor.  No surrounding periapical abscess. Eyes:     Conjunctiva/sclera: Conjunctivae normal.  Cardiovascular:     Rate and Rhythm: Normal rate and regular rhythm.     Heart sounds: No murmur heard. Pulmonary:     Effort: Pulmonary effort is normal. No respiratory distress.     Breath sounds: Normal breath sounds.  Chest:     Chest wall: Tenderness (reproduces pain) present.  Abdominal:     General: Abdomen is  flat.     Palpations: Abdomen is soft.     Tenderness: There is no abdominal tenderness.  Musculoskeletal:     Right lower leg: No edema.     Left lower leg: No edema.  Skin:    General: Skin is warm and dry.     Capillary Refill: Capillary refill takes less than 2 seconds.  Neurological:     Mental Status: He is alert and oriented to person, place, and time. Mental status is at baseline.  Psychiatric:        Mood and Affect: Mood normal.        Behavior: Behavior normal.     ED Results and Treatments Labs (all labs ordered are listed, but only abnormal results are displayed) Labs Reviewed  BASIC METABOLIC PANEL  CBC WITH DIFFERENTIAL/PLATELET  TROPONIN I (HIGH SENSITIVITY)                                                                                                                          Radiology DG Chest Port 1 View  Result Date: 09/02/2022 CLINICAL DATA:  Provided history: Chest pain. Chest tightness. EXAM: PORTABLE CHEST 1 VIEW COMPARISON:  Prior chest radiographs 05/24/2019 and earlier. FINDINGS: Heart size within normal limits. No appreciable airspace consolidation. No evidence of pleural effusion or pneumothorax. No acute bony abnormality identified. IMPRESSION: No evidence of active cardiopulmonary disease. Electronically Signed   By: Kellie Simmering D.O.   On: 09/02/2022 08:54    Pertinent labs & imaging results that were available during my care of the patient were reviewed by me and considered in my medical decision making (see MDM for details).  Medications  Ordered in ED Medications  alum & mag hydroxide-simeth (MAALOX/MYLANTA) 200-200-20 MG/5ML suspension 30 mL (30 mLs Oral Given 09/02/22 0923)  acetaminophen (TYLENOL) tablet 1,000 mg (1,000 mg Oral Given 09/02/22 [id number])                                                                                                                                     Procedures Procedures  (including critical care  time)  Medical Decision Making / ED Course   MDM:  32 year old male presenting to the emergency department chest pain.  Patient overall well-appearing.  Very low concern for ACS.  EKG is reassuring and troponin is negative.  Doubt PE, patient PERC negative.  X-ray with no pneumothorax or pneumonia.  Could be GERD, given worsening with food, but patient also has some reproducible chest wall tenderness and worse with movement so could also be chest wall pain.  Patient worried that it could be related to his tooth, doubt that this is actually related to his tooth, he does not have any prosthetic heart valve and no murmur.  No evidence of tooth infection at this time he has dental follow-up.  Advise continuing his GERD medication, Tylenol or Mylanta as needed for pain given unclear cause. Will discharge patient to home. All questions answered. Patient comfortable with plan of discharge. Return precautions discussed with patient and specified on the after visit summary.       Additional history obtained: -External records from outside source obtained and reviewed including: Chart review including previous notes, labs, imaging, consultation notes including ED visit for flu 06/03/22   Lab Tests: -I ordered, reviewed, and interpreted labs.   The pertinent results include:   Labs Reviewed  BASIC METABOLIC PANEL  CBC WITH DIFFERENTIAL/PLATELET  TROPONIN I (HIGH SENSITIVITY)    Notable for normal labs  EKG   EKG Interpretation  Date/Time:  Tuesday September 02 2022 07:47:53 EDT Ventricular Rate:  58 PR Interval:  190 QRS Duration: 103 QT Interval:  410 QTC Calculation: 403 R Axis:   34 Text Interpretation: Sinus rhythm ST elev, probable normal early repol pattern Confirmed by Garnette Gunner [phone]) on 09/02/2022 10:01:55 AM         Imaging Studies ordered: I ordered imaging studies including CXR On my interpretation imaging demonstrates no acute process I independently visualized  and interpreted imaging. I agree with the radiologist interpretation   Medicines ordered and prescription drug management: Meds ordered this encounter  Medications   alum & mag hydroxide-simeth (MAALOX/MYLANTA) 200-200-20 MG/5ML suspension 30 mL   acetaminophen (TYLENOL) tablet 1,000 mg    -I have reviewed the patients home medicines and have made adjustments as needed    Cardiac Monitoring: The patient was maintained on a cardiac monitor.  I personally viewed and interpreted the cardiac monitored which showed an underlying rhythm of: NSR  Social Determinants of Health:  Diagnosis or treatment significantly limited by social determinants of  health: obesity   Reevaluation: After the interventions noted above, I reevaluated the patient and found that their symptoms have improved  Co morbidities that complicate the patient evaluation  Past Medical History:  Diagnosis Date   GERD (gastroesophageal reflux disease)    Hypertension       Dispostion: Disposition decision including need for hospitalization was considered, and patient discharged from emergency department.    Final Clinical Impression(s) / ED Diagnoses Final diagnoses:  Atypical chest pain     This chart was dictated using voice recognition software.  Despite best efforts to proofread,  errors can occur which can change the documentation meaning.    Cristie Hem, MD 09/02/22 1118

## 2022-09-02 NOTE — Discharge Instructions (Signed)
Please take Tylenol and Motrin for your symptoms at home.  You can take 1000 mg of Tylenol every 6 hours and 600 mg of ibuprofen every 6 hours as needed for your symptoms.  You can take these medicines together as needed, either at the same time, or alternating every 3 hours.  

## 2022-09-02 NOTE — ED Triage Notes (Signed)
Pt reports mid sternal chest pain since last night.  Denies SOB, N/V/D.  States he has a broken tooth that he knows can cause heart trouble.  Wants to make sure his heart is okay before he goes to dental surgery on Friday. Reports he does take omeprazole for indigestion.

## 2022-10-02 ENCOUNTER — Emergency Department (HOSPITAL_COMMUNITY): Payer: Self-pay

## 2022-10-02 ENCOUNTER — Other Ambulatory Visit: Payer: Self-pay

## 2022-10-02 ENCOUNTER — Encounter (HOSPITAL_COMMUNITY): Payer: Self-pay | Admitting: Emergency Medicine

## 2022-10-02 DIAGNOSIS — R079 Chest pain, unspecified: Secondary | ICD-10-CM | POA: Insufficient documentation

## 2022-10-02 DIAGNOSIS — X500XXA Overexertion from strenuous movement or load, initial encounter: Secondary | ICD-10-CM | POA: Insufficient documentation

## 2022-10-02 DIAGNOSIS — Y990 Civilian activity done for income or pay: Secondary | ICD-10-CM | POA: Insufficient documentation

## 2022-10-02 LAB — CBC
HCT: 44.3 % (ref 39.0–52.0)
Hemoglobin: 14.5 g/dL (ref 13.0–17.0)
MCH: 27.9 pg (ref 26.0–34.0)
MCHC: 32.7 g/dL (ref 30.0–36.0)
MCV: 85.4 fL (ref 80.0–100.0)
Platelets: 262 10*3/uL (ref 150–400)
RBC: 5.19 MIL/uL (ref 4.22–5.81)
RDW: 13.1 % (ref 11.5–15.5)
WBC: 9.6 10*3/uL (ref 4.0–10.5)
nRBC: 0 % (ref 0.0–0.2)

## 2022-10-02 LAB — BASIC METABOLIC PANEL
Anion gap: 8 (ref 5–15)
BUN: 15 mg/dL (ref 6–20)
CO2: 25 mmol/L (ref 22–32)
Calcium: 8.7 mg/dL — ABNORMAL LOW (ref 8.9–10.3)
Chloride: 104 mmol/L (ref 98–111)
Creatinine, Ser: 1.03 mg/dL (ref 0.61–1.24)
GFR, Estimated: >60 mL/min (ref >60)
Glucose, Bld: 81 mg/dL (ref 70–99)
Potassium: 3.8 mmol/L (ref 3.5–5.1)
Sodium: 137 mmol/L (ref 135–145)

## 2022-10-02 LAB — TROPONIN I (HIGH SENSITIVITY): Troponin I (High Sensitivity): 4 ng/L (ref <18)

## 2022-10-02 NOTE — ED Triage Notes (Signed)
Pt arrives c/o still having GERD symptoms since being seen in the past for the same. Pt also concerned it may be muscle pain due to moving heavy items while at work.

## 2022-10-03 ENCOUNTER — Emergency Department (HOSPITAL_COMMUNITY)
Admission: EM | Admit: 2022-10-03 | Discharge: 2022-10-03 | Disposition: A | Discharge status: Home / Self Care | Payer: Self-pay | Attending: Emergency Medicine | Admitting: Emergency Medicine

## 2022-10-03 ENCOUNTER — Other Ambulatory Visit: Payer: Self-pay

## 2022-10-03 DIAGNOSIS — R079 Chest pain, unspecified: Secondary | ICD-10-CM

## 2022-10-03 LAB — HEPATIC FUNCTION PANEL
ALT: 36 U/L (ref 0–44)
AST: 35 U/L (ref 15–41)
Albumin: 3.9 g/dL (ref 3.5–5.0)
Alkaline Phosphatase: 83 U/L (ref 38–126)
Bilirubin, Direct: <0.1 mg/dL (ref 0.0–0.2)
Total Bilirubin: 0.2 mg/dL — ABNORMAL LOW (ref 0.3–1.2)
Total Protein: 7.5 g/dL (ref 6.5–8.1)

## 2022-10-03 LAB — TROPONIN I (HIGH SENSITIVITY): Troponin I (High Sensitivity): 4 ng/L (ref <18)

## 2022-10-03 LAB — LIPASE, BLOOD: Lipase: 33 U/L (ref 11–51)

## 2022-10-03 MED ORDER — OMEPRAZOLE 20 MG PO CPDR: 40.0000 mg | DELAYED_RELEASE_CAPSULE | Freq: Every day | ORAL | 0 refills | Status: DC

## 2022-10-03 MED ORDER — ALUM & MAG HYDROXIDE-SIMETH 200-200-20 MG/5ML PO SUSP
30.0000 mL | Freq: Once | ORAL | Status: AC
  Administered 2022-10-03: 30 mL via ORAL
  Filled 2022-10-03: qty 30

## 2022-10-03 MED ORDER — FAMOTIDINE 20 MG PO TABS: 20.0000 mg | ORAL_TABLET | Freq: Two times a day (BID) | ORAL | 0 refills | Status: DC

## 2022-10-03 MED ORDER — LIDOCAINE VISCOUS HCL 2 % MT SOLN
15.0000 mL | Freq: Once | OROMUCOSAL | Status: AC
  Administered 2022-10-03: 15 mL via ORAL
  Filled 2022-10-03: qty 15

## 2022-10-03 MED ORDER — PANTOPRAZOLE SODIUM 40 MG PO TBEC
40.0000 mg | DELAYED_RELEASE_TABLET | Freq: Once | ORAL | Status: AC
  Administered 2022-10-03: 40 mg via ORAL
  Filled 2022-10-03: qty 1

## 2022-10-03 MED ORDER — FAMOTIDINE 20 MG PO TABS
20.0000 mg | ORAL_TABLET | Freq: Once | ORAL | Status: AC
  Administered 2022-10-03: 20 mg via ORAL
  Filled 2022-10-03: qty 1

## 2022-10-03 MED ORDER — SUCRALFATE 1 GM/10ML PO SUSP: 1.0000 g | Freq: Three times a day (TID) | ORAL | 0 refills | Status: DC

## 2022-10-03 NOTE — ED Provider Notes (Signed)
[place] EMERGENCY DEPARTMENT AT [facility] Provider Note   CSN: [id number] Arrival date & time: 10/02/22  2104     History  Chief Complaint  Patient presents with   Gastroesophageal Reflux    Matthew Alvarez is a 32 y.o. male.  32 year old male that presents the ER today secondary to chest pain.  Patient states it feels similar to previous episodes of indigestion however this is lasted longer and did not get better with Alka-Seltzer/omeprazole combo.  Tried warm compresses also that did not help.  No nausea, vomiting, shortness of breath, diaphoresis, lightheadedness or other associated symptoms.  No trauma.  He does work lifting heavy things but no recent injuries or more heavy than normal.    Gastroesophageal Reflux       Home Medications Prior to Admission medications   Medication Sig Start Date End Date Taking? Authorizing Provider  famotidine (PEPCID) 20 MG tablet Take 1 tablet (20 mg total) by mouth 2 (two) times daily. Take 30 minutes before breakfast and dinner 10/03/22 11/02/22  Bellamie Turney, Barbara Cower, MD  omeprazole (PRILOSEC) 20 MG capsule Take 2 capsules (40 mg total) by mouth daily. 10/03/22 11/02/22  Steph Cheadle, Barbara Cower, MD  sucralfate (CARAFATE) 1 GM/10ML suspension Take 10 mLs (1 g total) by mouth 4 (four) times daily -  with meals and at bedtime. 10/03/22   Akaash Vandewater, Barbara Cower, MD      Allergies    Clindamycin/lincomycin and Penicillins    Review of Systems   Review of Systems  Physical Exam Updated Vital Signs BP 120/77   Pulse (!) 53   Temp 98.1 F (36.7 C) (Oral)   Resp 18   Ht  (1.753 m)   Wt 97.5 kg   SpO2 99%   BMI 31.75 kg/m  Physical Exam Vitals and nursing note reviewed.  Constitutional:      Appearance: He is well-developed.  HENT:     Head: Normocephalic and atraumatic.  Eyes:     Pupils: Pupils are equal, round, and reactive to light.  Cardiovascular:     Rate and Rhythm: Normal rate.  Pulmonary:     Effort: Pulmonary effort is  normal. No respiratory distress.  Abdominal:     General: There is no distension.  Musculoskeletal:        General: Normal range of motion.     Cervical back: Normal range of motion.  Skin:    General: Skin is warm and dry.  Neurological:     General: No focal deficit present.     Mental Status: He is alert.     ED Results / Procedures / Treatments   Labs (all labs ordered are listed, but only abnormal results are displayed) Labs Reviewed  BASIC METABOLIC PANEL - Abnormal; Notable for the following components:      Result Value   Calcium 8.7 (*)    All other components within normal limits  HEPATIC FUNCTION PANEL - Abnormal; Notable for the following components:   Total Bilirubin 0.2 (*)    All other components within normal limits  CBC  LIPASE, BLOOD  TROPONIN I (HIGH SENSITIVITY)  TROPONIN I (HIGH SENSITIVITY)    EKG EKG Interpretation  Date/Time:  Friday October 03 2022 01:09:38 EDT Ventricular Rate:  50 PR Interval:  181 QRS Duration: 99 QT Interval:  425 QTC Calculation: 388 R Axis:   -2 Text Interpretation: Sinus rhythm Lateral infarct, acute Confirmed by Marily Memos [phone]) on 10/03/2022 1:19:26 AM  Radiology DG Chest 2 View  Result Date: 10/02/2022 CLINICAL DATA:  Burning symptoms in the chest EXAM: CHEST - 2 VIEW COMPARISON:  09/02/2022 FINDINGS: The heart size and mediastinal contours are within normal limits. Both lungs are clear. The visualized skeletal structures are unremarkable. IMPRESSION: No active cardiopulmonary disease. Electronically Signed   By: Gaylyn Rong M.D.   On: 10/02/2022 21:42    Procedures Procedures    Medications Ordered in ED Medications  pantoprazole (PROTONIX) EC tablet 40 mg (has no administration in time range)  famotidine (PEPCID) tablet 20 mg (has no administration in time range)  alum & mag hydroxide-simeth (MAALOX/MYLANTA) 200-200-20 MG/5ML suspension 30 mL (30 mLs Oral Given 10/03/22 0102)    And  lidocaine  (XYLOCAINE) 2 % viscous mouth solution 15 mL (15 mLs Oral Given 10/03/22 0102)    ED Course/ Medical Decision Making/ A&P                             Medical Decision Making Amount and/or Complexity of Data Reviewed Labs: ordered. Radiology: ordered. ECG/medicine tests: ordered.  Risk OTC drugs. Prescription drug management.   ECG looks a little bit different than previous with inverted T waves and ST depression in lead III.  Troponin is negative.  Discussed with cardiology felt this is likely a normal variant even though it was new from previous.  She suggested treatment for GI and if not improving to follow-up with his doctor for stress test.  Patient was sleeping on repeat exam pain is gone.  No new symptoms.   Final Clinical Impression(s) / ED Diagnoses Final diagnoses:  Nonspecific chest pain    Rx / DC Orders ED Discharge Orders          Ordered    famotidine (PEPCID) 20 MG tablet  2 times daily        10/03/22 0207    omeprazole (PRILOSEC) 20 MG capsule  Daily        10/03/22 0207    sucralfate (CARAFATE) 1 GM/10ML suspension  3 times daily with meals & bedtime        10/03/22 0207              Terryl Molinelli, Barbara Cower, MD 10/03/22 0981

## 2023-01-05 ENCOUNTER — Emergency Department (HOSPITAL_COMMUNITY)
Admission: EM | Admit: 2023-01-05 | Discharge: 2023-01-05 | Disposition: A | Discharge status: Home / Self Care | Payer: Self-pay | Attending: Emergency Medicine | Admitting: Emergency Medicine

## 2023-01-05 ENCOUNTER — Emergency Department (HOSPITAL_COMMUNITY): Payer: Self-pay

## 2023-01-05 ENCOUNTER — Other Ambulatory Visit: Payer: Self-pay

## 2023-01-05 ENCOUNTER — Encounter (HOSPITAL_COMMUNITY): Payer: Self-pay

## 2023-01-05 DIAGNOSIS — I10 Essential (primary) hypertension: Secondary | ICD-10-CM | POA: Insufficient documentation

## 2023-01-05 DIAGNOSIS — Y990 Civilian activity done for income or pay: Secondary | ICD-10-CM | POA: Insufficient documentation

## 2023-01-05 DIAGNOSIS — Z79899 Other long term (current) drug therapy: Secondary | ICD-10-CM | POA: Insufficient documentation

## 2023-01-05 DIAGNOSIS — X500XXA Overexertion from strenuous movement or load, initial encounter: Secondary | ICD-10-CM | POA: Insufficient documentation

## 2023-01-05 DIAGNOSIS — S39012A Strain of muscle, fascia and tendon of lower back, initial encounter: Secondary | ICD-10-CM | POA: Insufficient documentation

## 2023-01-05 MED ORDER — CYCLOBENZAPRINE HCL 10 MG PO TABS: 10.0000 mg | ORAL_TABLET | Freq: Three times a day (TID) | ORAL | 0 refills | Status: DC

## 2023-01-05 MED ORDER — IBUPROFEN 800 MG PO TABS: 800.0000 mg | ORAL_TABLET | Freq: Three times a day (TID) | ORAL | 0 refills | Status: DC

## 2023-01-05 NOTE — ED Provider Notes (Addendum)
[place] EMERGENCY DEPARTMENT AT [facility] Provider Note   CSN: [id number] Arrival date & time: 01/05/23  0740     History  Chief Complaint  Patient presents with   Back Pain    Matthew Alvarez is a 32 y.o. male.  Patient with a complaint of right flank and right CVA area pain since the end of June.  At 1 point got a little bit better he has been taking Motrin they did give him a muscle relaxer he was seen at [facility] ED no imaging done.  No history of kidney stones.  Patient does a lot of heavy lifting at work.  But pain is not worse by movement.  Past medical history significant just for hypertension and gastroesophageal reflux disease.  Patient's blood pressure here is 137/89.  Patient is not on any blood pressure medicine currently.  Is on Pepcid and Carafate.  Patient does not use tobacco products.  No fall or injury.       Home Medications Prior to Admission medications   Medication Sig Start Date End Date Taking? Authorizing Provider  cyclobenzaprine (FLEXERIL) 10 MG tablet Take 1 tablet (10 mg total) by mouth 3 (three) times daily. 01/05/23  Yes Vanetta Mulders, MD  ibuprofen (ADVIL) 800 MG tablet Take 1 tablet (800 mg total) by mouth 3 (three) times daily. 01/05/23  Yes Vanetta Mulders, MD  famotidine (PEPCID) 20 MG tablet Take 1 tablet (20 mg total) by mouth 2 (two) times daily. Take 30 minutes before breakfast and dinner 10/03/22 11/02/22  Mesner, Barbara Cower, MD  omeprazole (PRILOSEC) 20 MG capsule Take 2 capsules (40 mg total) by mouth daily. 10/03/22 11/02/22  Mesner, Barbara Cower, MD  sucralfate (CARAFATE) 1 GM/10ML suspension Take 10 mLs (1 g total) by mouth 4 (four) times daily -  with meals and at bedtime. 10/03/22   Mesner, Barbara Cower, MD      Allergies    Clindamycin/lincomycin and Penicillins    Review of Systems   Review of Systems  Constitutional:  Negative for chills and fever.  HENT:  Negative for ear pain and sore throat.   Eyes:  Negative for pain and  visual disturbance.  Respiratory:  Negative for cough and shortness of breath.   Cardiovascular:  Negative for chest pain and palpitations.  Gastrointestinal:  Negative for abdominal pain and vomiting.  Genitourinary:  Positive for flank pain. Negative for dysuria and hematuria.  Musculoskeletal:  Positive for back pain. Negative for arthralgias.  Skin:  Negative for color change and rash.  Neurological:  Negative for seizures and syncope.  All other systems reviewed and are negative.   Physical Exam Updated Vital Signs BP 133/86   Pulse (!) 55   Temp 98.1 F (36.7 C) (Oral)   Resp 18   Ht 1.753 m (5\' 9" )   Wt 106.6 kg   SpO2 98%   BMI 34.70 kg/m  Physical Exam Vitals and nursing note reviewed.  Constitutional:      General: He is not in acute distress.    Appearance: Normal appearance. He is well-developed. He is not ill-appearing.  HENT:     Head: Normocephalic and atraumatic.  Eyes:     Conjunctiva/sclera: Conjunctivae normal.  Cardiovascular:     Rate and Rhythm: Normal rate and regular rhythm.     Heart sounds: No murmur heard. Pulmonary:     Effort: Pulmonary effort is normal. No respiratory distress.     Breath sounds: Normal breath sounds.  Abdominal:  Palpations: Abdomen is soft.     Tenderness: There is no abdominal tenderness.  Musculoskeletal:        General: No swelling or tenderness.     Cervical back: Neck supple.  Skin:    General: Skin is warm and dry.     Capillary Refill: Capillary refill takes less than 2 seconds.  Neurological:     General: No focal deficit present.     Mental Status: He is alert and oriented to person, place, and time.  Psychiatric:        Mood and Affect: Mood normal.     ED Results / Procedures / Treatments   Labs (all labs ordered are listed, but only abnormal results are displayed) Labs Reviewed - No data to display  EKG None  Radiology CT Renal Stone Study  Result Date: 01/05/2023 CLINICAL DATA:   Abdominal/flank pain, stone suspected EXAM: CT ABDOMEN AND PELVIS WITHOUT CONTRAST TECHNIQUE: Multidetector CT imaging of the abdomen and pelvis was performed following the standard protocol without IV contrast. RADIATION DOSE REDUCTION: This exam was performed according to the departmental dose-optimization program which includes automated exposure control, adjustment of the mA and/or kV according to patient size and/or use of iterative reconstruction technique. COMPARISON:  CT scan abdomen and pelvis from 01/14/2022. FINDINGS: Lower chest: There are subpleural atelectatic changes in the visualized lung bases. No overt consolidation. No pleural effusion. The heart is normal in size. No pericardial effusion. Hepatobiliary: The liver is normal in size. Non-cirrhotic configuration. No suspicious mass. No intrahepatic or extrahepatic bile duct dilation. No calcified gallstones. Normal gallbladder wall thickness. No pericholecystic inflammatory changes. Pancreas: Unremarkable. No pancreatic ductal dilatation or surrounding inflammatory changes. Spleen: Within normal limits. No focal lesion. Adrenals/Urinary Tract: Adrenal glands are unremarkable. No suspicious renal mass. No hydronephrosis. No renal or ureteric calculi. Unremarkable urinary bladder. Stomach/Bowel: No disproportionate dilation of the small or large bowel loops. No evidence of abnormal bowel wall thickening or inflammatory changes. The appendix is unremarkable. Vascular/Lymphatic: No ascites or pneumoperitoneum. No abdominal or pelvic lymphadenopathy, by size criteria. No aneurysmal dilation of the major abdominal arteries. Reproductive: Normal size prostate. Symmetric seminal vesicles. Other: There is a tiny fat containing umbilical hernia. The soft tissues and abdominal wall are otherwise unremarkable. Musculoskeletal: No suspicious osseous lesions. IMPRESSION: 1. No nephroureterolithiasis or obstructive uropathy. 2. No acute inflammatory process  identified within the abdomen or pelvis. Normal appendix. Electronically Signed   By: Jules Schick M.D.   On: 01/05/2023 09:07    Procedures Procedures    Medications Ordered in ED Medications - No data to display  ED Course/ Medical Decision Making/ A&P                             Medical Decision Making Amount and/or Complexity of Data Reviewed Radiology: ordered.  Risk Prescription drug management.   Clinically could be musculoskeletal.  But not made worse by movement.  No radiation of any pain into the legs no incontinence no numbness to the lower extremities or feet.  I will going get CT renal just to rule out may be an atypical stone presentation.  If negative will refer to orthopedics and will treat with muscle relaxer.  Work note as needed  CT abdomen no evidence of stones no bony abnormalities.  Will treat as musculoskeletal. Final Clinical Impression(s) / ED Diagnoses Final diagnoses:  Strain of lumbar region, initial encounter    Rx / DC Orders  ED Discharge Orders          Ordered    ibuprofen (ADVIL) 800 MG tablet  3 times daily        01/05/23 0918    cyclobenzaprine (FLEXERIL) 10 MG tablet  3 times daily        01/05/23 6578              Vanetta Mulders, MD 01/05/23 Theodis Blaze    Vanetta Mulders, MD 01/05/23 [phone]

## 2023-01-05 NOTE — Discharge Instructions (Signed)
X-ray negative for any bony abnormalities or any evidence of any kidney stones.  Take the Motrin 800 mg as directed along with the Flexeril as directed make an appointment to follow-up with orthopedics.  Work note provided.

## 2023-01-05 NOTE — ED Triage Notes (Signed)
Pt c/o lower back pain in the middle. 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 Pt states he went to the hospital 2 weeks ago and they dx him with "flank pain", they gave him muscle relaxer and that helped but it has started hurting again.

## 2023-03-15 ENCOUNTER — Encounter (HOSPITAL_COMMUNITY): Payer: Self-pay

## 2023-03-15 ENCOUNTER — Emergency Department (HOSPITAL_COMMUNITY)
Admission: EM | Admit: 2023-03-15 | Discharge: 2023-03-16 | Disposition: A | Discharge status: Home / Self Care | Payer: Self-pay | Attending: Emergency Medicine | Admitting: Emergency Medicine

## 2023-03-15 ENCOUNTER — Other Ambulatory Visit: Payer: Self-pay

## 2023-03-15 DIAGNOSIS — R1013 Epigastric pain: Secondary | ICD-10-CM | POA: Insufficient documentation

## 2023-03-15 NOTE — ED Triage Notes (Signed)
Pt c/o abd pain and burning after eating spaghetti tonight, pt with hx of GERD, was on omeprazole, quit taking several days ago b/c he ran out.

## 2023-03-16 MED ORDER — OMEPRAZOLE 20 MG PO CPDR: 20.0000 mg | DELAYED_RELEASE_CAPSULE | Freq: Every day | ORAL | 0 refills | Status: DC

## 2023-03-16 MED ORDER — PANTOPRAZOLE SODIUM 40 MG PO TBEC
40.0000 mg | DELAYED_RELEASE_TABLET | Freq: Every day | ORAL | Status: DC
  Administered 2023-03-16: 40 mg via ORAL
  Filled 2023-03-16: qty 1

## 2023-03-16 MED ORDER — ALUM & MAG HYDROXIDE-SIMETH 200-200-20 MG/5ML PO SUSP
30.0000 mL | Freq: Once | ORAL | Status: AC
  Administered 2023-03-16: 30 mL via ORAL
  Filled 2023-03-16: qty 30

## 2023-03-16 NOTE — Discharge Instructions (Addendum)

## 2023-03-16 NOTE — ED Provider Notes (Signed)
[place] EMERGENCY DEPARTMENT AT [facility] Provider Note   CSN: [id number] Arrival date & time: 03/15/23  2042     History  Chief Complaint  Patient presents with   Abdominal Pain    Matthew Alvarez is a 32 y.o. male.  The history is provided by the patient.  Patient reports over the past week he has had intermittent episodes of upper abdominal pain and burning consistent with his previous GERD.  He has previously used omeprazole but has run out recently.  He also reports NSAID use.  No fevers or vomiting.  No black or bloody stools.  No chest pain or shortness of breath No previous abdominal surgeries.  He hopes to get insurance soon so he can be seen by gastroenterology and have an endoscopy     Home Medications Prior to Admission medications   Medication Sig Start Date End Date Taking? Authorizing Provider  omeprazole (PRILOSEC) 20 MG capsule Take 1 capsule (20 mg total) by mouth daily. 03/16/23  Yes Zadie Rhine, MD      Allergies    Clindamycin/lincomycin and Penicillins    Review of Systems   Review of Systems  Constitutional:  Negative for fever.  Respiratory:  Negative for shortness of breath.   Cardiovascular:  Negative for chest pain.  Gastrointestinal:  Positive for abdominal pain. Negative for blood in stool and vomiting.    Physical Exam Updated Vital Signs BP (!) 154/95   Pulse (!) 59   Temp 98 F (36.7 C) (Oral)   Resp 19   Ht 1.753 m (5\' 9" )   Wt 108.9 kg   SpO2 99%   BMI 35.44 kg/m  Physical Exam CONSTITUTIONAL: Well developed/well nourished, no distress using his phone HEAD: Normocephalic/atraumatic EYES: EOMI/PERRL, no icterus ENMT: Mucous membranes moist NECK: supple no meningeal signs CV: S1/S2 noted, no murmurs/rubs/gallops noted LUNGS: Lungs are clear to auscultation bilaterally, no apparent distress ABDOMEN: soft, mild epigastric tenderness, no rebound or guarding, bowel sounds noted throughout abdomen GU:no cva  tenderness NEURO: Pt is awake/alert/appropriate, moves all extremitiesx4.  No facial droop.   EXTREMITIES: pulses normal/equal, full ROM SKIN: warm, color normal PSYCH: no abnormalities of mood noted, alert and oriented to situation  ED Results / Procedures / Treatments   Labs (all labs ordered are listed, but only abnormal results are displayed) Labs Reviewed - No data to display  EKG EKG Interpretation Date/Time:  Sunday March 15 2023 23:31:18 EDT Ventricular Rate:  49 PR Interval:  190 QRS Duration:  100 QT Interval:  421 QTC Calculation: 380 R Axis:   9  Text Interpretation: Sinus bradycardia RSR' in V1 or V2, right VCD or RVH No significant change since last tracing Confirmed by Zadie Rhine ([id number]) on 03/15/2023 11:42:53 PM  Radiology No results found.  Procedures Procedures    Medications Ordered in ED Medications  pantoprazole (PROTONIX) EC tablet 40 mg (40 mg Oral Given 03/16/23 0034)  alum & mag hydroxide-simeth (MAALOX/MYLANTA) 200-200-20 MG/5ML suspension 30 mL (30 mLs Oral Given 03/16/23 0034)    ED Course/ Medical Decision Making/ A&P                                 Medical Decision Making Amount and/or Complexity of Data Reviewed ECG/medicine tests: ordered.  Risk OTC drugs. Prescription drug management.   This patient presents to the ED for concern of abdominal pain, this involves an extensive number of treatment  options, and is a complaint that carries with it a high risk of complications and morbidity.  The differential diagnosis includes but is not limited to cholecystitis, cholelithiasis, pancreatitis, gastritis, peptic ulcer disease, appendicitis, bowel obstruction, bowel perforation, diverticulitis, AAA, ischemic bowel, acute coronary syndrome, GERD   Social Determinants of Health: Patient's lack of insurance and impaired access to primary care  increases the complexity of managing their presentation   Medicines ordered and  prescription drug management: I ordered medication including GI cocktail and PPI for abdominal pain Reevaluation of the patient after these medicines showed that the patient    improved  Test Considered: Given history/exam will defer CT imaging for now    Reevaluation: After the interventions noted above, I reevaluated the patient and found that they have :improved  Complexity of problems addressed: Patient's presentation is most consistent with  acute presentation with potential threat to life or bodily function  Disposition: After consideration of the diagnostic results and the patient's response to treatment,  I feel that the patent would benefit from discharge   .           Final Clinical Impression(s) / ED Diagnoses Final diagnoses:  Epigastric pain    Rx / DC Orders ED Discharge Orders          Ordered    omeprazole (PRILOSEC) 20 MG capsule  Daily        03/16/23 0018              Zadie Rhine, MD 03/16/23 0102

## 2023-05-04 ENCOUNTER — Emergency Department (HOSPITAL_COMMUNITY)
Admission: EM | Admit: 2023-05-04 | Discharge: 2023-05-04 | Disposition: A | Discharge status: Home / Self Care | Payer: Self-pay | Attending: Emergency Medicine | Admitting: Emergency Medicine

## 2023-05-04 ENCOUNTER — Other Ambulatory Visit: Payer: Self-pay

## 2023-05-04 ENCOUNTER — Encounter (HOSPITAL_COMMUNITY): Payer: Self-pay

## 2023-05-04 DIAGNOSIS — K219 Gastro-esophageal reflux disease without esophagitis: Secondary | ICD-10-CM | POA: Insufficient documentation

## 2023-05-04 MED ORDER — SUCRALFATE 1 G PO TABS: 1.0000 g | ORAL_TABLET | Freq: Three times a day (TID) | ORAL | 0 refills | Status: DC

## 2023-05-04 MED ORDER — ALUM & MAG HYDROXIDE-SIMETH 200-200-20 MG/5ML PO SUSP
30.0000 mL | Freq: Once | ORAL | Status: AC
  Administered 2023-05-04: 30 mL via ORAL
  Filled 2023-05-04: qty 30

## 2023-05-04 MED ORDER — FAMOTIDINE 20 MG PO TABS: 20.0000 mg | ORAL_TABLET | Freq: Two times a day (BID) | ORAL | 0 refills | Status: DC

## 2023-05-04 MED ORDER — HYOSCYAMINE SULFATE 0.125 MG SL SUBL
0.2500 mg | SUBLINGUAL_TABLET | Freq: Once | SUBLINGUAL | Status: AC
  Administered 2023-05-04: 0.25 mg via SUBLINGUAL
  Filled 2023-05-04: qty 2

## 2023-05-04 MED ORDER — FAMOTIDINE 20 MG PO TABS
40.0000 mg | ORAL_TABLET | Freq: Once | ORAL | Status: AC
  Administered 2023-05-04: 40 mg via ORAL
  Filled 2023-05-04: qty 2

## 2023-05-04 NOTE — Discharge Instructions (Addendum)
Take the Prilosec twice a day for the next week, then take it once a day.  Take the Pepcid twice a day for the next week and take the Carafate as prescribed.  Return for vomiting blood, rectal bleeding or passing black stools, increasing pain, weakness, dizziness or passing out

## 2023-05-04 NOTE — ED Triage Notes (Signed)
Pt to ED cc upper abdominal pain. Chronic issue, endorses acid reflux and takes home meds without relief.

## 2023-05-04 NOTE — ED Provider Notes (Signed)
[place] EMERGENCY DEPARTMENT AT [facility] Provider Note   CSN: [id number] Arrival date & time: 05/04/23  0440     History  Chief Complaint  Patient presents with   Abdominal Pain    Matthew Alvarez is a 32 y.o. male.  Presents to the emergency department for evaluation of upper abdominal burning pain, reflux into the chest.  Patient has a history of GERD with similar symptoms.  No vomiting blood, no melena.  He just darted taking his Prilosec again, took 2 today but continues to have symptoms.       Home Medications Prior to Admission medications   Medication Sig Start Date End Date Taking? Authorizing Provider  famotidine (PEPCID) 20 MG tablet Take 1 tablet (20 mg total) by mouth 2 (two) times daily. 05/04/23  Yes Alizeh Madril, Canary Brim, MD  sucralfate (CARAFATE) 1 g tablet Take 1 tablet (1 g total) by mouth 4 (four) times daily -  with meals and at bedtime. 05/04/23  Yes Mahaila Tischer, Canary Brim, MD  omeprazole (PRILOSEC) 20 MG capsule Take 1 capsule (20 mg total) by mouth daily. 03/16/23   Zadie Rhine, MD      Allergies    Clindamycin/lincomycin and Penicillins    Review of Systems   Review of Systems  Physical Exam Updated Vital Signs BP (!) 144/88 (BP Location: Right Arm)   Pulse 60   Temp 98 F (36.7 C) (Oral)   Resp 18   Ht 5\' 9"  (1.753 m)   Wt 108.9 kg   SpO2 100%   BMI 35.44 kg/m  Physical Exam Vitals and nursing note reviewed.  Constitutional:      General: He is not in acute distress.    Appearance: He is well-developed.  HENT:     Head: Normocephalic and atraumatic.     Mouth/Throat:     Mouth: Mucous membranes are moist.  Eyes:     General: Vision grossly intact. Gaze aligned appropriately.     Extraocular Movements: Extraocular movements intact.     Conjunctiva/sclera: Conjunctivae normal.  Cardiovascular:     Rate and Rhythm: Normal rate and regular rhythm.     Pulses: Normal pulses.     Heart sounds: Normal heart  sounds, S1 normal and S2 normal. No murmur heard.    No friction rub. No gallop.  Pulmonary:     Effort: Pulmonary effort is normal. No respiratory distress.     Breath sounds: Normal breath sounds.  Abdominal:     Palpations: Abdomen is soft.     Tenderness: There is no abdominal tenderness. There is no guarding or rebound.     Hernia: No hernia is present.  Musculoskeletal:        General: No swelling.     Cervical back: Full passive range of motion without pain, normal range of motion and neck supple. No pain with movement, spinous process tenderness or muscular tenderness. Normal range of motion.     Right lower leg: No edema.     Left lower leg: No edema.  Skin:    General: Skin is warm and dry.     Capillary Refill: Capillary refill takes less than 2 seconds.     Findings: No ecchymosis, erythema, lesion or wound.  Neurological:     Mental Status: He is alert and oriented to person, place, and time.     GCS: GCS eye subscore is 4. GCS verbal subscore is 5. GCS motor subscore is 6.     Cranial  Nerves: Cranial nerves 2-12 are intact.     Sensory: Sensation is intact.     Motor: Motor function is intact. No weakness or abnormal muscle tone.     Coordination: Coordination is intact.  Psychiatric:        Mood and Affect: Mood normal.        Speech: Speech normal.        Behavior: Behavior normal.     ED Results / Procedures / Treatments   Labs (all labs ordered are listed, but only abnormal results are displayed) Labs Reviewed - No data to display  EKG None  Radiology No results found.  Procedures Procedures    Medications Ordered in ED Medications  hyoscyamine (LEVSIN SL) SL tablet 0.25 mg (0.25 mg Sublingual Given 05/04/23 0525)  alum & mag hydroxide-simeth (MAALOX/MYLANTA) 200-200-20 MG/5ML suspension 30 mL (30 mLs Oral Given 05/04/23 0525)  famotidine (PEPCID) tablet 40 mg (40 mg Oral Given 05/04/23 0525)    ED Course/ Medical Decision Making/ A&P                                  Medical Decision Making Risk OTC drugs. Prescription drug management.   Differential Diagnosis considered includes, but not limited to: Cholelithiasis; cholecystitis; cholangitis; bowel obstruction; esophagitis; gastritis; peptic ulcer disease; pancreatitis; cardiac.   Presents to the emergency department for evaluation of GERD.  He has a history of similar symptoms in the past requiring ER treatment.  No hematemesis or melena.  Vital signs stable, no signs of bleeding.  Imaging in the past has not shown any abnormalities.  Patient administered occasions and is improving.  He is awaiting insurance coverage before he can follow-up with GI.  He is instructed to take Prilosec twice a day for at least a week, then daily.  He will take Pepcid twice a day for a week and was prescribed Carafate.  Can use liquid antacids as needed.        Final Clinical Impression(s) / ED Diagnoses Final diagnoses:  Gastroesophageal reflux disease, unspecified whether esophagitis present    Rx / DC Orders ED Discharge Orders          Ordered    famotidine (PEPCID) 20 MG tablet  2 times daily        05/04/23 0604    sucralfate (CARAFATE) 1 g tablet  3 times daily with meals & bedtime        05/04/23 0604              Gilda Crease, MD 05/04/23 [phone]

## 2023-06-14 ENCOUNTER — Emergency Department (HOSPITAL_COMMUNITY)
Admission: EM | Admit: 2023-06-14 | Discharge: 2023-06-14 | Disposition: A | Discharge status: Home / Self Care | Payer: Self-pay | Attending: Emergency Medicine | Admitting: Emergency Medicine

## 2023-06-14 ENCOUNTER — Other Ambulatory Visit: Payer: Self-pay

## 2023-06-14 ENCOUNTER — Encounter (HOSPITAL_COMMUNITY): Payer: Self-pay | Admitting: Emergency Medicine

## 2023-06-14 DIAGNOSIS — X58XXXA Exposure to other specified factors, initial encounter: Secondary | ICD-10-CM | POA: Insufficient documentation

## 2023-06-14 DIAGNOSIS — S39012A Strain of muscle, fascia and tendon of lower back, initial encounter: Secondary | ICD-10-CM

## 2023-06-14 MED ORDER — IBUPROFEN 600 MG PO TABS: 600.0000 mg | ORAL_TABLET | Freq: Three times a day (TID) | ORAL | 0 refills | Status: DC | PRN

## 2023-06-14 MED ORDER — IBUPROFEN 400 MG PO TABS
600.0000 mg | ORAL_TABLET | Freq: Once | ORAL | Status: AC
  Administered 2023-06-14: 600 mg via ORAL
  Filled 2023-06-14: qty 2

## 2023-06-14 MED ORDER — CYCLOBENZAPRINE HCL 10 MG PO TABS: 10.0000 mg | ORAL_TABLET | Freq: Three times a day (TID) | ORAL | 0 refills | Status: DC | PRN

## 2023-06-14 MED ORDER — CYCLOBENZAPRINE HCL 10 MG PO TABS
10.0000 mg | ORAL_TABLET | Freq: Once | ORAL | Status: AC
  Administered 2023-06-14: 10 mg via ORAL
  Filled 2023-06-14: qty 1

## 2023-06-14 NOTE — ED Notes (Signed)
Patient verbalizes understanding of discharge instructions. Opportunity for questioning and answers were provided. Armband removed by staff, pt discharged from ED. Ambulated out to lobby  

## 2023-06-14 NOTE — ED Provider Notes (Signed)
[place] EMERGENCY DEPARTMENT AT [facility] Provider Note   CSN: [id number] Arrival date & time: 06/14/23  0113     History  Chief Complaint  Patient presents with   Back Pain    Matthew Alvarez is a 32 y.o. male.  The history is provided by the patient.  Back Pain He complains of mid back pain which started yesterday.  His job does involve a lot of lifting, and he has had similar pains in the past.  He has applied IcyHot without any relief.  Pain does not radiate from his back.  He denies any weakness or numbness.  He denies any bowel or bladder dysfunction.   Home Medications Prior to Admission medications   Medication Sig Start Date End Date Taking? Authorizing Provider  cyclobenzaprine (FLEXERIL) 10 MG tablet Take 1 tablet (10 mg total) by mouth 3 (three) times daily as needed for muscle spasms. 06/14/23  Yes Dione Booze, MD  ibuprofen (ADVIL) 600 MG tablet Take 1 tablet (600 mg total) by mouth every 8 (eight) hours as needed. 06/14/23  Yes Dione Booze, MD  famotidine (PEPCID) 20 MG tablet Take 1 tablet (20 mg total) by mouth 2 (two) times daily. 05/04/23   Gilda Crease, MD  omeprazole (PRILOSEC) 20 MG capsule Take 1 capsule (20 mg total) by mouth daily. 03/16/23   Zadie Rhine, MD  sucralfate (CARAFATE) 1 g tablet Take 1 tablet (1 g total) by mouth 4 (four) times daily -  with meals and at bedtime. 05/04/23   Gilda Crease, MD      Allergies    Clindamycin/lincomycin and Penicillins    Review of Systems   Review of Systems  Musculoskeletal:  Positive for back pain.  All other systems reviewed and are negative.   Physical Exam Updated Vital Signs BP (!) 142/78 (BP Location: Right Arm)   Pulse 67   Temp 98.8 F (37.1 C) (Oral)   Resp 16   Ht 5\' 9"  (1.753 m)   Wt 112.5 kg   SpO2 98%   BMI 36.62 kg/m  Physical Exam Vitals and nursing note reviewed.   32 year old male, resting comfortably and in no acute distress. Vital signs  are significant for borderline elevated blood pressure. Oxygen saturation is 98%, which is normal. Head is normocephalic and atraumatic. PERRLA, EOMI. Oropharynx is clear. Neck is nontender and supple. Back is nontender and there is no CVA tenderness.  There is moderate bilateral paralumbar spasm.  Straight leg raise is positive bilaterally at 60 degrees. Lungs are clear without rales, wheezes, or rhonchi. Chest is nontender. Heart has regular rate and rhythm without murmur. Abdomen is soft, flat, nontender. Extremities have no cyanosis or edema, full range of motion is present. Skin is warm and dry without rash. Neurologic: Mental status is normal, strength is 5/5 in all 4 extremities.  There are no sensory deficits.  ED Results / Procedures / Treatments    Procedures Procedures    Medications Ordered in ED Medications  ibuprofen (ADVIL) tablet 600 mg (has no administration in time range)  cyclobenzaprine (FLEXERIL) tablet 10 mg (has no administration in time range)    ED Course/ Medical Decision Making/ A&P                                 Medical Decision Making Risk Prescription drug management.   Acute lumbar strain.  No indication for imaging.  I have reviewed his past records, and he had ED visits on 01/05/2023 and 12/10/2022 for lumbar strain.  He had been given prescriptions for ibuprofen and cyclobenzaprine and had gotten good relief with those medications.  I am discharging him with prescriptions for ibuprofen and cyclobenzaprine, advised him to apply ice.  I have also instructed him on lifting techniques and given him back exercises to do once his back is feeling better.  Final Clinical Impression(s) / ED Diagnoses Final diagnoses:  Strain of lumbar region, initial encounter    Rx / DC Orders ED Discharge Orders          Ordered    cyclobenzaprine (FLEXERIL) 10 MG tablet  3 times daily PRN        06/14/23 0409    ibuprofen (ADVIL) 600 MG tablet  Every 8 hours  PRN        06/14/23 0409              Dione Booze, MD 06/14/23 [phone]

## 2023-06-14 NOTE — ED Triage Notes (Addendum)
Pt with c/o upper and missle back. Denies injury. States has had before. Pt ambulatory to triage.

## 2023-07-12 ENCOUNTER — Emergency Department (HOSPITAL_COMMUNITY)
Admission: EM | Admit: 2023-07-12 | Discharge: 2023-07-12 | Disposition: A | Discharge status: Home / Self Care | Payer: Self-pay | Attending: Emergency Medicine | Admitting: Emergency Medicine

## 2023-07-12 ENCOUNTER — Other Ambulatory Visit: Payer: Self-pay

## 2023-07-12 ENCOUNTER — Encounter (HOSPITAL_COMMUNITY): Payer: Self-pay | Admitting: Emergency Medicine

## 2023-07-12 DIAGNOSIS — R1013 Epigastric pain: Secondary | ICD-10-CM | POA: Insufficient documentation

## 2023-07-12 DIAGNOSIS — I10 Essential (primary) hypertension: Secondary | ICD-10-CM | POA: Insufficient documentation

## 2023-07-12 LAB — COMPREHENSIVE METABOLIC PANEL
ALT: 27 U/L (ref 0–44)
AST: 26 U/L (ref 15–41)
Albumin: 3.7 g/dL (ref 3.5–5.0)
Alkaline Phosphatase: 82 U/L (ref 38–126)
Anion gap: 8 (ref 5–15)
BUN: 9 mg/dL (ref 6–20)
CO2: 26 mmol/L (ref 22–32)
Calcium: 9 mg/dL (ref 8.9–10.3)
Chloride: 103 mmol/L (ref 98–111)
Creatinine, Ser: 0.97 mg/dL (ref 0.61–1.24)
GFR, Estimated: >60 mL/min (ref >60)
Glucose, Bld: 102 mg/dL — ABNORMAL HIGH (ref 70–99)
Potassium: 3.7 mmol/L (ref 3.5–5.1)
Sodium: 137 mmol/L (ref 135–145)
Total Bilirubin: 0.4 mg/dL (ref 0.0–1.2)
Total Protein: 7.2 g/dL (ref 6.5–8.1)

## 2023-07-12 LAB — CBC WITH DIFFERENTIAL/PLATELET
Abs Immature Granulocytes: 0.02 10*3/uL (ref 0.00–0.07)
Basophils Absolute: 0 10*3/uL (ref 0.0–0.1)
Basophils Relative: 0 %
Eosinophils Absolute: 0.3 10*3/uL (ref 0.0–0.5)
Eosinophils Relative: 3 %
HCT: 44.2 % (ref 39.0–52.0)
Hemoglobin: 14.8 g/dL (ref 13.0–17.0)
Immature Granulocytes: 0 %
Lymphocytes Relative: 41 %
Lymphs Abs: 3.8 10*3/uL (ref 0.7–4.0)
MCH: 28.7 pg (ref 26.0–34.0)
MCHC: 33.5 g/dL (ref 30.0–36.0)
MCV: 85.8 fL (ref 80.0–100.0)
Monocytes Absolute: 0.7 10*3/uL (ref 0.1–1.0)
Monocytes Relative: 8 %
Neutro Abs: 4.4 10*3/uL (ref 1.7–7.7)
Neutrophils Relative %: 48 %
Platelets: 256 10*3/uL (ref 150–400)
RBC: 5.15 MIL/uL (ref 4.22–5.81)
RDW: 13.1 % (ref 11.5–15.5)
WBC: 9.2 10*3/uL (ref 4.0–10.5)
nRBC: 0 % (ref 0.0–0.2)

## 2023-07-12 LAB — LIPASE, BLOOD: Lipase: 30 U/L (ref 11–51)

## 2023-07-12 MED ORDER — SUCRALFATE 1 G PO TABS: 1.0000 g | ORAL_TABLET | Freq: Three times a day (TID) | ORAL | 0 refills | Status: DC

## 2023-07-12 MED ORDER — ALUM & MAG HYDROXIDE-SIMETH 200-200-20 MG/5ML PO SUSP
30.0000 mL | Freq: Once | ORAL | Status: AC
  Administered 2023-07-12: 30 mL via ORAL
  Filled 2023-07-12: qty 30

## 2023-07-12 MED ORDER — DICYCLOMINE HCL 10 MG PO CAPS
10.0000 mg | ORAL_CAPSULE | Freq: Once | ORAL | Status: AC
  Administered 2023-07-12: 10 mg via ORAL
  Filled 2023-07-12: qty 1

## 2023-07-12 MED ORDER — MAALOX MAX 400-400-40 MG/5ML PO SUSP: 10.0000 mL | Freq: Three times a day (TID) | ORAL | 0 refills | Status: DC | PRN

## 2023-07-12 MED ORDER — LIDOCAINE VISCOUS HCL 2 % MT SOLN
15.0000 mL | Freq: Once | OROMUCOSAL | Status: AC
  Administered 2023-07-12: 15 mL via ORAL
  Filled 2023-07-12: qty 15

## 2023-07-12 NOTE — ED Triage Notes (Addendum)
Pt with c/o abdominal pain that started last night. Pt has hx of GERD and has taken OTC medications without relief.

## 2023-07-12 NOTE — ED Provider Notes (Signed)
[place] EMERGENCY DEPARTMENT AT [facility] Provider Note  CSN: [id number] Arrival date & time: 07/12/23 4098  Chief Complaint(s) Abdominal Pain  HPI Matthew Alvarez is a 33 y.o. male with past medical history as below, significant for GERD, hypertension who presents to the ED with complaint of abdominal pain  He has chronic indigestion, has been taking omeprazole without much relief.  No recent diet or medication changes.  His not yet followed up with gastroenterology after his prior ER visit.  No daily alcohol use, no daily tobacco use.  No vomiting, no hematemesis, no blood in stool or melena.  He reports that this feels similar to indigestion he has experienced in the past  Past Medical History Past Medical History:  Diagnosis Date   GERD (gastroesophageal reflux disease)    Hypertension    Patient Active Problem List   Diagnosis Date Noted   CLOSED FRACTURE OF NECK OF RADIUS 07/09/2010   Home Medication(s) Prior to Admission medications   Medication Sig Start Date End Date Taking? Authorizing Provider  alum & mag hydroxide-simeth (MAALOX MAX) 400-400-40 MG/5ML suspension Take 10 mLs by mouth every 8 (eight) hours as needed for indigestion. 07/12/23  Yes Tanda Rockers A, DO  sucralfate (CARAFATE) 1 g tablet Take 1 tablet (1 g total) by mouth with breakfast, with lunch, and with evening meal for 7 days. 07/12/23 07/19/23 Yes Sloan Leiter, DO  cyclobenzaprine (FLEXERIL) 10 MG tablet Take 1 tablet (10 mg total) by mouth 3 (three) times daily as needed for muscle spasms. 06/14/23   Dione Booze, MD  famotidine (PEPCID) 20 MG tablet Take 1 tablet (20 mg total) by mouth 2 (two) times daily. 05/04/23   Gilda Crease, MD  omeprazole (PRILOSEC) 20 MG capsule Take 1 capsule (20 mg total) by mouth daily. 03/16/23   Zadie Rhine, MD                                                                                                                                    Past  Surgical History Past Surgical History:  Procedure Laterality Date   TONSILLECTOMY AND ADENOIDECTOMY     Family History History reviewed. No pertinent family history.  Social History Social History   Tobacco Use   Smoking status: Never   Smokeless tobacco: Never  Vaping Use   Vaping status: Never Used  Substance Use Topics   Alcohol use: Yes    Comment: occ. use   Drug use: No   Allergies Clindamycin/lincomycin and Penicillins  Review of Systems Review of Systems  Constitutional:  Negative for chills.  HENT:  Negative for congestion.   Respiratory:  Negative for chest tightness and shortness of breath.   Cardiovascular:  Negative for chest pain and palpitations.  Gastrointestinal:  Positive for abdominal pain. Negative for blood in stool, nausea and vomiting.  Genitourinary:  Negative for dysuria.  Musculoskeletal:  Negative for arthralgias.  All other systems reviewed and are negative.   Physical Exam Vital Signs  I have reviewed the triage vital signs BP (!) 139/97   Pulse (!) 58   Temp 97.8 F (36.6 C) (Oral)   Resp 16   Ht 5\' 9"  (1.753 m)   Wt 112 kg   SpO2 97%   BMI 36.46 kg/m  Physical Exam Vitals and nursing note reviewed.  Constitutional:      General: He is not in acute distress.    Appearance: Normal appearance. He is well-developed. He is not ill-appearing.  HENT:     Head: Normocephalic and atraumatic.     Right Ear: External ear normal.     Left Ear: External ear normal.     Nose: Nose normal.     Mouth/Throat:     Mouth: Mucous membranes are moist.  Eyes:     General: No scleral icterus.       Right eye: No discharge.        Left eye: No discharge.  Cardiovascular:     Rate and Rhythm: Normal rate.  Pulmonary:     Effort: Pulmonary effort is normal. No respiratory distress.     Breath sounds: No stridor.  Abdominal:     General: Abdomen is flat. There is no distension.     Tenderness: There is abdominal tenderness in the epigastric  area. There is no guarding or rebound. Negative signs include Murphy's sign.  Musculoskeletal:        General: No deformity.     Cervical back: No rigidity.  Skin:    General: Skin is warm and dry.     Coloration: Skin is not cyanotic, jaundiced or pale.  Neurological:     Mental Status: He is alert and oriented to person, place, and time.     GCS: GCS eye subscore is 4. GCS verbal subscore is 5. GCS motor subscore is 6.  Psychiatric:        Speech: Speech normal.        Behavior: Behavior normal. Behavior is cooperative.     ED Results and Treatments Labs (all labs ordered are listed, but only abnormal results are displayed) Labs Reviewed  COMPREHENSIVE METABOLIC PANEL - Abnormal; Notable for the following components:      Result Value   Glucose, Bld 102 (*)    All other components within normal limits  CBC WITH DIFFERENTIAL/PLATELET  LIPASE, BLOOD                                                                                                                          Radiology No results found.  Pertinent labs & imaging results that were available during my care of the patient were reviewed by me and considered in my medical decision making (see MDM for details).  Medications Ordered in ED Medications  alum & mag hydroxide-simeth (MAALOX/MYLANTA) 200-200-20 MG/5ML suspension 30 mL (30 mLs Oral Given 07/12/23 0515)    And  lidocaine (XYLOCAINE) 2 % viscous mouth solution 15 mL (15 mLs Oral Given 07/12/23 0515)  dicyclomine (BENTYL) capsule 10 mg (10 mg Oral Given 07/12/23 0514)                                                                                                                                     Procedures Procedures  (including critical care time)  Medical Decision Making / ED Course    Medical Decision Making:    Matthew Alvarez is a 33 y.o. male with past medical history as below, significant for GERD, hypertension who presents to the ED with complaint  of abdominal pain. The complaint involves an extensive differential diagnosis and also carries with it a high risk of complications and morbidity.  Serious etiology was considered. Ddx includes but is not limited to: Differential diagnosis includes but is not exclusive to acute cholecystitis, intrathoracic causes for epigastric abdominal pain, gastritis, duodenitis, pancreatitis, small bowel or large bowel obstruction, abdominal aortic aneurysm, hernia, gastritis, etc.   Complete initial physical exam performed, notably the patient was in no distress, abdomen is nonperitoneal.    Reviewed and confirmed nursing documentation for past medical history, family history, social history.  Vital signs reviewed.         Brief summary: 33 year old male with history as above here with epigastric pain.  Burning sensation, feels like indigestion.  No evidence of bleeding.  Will check screening labs given recurrence of symptoms.  Give GI cocktail.  Labs stable, symptoms resolved. Favor gastritis vs pud, no evidence bleeding. Resume PPI, start carafate, bland diet, f/u gi    The patient improved significantly and was discharged in stable condition. Detailed discussions were had with the patient/guardian regarding current findings, and need for close f/u with PCP or on call doctor. The patient/guardian has been instructed to return immediately if the symptoms worsen in any way for re-evaluation. Patient/guardian verbalized understanding and is in agreement with current care plan. All questions answered prior to discharge.                  Additional history obtained: -Additional history obtained from na -External records from outside source obtained and reviewed including: Chart review including previous notes, labs, imaging, consultation notes including  Prior ED visits, home medications, prior labs and imaging   Lab Tests: -I ordered, reviewed, and interpreted labs.   The pertinent  results include:   Labs Reviewed  COMPREHENSIVE METABOLIC PANEL - Abnormal; Notable for the following components:      Result Value   Glucose, Bld 102 (*)    All other components within normal limits  CBC WITH DIFFERENTIAL/PLATELET  LIPASE, BLOOD    Notable for stable  EKG   EKG Interpretation Date/Time:    Ventricular Rate:    PR Interval:    QRS Duration:    QT Interval:    QTC Calculation:   R Axis:  Text Interpretation:           Imaging Studies ordered: na   Medicines ordered and prescription drug management: Meds ordered this encounter  Medications   AND Linked Order Group    alum & mag hydroxide-simeth (MAALOX/MYLANTA) 200-200-20 MG/5ML suspension 30 mL    lidocaine (XYLOCAINE) 2 % viscous mouth solution 15 mL   dicyclomine (BENTYL) capsule 10 mg   sucralfate (CARAFATE) 1 g tablet    Sig: Take 1 tablet (1 g total) by mouth with breakfast, with lunch, and with evening meal for 7 days.    Dispense:  21 tablet    Refill:  0   alum & mag hydroxide-simeth (MAALOX MAX) 400-400-40 MG/5ML suspension    Sig: Take 10 mLs by mouth every 8 (eight) hours as needed for indigestion.    Dispense:  355 mL    Refill:  0    -I have reviewed the patients home medicines and have made adjustments as needed   Consultations Obtained:   Cardiac Monitoring: Continuous pulse oximetry interpreted by myself, 100% on RA.    Social Determinants of Health:  Diagnosis or treatment significantly limited by social determinants of health: obesity   Reevaluation: After the interventions noted above, I reevaluated the patient and found that they have improved  Co morbidities that complicate the patient evaluation  Past Medical History:  Diagnosis Date   GERD (gastroesophageal reflux disease)    Hypertension       Dispostion: Disposition decision including need for hospitalization was considered, and patient discharged from emergency department.    Final Clinical  Impression(s) / ED Diagnoses Final diagnoses:  Epigastric pain        Sloan Leiter, DO 07/18/23 2250

## 2023-07-12 NOTE — Discharge Instructions (Addendum)
It was a pleasure caring for you today in the emergency department.  Be sure to eat a very bland diet.  Continue taking your omeprazole.   Avoid alcohol and tobacco. Call gastroenterology to arrange follow-up.   Please return to the emergency department for any worsening or worrisome symptoms.

## 2023-07-22 ENCOUNTER — Other Ambulatory Visit: Payer: Self-pay

## 2023-07-22 ENCOUNTER — Encounter (HOSPITAL_COMMUNITY): Payer: Self-pay

## 2023-07-22 ENCOUNTER — Emergency Department (HOSPITAL_COMMUNITY)
Admission: EM | Admit: 2023-07-22 | Discharge: 2023-07-22 | Disposition: A | Discharge status: Home / Self Care | Payer: Self-pay | Attending: Emergency Medicine | Admitting: Emergency Medicine

## 2023-07-22 DIAGNOSIS — J020 Streptococcal pharyngitis: Secondary | ICD-10-CM | POA: Insufficient documentation

## 2023-07-22 DIAGNOSIS — I10 Essential (primary) hypertension: Secondary | ICD-10-CM | POA: Insufficient documentation

## 2023-07-22 DIAGNOSIS — Z20822 Contact with and (suspected) exposure to covid-19: Secondary | ICD-10-CM | POA: Insufficient documentation

## 2023-07-22 DIAGNOSIS — J3489 Other specified disorders of nose and nasal sinuses: Secondary | ICD-10-CM | POA: Insufficient documentation

## 2023-07-22 DIAGNOSIS — Z79899 Other long term (current) drug therapy: Secondary | ICD-10-CM | POA: Insufficient documentation

## 2023-07-22 LAB — RESP PANEL BY RT-PCR (RSV, FLU A&B, COVID)  RVPGX2
Influenza A by PCR: NEGATIVE
Influenza B by PCR: NEGATIVE
Resp Syncytial Virus by PCR: NEGATIVE
SARS Coronavirus 2 by RT PCR: NEGATIVE

## 2023-07-22 LAB — GROUP A STREP BY PCR: Group A Strep by PCR: DETECTED — AB

## 2023-07-22 MED ORDER — AZITHROMYCIN 250 MG PO TABS
500.0000 mg | ORAL_TABLET | Freq: Once | ORAL | Status: AC
  Administered 2023-07-22: 500 mg via ORAL
  Filled 2023-07-22: qty 2

## 2023-07-22 MED ORDER — AZITHROMYCIN 250 MG PO TABS: 250.0000 mg | ORAL_TABLET | Freq: Every day | ORAL | 0 refills | Status: DC

## 2023-07-22 NOTE — Discharge Instructions (Signed)
 Take your next dose of the antibiotic tomorrow morning as you received today's dose here.  Rest to make sure you are drinking plenty of fluids, I recommend taking Tylenol  or ibuprofen  to help you with fever and sore throat pain relief.

## 2023-07-22 NOTE — ED Provider Notes (Signed)
 [place] EMERGENCY DEPARTMENT AT [facility] Provider Note   CSN: [id number] Arrival date & time: 07/22/23  9074     History  Chief Complaint  Patient presents with   Sore Throat    Matthew Alvarez is a 33 y.o. male with a history of hypertension and GERD presenting with a 2-day history of flulike symptoms including generalized sore throat, rhinorrhea, chills and bodyaches which have been present for the past 2 days.  He also endorses bilateral ear pain without ear discharge or reduced hearing acuity.  He has had low-grade temps to around 100.0.  He has had a nonproductive cough, he denies chest pain, denies shortness of breath, no nausea vomiting or abdominal pain.  He has taken OTC TheraFlu like products without significant symptom relief.  Of note he was seen at [facility] yesterday and had a negative respiratory panel.  He returns as he does not feel any better and does not feel he can return to work at this time.  The history is provided by the patient.       Home Medications Prior to Admission medications   Medication Sig Start Date End Date Taking? Authorizing Provider  azithromycin  (ZITHROMAX ) 250 MG tablet Take 1 tablet (250 mg total) by mouth daily. Take one tablet daily for 4 days 07/23/23  Yes Rionna Feltes, PA-C  alum & mag hydroxide-simeth (MAALOX MAX) 400-400-40 MG/5ML suspension Take 10 mLs by mouth every 8 (eight) hours as needed for indigestion. 07/12/23   Elnor Jayson LABOR, DO  cyclobenzaprine  (FLEXERIL ) 10 MG tablet Take 1 tablet (10 mg total) by mouth 3 (three) times daily as needed for muscle spasms. 06/14/23   Raford Lenis, MD  famotidine  (PEPCID ) 20 MG tablet Take 1 tablet (20 mg total) by mouth 2 (two) times daily. 05/04/23   Haze Lonni PARAS, MD  omeprazole  (PRILOSEC) 20 MG capsule Take 1 capsule (20 mg total) by mouth daily. 03/16/23   Midge Golas, MD  sucralfate  (CARAFATE ) 1 g tablet Take 1 tablet (1 g total) by mouth with breakfast, with  lunch, and with evening meal for 7 days. 07/12/23 07/19/23  Elnor Jayson LABOR, DO      Allergies    Clindamycin /lincomycin and Penicillins    Review of Systems   Review of Systems  Constitutional:  Positive for chills, fatigue and fever.  HENT:  Positive for rhinorrhea and sore throat. Negative for congestion.   Eyes: Negative.   Respiratory:  Positive for cough. Negative for chest tightness and shortness of breath.   Cardiovascular:  Negative for chest pain.  Gastrointestinal:  Negative for abdominal pain, nausea and vomiting.  Genitourinary: Negative.   Musculoskeletal:  Positive for myalgias. Negative for arthralgias, joint swelling and neck pain.  Skin: Negative.  Negative for rash and wound.  Neurological:  Negative for dizziness, weakness, light-headedness, numbness and headaches.  Psychiatric/Behavioral: Negative.    All other systems reviewed and are negative.   Physical Exam Updated Vital Signs Ht 5' 9 (1.753 m)   Wt 112 kg   BMI 36.46 kg/m  Physical Exam Constitutional:      Appearance: He is well-developed.  HENT:     Head: Normocephalic and atraumatic.     Right Ear: Tympanic membrane and ear canal normal.     Left Ear: Tympanic membrane and ear canal normal.     Nose: Rhinorrhea present.     Mouth/Throat:     Mouth: Mucous membranes are moist.     Pharynx: Oropharynx is  clear. Uvula midline. Posterior oropharyngeal erythema present. No pharyngeal swelling, oropharyngeal exudate or uvula swelling.     Tonsils: No tonsillar abscesses.     Comments: No head or neck adenopathy appreciated Eyes:     Conjunctiva/sclera: Conjunctivae normal.  Cardiovascular:     Rate and Rhythm: Normal rate.     Heart sounds: Normal heart sounds.  Pulmonary:     Effort: Pulmonary effort is normal. No respiratory distress.     Breath sounds: Normal breath sounds. No wheezing, rhonchi or rales.  Musculoskeletal:        General: Normal range of motion.  Skin:    General: Skin is warm  and dry.     Findings: No rash.  Neurological:     Mental Status: He is alert and oriented to person, place, and time.     ED Results / Procedures / Treatments   Labs (all labs ordered are listed, but only abnormal results are displayed) Labs Reviewed  GROUP A STREP BY PCR - Abnormal; Notable for the following components:      Result Value   Group A Strep by PCR DETECTED (*)    All other components within normal limits  RESP PANEL BY RT-PCR (RSV, FLU A&B, COVID)  [id number]    EKG None  Radiology No results found.  Procedures Procedures    Medications Ordered in ED Medications  azithromycin  (ZITHROMAX ) tablet 500 mg (has no administration in time range)    ED Course/ Medical Decision Making/ A&P                                 Medical Decision Making Patient testing positive for strep pharyngitis.  Differential diagnosis including viral illness, RSV, influenza or viral pharyngitis.  Exam is reassuring without any signs suggesting peritonsillar abscess.  Since he is pen allergic he is placed on Zithromax , we discussed home care, return precautions..  Follow-up anticipated.  Amount and/or Complexity of Data Reviewed Labs: ordered.    Details: Respiratory panel is negative, group A strep positive.  Risk Prescription drug management.           Final Clinical Impression(s) / ED Diagnoses Final diagnoses:  Strep pharyngitis    Rx / DC Orders ED Discharge Orders          Ordered    azithromycin  (ZITHROMAX ) 250 MG tablet  Daily        07/22/23 1143              Birdena Clarity, DEVONNA 07/22/23 1146    Yolande Lamar BROCKS, MD 07/23/23 1419

## 2023-07-22 NOTE — ED Triage Notes (Signed)
Pt arrived via POV from home c/o sore throat, fatigue, chills X 2 days.

## 2023-08-14 ENCOUNTER — Emergency Department (HOSPITAL_COMMUNITY)
Admission: EM | Admit: 2023-08-14 | Discharge: 2023-08-14 | Disposition: A | Discharge status: Home / Self Care | Payer: Self-pay | Attending: Emergency Medicine | Admitting: Emergency Medicine

## 2023-08-14 ENCOUNTER — Emergency Department (HOSPITAL_COMMUNITY): Payer: Self-pay

## 2023-08-14 ENCOUNTER — Other Ambulatory Visit: Payer: Self-pay

## 2023-08-14 ENCOUNTER — Encounter (HOSPITAL_COMMUNITY): Payer: Self-pay

## 2023-08-14 DIAGNOSIS — U071 COVID-19: Secondary | ICD-10-CM | POA: Insufficient documentation

## 2023-08-14 LAB — RESP PANEL BY RT-PCR (RSV, FLU A&B, COVID)  RVPGX2
Influenza A by PCR: NEGATIVE
Influenza B by PCR: NEGATIVE
Resp Syncytial Virus by PCR: NEGATIVE
SARS Coronavirus 2 by RT PCR: POSITIVE — AB

## 2023-08-14 MED ORDER — BENZONATATE 100 MG PO CAPS: 200.0000 mg | ORAL_CAPSULE | Freq: Three times a day (TID) | ORAL | 0 refills | Status: DC | PRN

## 2023-08-14 MED ORDER — IBUPROFEN 400 MG PO TABS
400.0000 mg | ORAL_TABLET | Freq: Once | ORAL | Status: AC
  Administered 2023-08-14: 400 mg via ORAL
  Filled 2023-08-14: qty 1

## 2023-08-14 NOTE — ED Notes (Signed)
 ED Provider at bedside.

## 2023-08-14 NOTE — Discharge Instructions (Addendum)
 Rest make sure you are drinking plenty of fluids.  You will need to self quarantine through Tuesday as you are potentially contagious as discussed.  I recommend taking Tylenol or ibuprofen to help you with your headache and generalized body aches and any fever.  I have also prescribed you medicine to help you with your cough as needed.

## 2023-08-14 NOTE — ED Triage Notes (Signed)
 Pt wants flu test due to fever, chills, fatigue and sore throat.

## 2023-08-14 NOTE — ED Provider Notes (Signed)
 [place] EMERGENCY DEPARTMENT AT [facility] Provider Note   CSN: [id number] Arrival date & time: 08/14/23  1346     History  No chief complaint on file.   Matthew Alvarez is a 33 y.o. male who is seen here at the beginning of this month by me, diagnosed with strep throat and completed a course of Zithromax, returning today with complaints of flulike symptoms which started today at work.  He endorses generalized fatigue along with chills and subjective fever, he denies sore throat, also denies chest pain but has had some shortness of breath with a nonproductive cough.  No abdominal pain, no nausea or vomiting.  He last took a dose of ibuprofen earlier this morning.  He states his son is currently ill with a febrile illness, he was tested at an urgent care and was negative for flu and COVID.    The history is provided by the patient.       Home Medications Prior to Admission medications   Medication Sig Start Date End Date Taking? Authorizing Provider  benzonatate (TESSALON) 100 MG capsule Take 2 capsules (200 mg total) by mouth 3 (three) times daily as needed. 08/14/23  Yes Mingo Siegert, Raynelle Fanning, PA-C  alum & mag hydroxide-simeth (MAALOX MAX) 400-400-40 MG/5ML suspension Take 10 mLs by mouth every 8 (eight) hours as needed for indigestion. 07/12/23   Sloan Leiter, DO  azithromycin (ZITHROMAX) 250 MG tablet Take 1 tablet (250 mg total) by mouth daily. Take one tablet daily for 4 days 07/23/23   Burgess Amor, PA-C  cyclobenzaprine (FLEXERIL) 10 MG tablet Take 1 tablet (10 mg total) by mouth 3 (three) times daily as needed for muscle spasms. 06/14/23   Dione Booze, MD  famotidine (PEPCID) 20 MG tablet Take 1 tablet (20 mg total) by mouth 2 (two) times daily. 05/04/23   Gilda Crease, MD  omeprazole (PRILOSEC) 20 MG capsule Take 1 capsule (20 mg total) by mouth daily. 03/16/23   Zadie Rhine, MD  sucralfate (CARAFATE) 1 g tablet Take 1 tablet (1 g total) by mouth with breakfast,  with lunch, and with evening meal for 7 days. 07/12/23 07/19/23  Sloan Leiter, DO      Allergies    Clindamycin/lincomycin and Penicillins    Review of Systems   Review of Systems  Constitutional:  Positive for chills and fever.  HENT:  Negative for congestion, ear pain, rhinorrhea, sinus pressure, sore throat, trouble swallowing and voice change.   Eyes:  Negative for discharge.  Respiratory:  Positive for cough and shortness of breath. Negative for wheezing and stridor.   Cardiovascular:  Negative for chest pain.  Gastrointestinal:  Negative for abdominal pain, nausea and vomiting.  Genitourinary: Negative.   Neurological:  Positive for weakness.    Physical Exam Updated Vital Signs BP 124/69   Pulse (!) 47   Temp 100.2 F (37.9 C) (Oral)   Ht 5\' 9"  (1.753 m)   Wt 109.8 kg   SpO2 95%   BMI 35.74 kg/m  Physical Exam Vitals and nursing note reviewed.  Constitutional:      Appearance: He is well-developed.  HENT:     Head: Normocephalic and atraumatic.     Mouth/Throat:     Mouth: Mucous membranes are moist.     Pharynx: No oropharyngeal exudate or posterior oropharyngeal erythema.  Eyes:     Conjunctiva/sclera: Conjunctivae normal.  Cardiovascular:     Rate and Rhythm: Regular rhythm. Bradycardia present.  Heart sounds: Normal heart sounds.     Comments: Documented bradycardia in triage at rate 47, obtained orthostatics and his pulse rates stayed in the 60s. Pulmonary:     Effort: Pulmonary effort is normal.     Breath sounds: Normal breath sounds. No wheezing or rhonchi.  Abdominal:     General: Bowel sounds are normal.     Palpations: Abdomen is soft.     Tenderness: There is no abdominal tenderness.  Musculoskeletal:        General: Normal range of motion.     Cervical back: Normal range of motion.  Skin:    General: Skin is warm and dry.  Neurological:     General: No focal deficit present.     Mental Status: He is alert.     ED Results / Procedures  / Treatments   Labs (all labs ordered are listed, but only abnormal results are displayed) Labs Reviewed  RESP PANEL BY RT-PCR (RSV, FLU A&B, COVID)  RVPGX2 - Abnormal; Notable for the following components:      Result Value   SARS Coronavirus 2 by RT PCR POSITIVE (*)    All other components within normal limits    EKG None  Radiology DG Chest Portable 1 View Result Date: 08/14/2023 CLINICAL DATA:  Shortness of breath.  COVID positive. EXAM: PORTABLE CHEST 1 VIEW COMPARISON:  Radiographs 10/02/2022 and 09/02/2022. FINDINGS: 1717 hours. The heart size and mediastinal contours are normal. The lungs are clear. There is no pleural effusion or pneumothorax. No acute osseous findings are identified. IMPRESSION: No evidence of active cardiopulmonary process. Electronically Signed   By: Carey Bullocks M.D.   On: 08/14/2023 17:38    Procedures Procedures    Medications Ordered in ED Medications  ibuprofen (ADVIL) tablet 400 mg (400 mg Oral Given 08/14/23 1724)    ED Course/ Medical Decision Making/ A&P                                 Medical Decision Making Patient with flulike symptoms, fever, cough, generalized fatigue testing positive for COVID-19 today.  He is not immunized for COVID-19, he will need a 5-day quarantine which we discussed.  Also discussed home treatment so, strict return precautions.  Patient would not benefit from Paxlovid,  low risk pt.   Amount and/or Complexity of Data Reviewed Labs: ordered.    Details: Respiratory panel positive for COVID-19 Radiology: ordered.    Details: Chest x-ray negative for pneumonia.  Risk OTC drugs. Prescription drug management.           Final Clinical Impression(s) / ED Diagnoses Final diagnoses:  COVID-19    Rx / DC Orders ED Discharge Orders          Ordered    benzonatate (TESSALON) 100 MG capsule  3 times daily PRN        08/14/23 1749              Burgess Amor, PA-C 08/14/23 1800    Terrilee Files, MD 08/15/23 1112

## 2023-08-18 ENCOUNTER — Encounter (HOSPITAL_COMMUNITY): Payer: Self-pay | Admitting: *Deleted

## 2023-08-18 ENCOUNTER — Emergency Department (HOSPITAL_COMMUNITY)
Admission: EM | Admit: 2023-08-18 | Discharge: 2023-08-18 | Disposition: A | Discharge status: Home / Self Care | Payer: Self-pay | Attending: Emergency Medicine | Admitting: Emergency Medicine

## 2023-08-18 ENCOUNTER — Other Ambulatory Visit: Payer: Self-pay

## 2023-08-18 DIAGNOSIS — Z79899 Other long term (current) drug therapy: Secondary | ICD-10-CM | POA: Insufficient documentation

## 2023-08-18 DIAGNOSIS — U071 COVID-19: Secondary | ICD-10-CM | POA: Insufficient documentation

## 2023-08-18 DIAGNOSIS — I10 Essential (primary) hypertension: Secondary | ICD-10-CM | POA: Insufficient documentation

## 2023-08-18 LAB — RESP PANEL BY RT-PCR (RSV, FLU A&B, COVID)  RVPGX2
Influenza A by PCR: NEGATIVE
Influenza B by PCR: NEGATIVE
Resp Syncytial Virus by PCR: NEGATIVE
SARS Coronavirus 2 by RT PCR: POSITIVE — AB

## 2023-08-18 NOTE — ED Provider Notes (Signed)
 [place] EMERGENCY DEPARTMENT AT [facility] Provider Note   CSN: [id number] Arrival date & time: 08/18/23  1308     History  No chief complaint on file.   Matthew Alvarez is a 33 y.o. male.  He reports history of hypertension and GERD.  Presents to ER complaining of cough congestion headaches, started on four 8/28.  He tested positive for COVID-19 in the ED and has been out of work.  He presents today asking to be retested to see if he is still positive due to ongoing symptoms to see if he could go back to work or not.  He denies fever or chills, no chest pain or shortness of breath, no nausea or vomiting.  He reports he also has a child at home with COVID.  HPI     Home Medications Prior to Admission medications   Medication Sig Start Date End Date Taking? Authorizing Provider  alum & mag hydroxide-simeth (MAALOX MAX) 400-400-40 MG/5ML suspension Take 10 mLs by mouth every 8 (eight) hours as needed for indigestion. 07/12/23   Sloan Leiter, DO  azithromycin (ZITHROMAX) 250 MG tablet Take 1 tablet (250 mg total) by mouth daily. Take one tablet daily for 4 days 07/23/23   Burgess Amor, PA-C  benzonatate (TESSALON) 100 MG capsule Take 2 capsules (200 mg total) by mouth 3 (three) times daily as needed. 08/14/23   Burgess Amor, PA-C  cyclobenzaprine (FLEXERIL) 10 MG tablet Take 1 tablet (10 mg total) by mouth 3 (three) times daily as needed for muscle spasms. 06/14/23   Dione Booze, MD  famotidine (PEPCID) 20 MG tablet Take 1 tablet (20 mg total) by mouth 2 (two) times daily. 05/04/23   Gilda Crease, MD  omeprazole (PRILOSEC) 20 MG capsule Take 1 capsule (20 mg total) by mouth daily. 03/16/23   Zadie Rhine, MD  sucralfate (CARAFATE) 1 g tablet Take 1 tablet (1 g total) by mouth with breakfast, with lunch, and with evening meal for 7 days. 07/12/23 07/19/23  Sloan Leiter, DO      Allergies    Clindamycin/lincomycin and Penicillins    Review of Systems   Review of  Systems  Physical Exam Updated Vital Signs BP 138/89 (BP Location: Right Arm)   Pulse 65   Temp 98.1 F (36.7 C) (Oral)   Resp 16   Ht 5\' 9"  (1.753 m)   Wt 109.7 kg   SpO2 99%   BMI 35.71 kg/m  Physical Exam Vitals and nursing note reviewed.  Constitutional:      General: He is not in acute distress.    Appearance: He is well-developed.  HENT:     Head: Normocephalic and atraumatic.     Mouth/Throat:     Mouth: Mucous membranes are moist.  Eyes:     Extraocular Movements: Extraocular movements intact.     Conjunctiva/sclera: Conjunctivae normal.     Pupils: Pupils are equal, round, and reactive to light.  Cardiovascular:     Rate and Rhythm: Normal rate and regular rhythm.     Heart sounds: No murmur heard. Pulmonary:     Effort: Pulmonary effort is normal. No respiratory distress.     Breath sounds: Normal breath sounds.  Abdominal:     Palpations: Abdomen is soft.     Tenderness: There is no abdominal tenderness.  Musculoskeletal:        General: No swelling.     Cervical back: Neck supple.  Skin:    General: Skin  is warm and dry.     Capillary Refill: Capillary refill takes less than 2 seconds.  Neurological:     General: No focal deficit present.     Mental Status: He is alert and oriented to person, place, and time.  Psychiatric:        Mood and Affect: Mood normal.        Behavior: Behavior normal.     ED Results / Procedures / Treatments   Labs (all labs ordered are listed, but only abnormal results are displayed) Labs Reviewed  RESP PANEL BY RT-PCR (RSV, FLU A&B, COVID)  RVPGX2 - Abnormal; Notable for the following components:      Result Value   SARS Coronavirus 2 by RT PCR POSITIVE (*)    All other components within normal limits    EKG None  Radiology No results found.  Procedures Procedures    Medications Ordered in ED Medications - No data to display  ED Course/ Medical Decision Making/ A&P                                  Medical Decision Making DDx: COVID-19, pneumonia, postviral syndrome, other  ED course: Patient tested positive four days ago for COVID, he is still having symptoms of cough, congestion and headaches. We discussed that hs is likely still having a viral symptoms, continue OTC medication, oral hydration and rest.    We discussed that symptoms could last up to 10 days, if he has to go in public should wear a mask, continue practicing frequent handwashing, we also discussed that PCR test can pick up dead virus so returning for another test to see if he is still positive would likely be unhelpful, can use home antigen test he wants.    He has reassuring vitals, reassuring exam. I do not feel he needs further labs or imaging at this time.  He was given strict return precautions.  Amount and/or Complexity of Data Reviewed External Data Reviewed: notes. Labs: ordered.    Details: COVID+           Final Clinical Impression(s) / ED Diagnoses Final diagnoses:  COVID-19    Rx / DC Orders ED Discharge Orders     None         Ma Rings, PA-C 08/18/23 0955    Terrilee Files, MD 08/18/23 1752

## 2023-08-18 NOTE — ED Triage Notes (Signed)
 Pt states he needs to be retested for covid due to his employer request  Pt was seen here 5 days ago and tested positive  Pt still c/o headache with nasal congestion

## 2023-08-18 NOTE — Discharge Instructions (Addendum)
 You are seen in the ER today for COVID testing to see if you are still positive.  Test was still positive, he only tested positive initially 4 days ago, he can take up to 10 days to clear the virus especially since you are stopping headaches and cough I recommend staying home, wear a mask if you go out in public, drink plenty of fluids and rest, come back to the ER if you have new or worsening symptoms.  You want to retest using a home test, that can help determine if you are still contagious.  The PCR test you are can test positive long after you clear the virus.

## 2023-09-27 ENCOUNTER — Encounter (HOSPITAL_COMMUNITY): Payer: Self-pay | Admitting: *Deleted

## 2023-09-27 ENCOUNTER — Emergency Department (HOSPITAL_COMMUNITY)
Admission: EM | Admit: 2023-09-27 | Discharge: 2023-09-27 | Disposition: A | Discharge status: Home / Self Care | Payer: Self-pay | Attending: Emergency Medicine | Admitting: Emergency Medicine

## 2023-09-27 ENCOUNTER — Emergency Department (HOSPITAL_COMMUNITY): Payer: Self-pay

## 2023-09-27 ENCOUNTER — Other Ambulatory Visit: Payer: Self-pay

## 2023-09-27 DIAGNOSIS — M545 Low back pain, unspecified: Secondary | ICD-10-CM | POA: Insufficient documentation

## 2023-09-27 MED ORDER — NAPROXEN 250 MG PO TABS
500.0000 mg | ORAL_TABLET | Freq: Once | ORAL | Status: AC
  Administered 2023-09-27: 500 mg via ORAL
  Filled 2023-09-27: qty 2

## 2023-09-27 MED ORDER — METHOCARBAMOL 750 MG PO TABS: 750.0000 mg | ORAL_TABLET | Freq: Three times a day (TID) | ORAL | 0 refills | Status: DC

## 2023-09-27 MED ORDER — DEXAMETHASONE SODIUM PHOSPHATE 10 MG/ML IJ SOLN
10.0000 mg | Freq: Once | INTRAMUSCULAR | Status: DC
  Filled 2023-09-27: qty 1

## 2023-09-27 MED ORDER — KETOROLAC TROMETHAMINE 15 MG/ML IJ SOLN
15.0000 mg | Freq: Once | INTRAMUSCULAR | Status: DC
  Filled 2023-09-27: qty 1

## 2023-09-27 MED ORDER — METHOCARBAMOL 500 MG PO TABS
750.0000 mg | ORAL_TABLET | Freq: Once | ORAL | Status: AC
  Administered 2023-09-27: 750 mg via ORAL
  Filled 2023-09-27: qty 2

## 2023-09-27 MED ORDER — NAPROXEN 500 MG PO TABS: 500.0000 mg | ORAL_TABLET | Freq: Two times a day (BID) | ORAL | 0 refills | Status: DC

## 2023-09-27 NOTE — Discharge Instructions (Signed)
 He is follow-up closely with a primary care doctor on an outpatient basis.  Return to emergency department immediately for any new or worsening symptoms.

## 2023-09-27 NOTE — ED Triage Notes (Signed)
 Pt with lower back pain for past week.  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  Pt has tried Icy Hot cream and Ibuprofen.

## 2023-09-27 NOTE — ED Provider Notes (Signed)
 [place] EMERGENCY DEPARTMENT AT [facility] Provider Note   CSN: [id number] Arrival date & time: 09/27/23  2054     History  Chief Complaint  Patient presents with   Back Pain    Matthew Alvarez is a 33 y.o. male.  Patient is a 33 year old male who presents to the emergency department with a chief complaint of low back pain which has been ongoing for approximate the past week.  Patient notes he does work a physical job and does a lot of heavy lifting.  Patient denies any recent falls or blunt trauma to his back.  He denies any urinary bowel incontinence, saddle paresthesias, gait changes, fever, chills, history of IV drug use, history HIV, history of cancer, history chronic steroid use.  Patient has had no abdominal pain, nausea, vomiting, diarrhea.  He has had no dysuria or hematuria.   Back Pain      Home Medications Prior to Admission medications   Medication Sig Start Date End Date Taking? Authorizing Provider  alum & mag hydroxide-simeth (MAALOX MAX) 400-400-40 MG/5ML suspension Take 10 mLs by mouth every 8 (eight) hours as needed for indigestion. 07/12/23   Teddi Favors, DO  azithromycin (ZITHROMAX) 250 MG tablet Take 1 tablet (250 mg total) by mouth daily. Take one tablet daily for 4 days 07/23/23   Idol, Julie, PA-C  benzonatate (TESSALON) 100 MG capsule Take 2 capsules (200 mg total) by mouth 3 (three) times daily as needed. 08/14/23   Idol, Julie, PA-C  cyclobenzaprine (FLEXERIL) 10 MG tablet Take 1 tablet (10 mg total) by mouth 3 (three) times daily as needed for muscle spasms. 06/14/23   Alissa April, MD  famotidine (PEPCID) 20 MG tablet Take 1 tablet (20 mg total) by mouth 2 (two) times daily. 05/04/23   Ballard Bongo, MD  omeprazole (PRILOSEC) 20 MG capsule Take 1 capsule (20 mg total) by mouth daily. 03/16/23   Eldon Greenland, MD  sucralfate (CARAFATE) 1 g tablet Take 1 tablet (1 g total) by mouth with breakfast, with lunch, and with evening meal  for 7 days. 07/12/23 07/19/23  Teddi Favors, DO      Allergies    Clindamycin/lincomycin and Penicillins    Review of Systems   Review of Systems  Musculoskeletal:  Positive for back pain.  All other systems reviewed and are negative.   Physical Exam Updated Vital Signs BP (!) 158/90   Pulse 79   Temp 98.2 F (36.8 C) (Oral)   Resp 17   Ht 5\' 9"  (1.753 m)   Wt 115.2 kg   SpO2 99%   BMI 37.51 kg/m  Physical Exam Vitals and nursing note reviewed.  Constitutional:      Appearance: Normal appearance.  HENT:     Head: Normocephalic and atraumatic.     Nose: Nose normal.     Mouth/Throat:     Mouth: Mucous membranes are moist.  Eyes:     Extraocular Movements: Extraocular movements intact.     Conjunctiva/sclera: Conjunctivae normal.     Pupils: Pupils are equal, round, and reactive to light.  Cardiovascular:     Rate and Rhythm: Normal rate and regular rhythm.     Pulses: Normal pulses.     Heart sounds: Normal heart sounds.  Pulmonary:     Effort: Pulmonary effort is normal.     Breath sounds: Normal breath sounds.  Abdominal:     General: Abdomen is flat. Bowel sounds are normal. There is no  distension.     Palpations: Abdomen is soft. There is no mass.     Tenderness: There is no abdominal tenderness. There is no guarding.     Hernia: No hernia is present.  Musculoskeletal:        General: Normal range of motion.     Cervical back: Normal range of motion and neck supple.     Comments: Tenderness palpation of the lower lumbar spine and paraspinous muscles, no step-off or deformity, nontender palpation over thoracic spine, nontender palpation of remainder of long bones and joints, DP and PT pulses are 2+ in bilateral lower extremities  Skin:    General: Skin is warm and dry.  Neurological:     General: No focal deficit present.     Mental Status: He is alert and oriented to person, place, and time. Mental status is at baseline.     Cranial Nerves: No cranial nerve  deficit.     Sensory: No sensory deficit.     Motor: No weakness.     Coordination: Coordination normal.     Gait: Gait normal.     Comments: Extension bilateral great toes intact, extension and flexion at the hips intact  Psychiatric:        Mood and Affect: Mood normal.        Behavior: Behavior normal.        Thought Content: Thought content normal.        Judgment: Judgment normal.     ED Results / Procedures / Treatments   Labs (all labs ordered are listed, but only abnormal results are displayed) Labs Reviewed - No data to display  EKG None  Radiology No results found.  Procedures Procedures    Medications Ordered in ED Medications  ketorolac (TORADOL) 15 MG/ML injection 15 mg (has no administration in time range)  dexamethasone (DECADRON) injection 10 mg (has no administration in time range)    ED Course/ Medical Decision Making/ A&P                                 Medical Decision Making Patient is doing well at this time and is stable for discharge home.  Discussed with patient that x-ray of the lumbar spine demonstrated no signs of acute osseous injury or lesion.  Patient is TUANFISH negative and has no concerning neurological deficits.  Do not suspect cauda equina syndrome, vertebral osteomyelitis, epidural abscess.  Abdominal exam is benign with no focal tenderness throughout.  Do not suspect an acute intra-abdominal surgical process at this time and do not suspect pyelonephritis or kidney stones.  The need for close follow-up with her primary care doctor on an outpatient basis was discussed.  Suspect muscle strain at this time.  Will continue symptomatic treatment on outpatient basis.  Patient voiced understanding to the plan and had no additional questions.  Strict return precautions were discussed for any new or worsening symptoms.  Amount and/or Complexity of Data Reviewed Radiology: ordered.  Risk Prescription drug management.           Final  Clinical Impression(s) / ED Diagnoses Final diagnoses:  None    Rx / DC Orders ED Discharge Orders     None         Matthew Alvarez 09/27/23 2202    Cheyenne Cotta, MD 09/28/23 1035

## 2023-11-29 ENCOUNTER — Other Ambulatory Visit: Payer: Self-pay

## 2023-11-29 ENCOUNTER — Emergency Department (HOSPITAL_COMMUNITY)
Admission: EM | Admit: 2023-11-29 | Discharge: 2023-11-30 | Disposition: A | Discharge status: Home / Self Care | Payer: Self-pay | Attending: Emergency Medicine | Admitting: Emergency Medicine

## 2023-11-29 DIAGNOSIS — I10 Essential (primary) hypertension: Secondary | ICD-10-CM | POA: Insufficient documentation

## 2023-11-29 DIAGNOSIS — R1013 Epigastric pain: Secondary | ICD-10-CM | POA: Insufficient documentation

## 2023-11-29 NOTE — ED Triage Notes (Signed)
 Pt c/o generalized abdominal pain, reports hx of GERD. Has been taking omeprazole  with minimal relief

## 2023-11-30 LAB — COMPREHENSIVE METABOLIC PANEL WITH GFR
ALT: 22 U/L (ref 0–44)
AST: 24 U/L (ref 15–41)
Albumin: 3.7 g/dL (ref 3.5–5.0)
Alkaline Phosphatase: 95 U/L (ref 38–126)
Anion gap: 9 (ref 5–15)
BUN: 11 mg/dL (ref 6–20)
CO2: 25 mmol/L (ref 22–32)
Calcium: 8.7 mg/dL — ABNORMAL LOW (ref 8.9–10.3)
Chloride: 103 mmol/L (ref 98–111)
Creatinine, Ser: 0.94 mg/dL (ref 0.61–1.24)
GFR, Estimated: >60 mL/min (ref >60)
Glucose, Bld: 104 mg/dL — ABNORMAL HIGH (ref 70–99)
Potassium: 3.7 mmol/L (ref 3.5–5.1)
Sodium: 137 mmol/L (ref 135–145)
Total Bilirubin: 0.5 mg/dL (ref 0.0–1.2)
Total Protein: 7.2 g/dL (ref 6.5–8.1)

## 2023-11-30 LAB — LIPASE, BLOOD: Lipase: 36 U/L (ref 11–51)

## 2023-11-30 LAB — CBC
HCT: 42.4 % (ref 39.0–52.0)
Hemoglobin: 14 g/dL (ref 13.0–17.0)
MCH: 28.1 pg (ref 26.0–34.0)
MCHC: 33 g/dL (ref 30.0–36.0)
MCV: 85 fL (ref 80.0–100.0)
Platelets: 292 10*3/uL (ref 150–400)
RBC: 4.99 MIL/uL (ref 4.22–5.81)
RDW: 13.2 % (ref 11.5–15.5)
WBC: 9.6 10*3/uL (ref 4.0–10.5)
nRBC: 0 % (ref 0.0–0.2)

## 2023-11-30 MED ORDER — FAMOTIDINE 20 MG PO TABS
20.0000 mg | ORAL_TABLET | Freq: Once | ORAL | Status: AC
  Administered 2023-11-30: 20 mg via ORAL
  Filled 2023-11-30: qty 1

## 2023-11-30 MED ORDER — SUCRALFATE 1 G PO TABS: 1.0000 g | ORAL_TABLET | Freq: Four times a day (QID) | ORAL | 0 refills | Status: DC | PRN

## 2023-11-30 MED ORDER — FAMOTIDINE 20 MG PO TABS
20.0000 mg | ORAL_TABLET | Freq: Two times a day (BID) | ORAL | 0 refills | Status: DC
Start: 2023-11-30 — End: 2023-12-02

## 2023-11-30 MED ORDER — ACETAMINOPHEN 500 MG PO TABS
1000.0000 mg | ORAL_TABLET | Freq: Once | ORAL | Status: AC
  Administered 2023-11-30: 1000 mg via ORAL
  Filled 2023-11-30: qty 2

## 2023-11-30 MED ORDER — ALUM & MAG HYDROXIDE-SIMETH 200-200-20 MG/5ML PO SUSP
30.0000 mL | Freq: Once | ORAL | Status: AC
  Administered 2023-11-30: 30 mL via ORAL
  Filled 2023-11-30: qty 30

## 2023-11-30 NOTE — ED Provider Notes (Signed)
 [facility] Emergency Department Provider Note MRN:  [id number]  Arrival date & time: 11/30/23     Chief Complaint   Abdominal Pain   History of Present Illness   Matthew Alvarez is a 33 y.o. year-old male with a history of hypertension, GERD presenting to the ED with chief complaint of abdominal pain.  Epigastric abdominal pain feels similar to prior issues with his GERD, flaring up this morning and has been bothering him all day.  Took his morning omeprazole  like normal, does not really have anything else at home to use for the discomfort.  Seem to be worse after eating lunch.  Denies nausea vomiting, no diarrhea, no other complaints.  Review of Systems  A thorough review of systems was obtained and all systems are negative except as noted in the HPI and PMH.   Patient's Health History    Past Medical History:  Diagnosis Date   GERD (gastroesophageal reflux disease)    Hypertension     Past Surgical History:  Procedure Laterality Date   TONSILLECTOMY AND ADENOIDECTOMY      No family history on file.  Social History   Socioeconomic History   Marital status: Divorced    Spouse name: Not on file   Number of children: Not on file   Years of education: Not on file   Highest education level: Not on file  Occupational History   Not on file  Tobacco Use   Smoking status: Never   Smokeless tobacco: Never  Vaping Use   Vaping status: Never Used  Substance and Sexual Activity   Alcohol use: Yes    Comment: occ. use   Drug use: No   Sexual activity: Yes    Birth control/protection: None  Other Topics Concern   Not on file  Social History Narrative   Not on file   Social Drivers of Health   Financial Resource Strain: Not on file  Food Insecurity: Not on file  Transportation Needs: Not on file  Physical Activity: Not on file  Stress: Not on file  Social Connections: Not on file  Intimate Partner Violence: Not on file     Physical Exam    Vitals:   11/29/23 2342 11/30/23 0030  BP: 139/78 130/89  Pulse: 61 (!) 51  Resp: 16 17  Temp: 97.7 F (36.5 C)   SpO2: 99% 98%    CONSTITUTIONAL: Well-appearing, NAD NEURO/PSYCH:  Alert and oriented x 3, no focal deficits EYES:  eyes equal and reactive ENT/NECK:  no LAD, no JVD CARDIO: Regular rate, well-perfused, normal S1 and S2 PULM:  CTAB no wheezing or rhonchi GI/GU:  non-distended, non-tender MSK/SPINE:  No gross deformities, no edema SKIN:  no rash, atraumatic   *Additional and/or pertinent findings included in MDM below  Diagnostic and Interventional Summary    EKG Interpretation Date/Time:    Ventricular Rate:    PR Interval:    QRS Duration:    QT Interval:    QTC Calculation:   R Axis:      Text Interpretation:         Labs Reviewed  COMPREHENSIVE METABOLIC PANEL WITH GFR - Abnormal; Notable for the following components:      Result Value   Glucose, Bld 104 (*)    Calcium 8.7 (*)    All other components within normal limits  LIPASE, BLOOD  CBC  URINALYSIS, ROUTINE W REFLEX MICROSCOPIC    No orders to display    Medications  alum &  mag hydroxide-simeth (MAALOX/MYLANTA) 200-200-20 MG/5ML suspension 30 mL (30 mLs Oral Given 11/30/23 0037)  acetaminophen  (TYLENOL ) tablet 1,000 mg (1,000 mg Oral Given 11/30/23 0036)  famotidine  (PEPCID ) tablet 20 mg (20 mg Oral Given 11/30/23 0037)     Procedures  /  Critical Care Procedures  ED Course and Medical Decision Making  Initial Impression and Ddx Well-appearing, no acute distress, vitals normal, abdomen soft and nontender with no rebound guarding or rigidity.  Favoring GERD, pancreatitis also considered as is biliary colic though these are felt to be less likely.  Screening labs, symptom control, reassess.  Past medical/surgical history that increases complexity of ED encounter: GERD  Interpretation of Diagnostics I personally reviewed the Laboratory Testing and my interpretation is as follows: No  significant blood count or electrolyte disturbance.    Patient Reassessment and Ultimate Disposition/Management     Patient feeling a bit better, vitals normal, abdomen continues to be soft and nontender.  No indication for further testing or admission, appropriate for discharge.  Patient management required discussion with the following services or consulting groups:  None  Complexity of Problems Addressed Acute illness or injury that poses threat of life of bodily function  Additional Data Reviewed and Analyzed Further history obtained from: Prior labs/imaging results  Additional Factors Impacting ED Encounter Risk Prescriptions  Merrick Abe. Harless Lien, MD [facility] Emergency Medicine [facility] [email or web address]  Final Clinical Impressions(s) / ED Diagnoses     ICD-10-CM   1. Epigastric pain  R10.13       ED Discharge Orders          Ordered    sucralfate  (CARAFATE ) 1 g tablet  4 times daily PRN        11/30/23 0117    famotidine  (PEPCID ) 20 MG tablet  2 times daily        11/30/23 0117             Discharge Instructions Discussed with and Provided to Patient:     Discharge Instructions      You were evaluated in the Emergency Department and after careful evaluation, we did not find any emergent condition requiring admission or further testing in the hospital.  Your exam/testing today is overall reassuring.  Symptoms likely due to a flare of your GERD.  Recommend use of the omeprazole  medication daily.  Can actually take this in the morning as well as at night for the next 3 days to help with this flareup.  Can use the Carafate  4 times daily as needed for more immediate relief.  Can use the Pepcid  twice daily as needed.  Follow-up with your primary care doctor.  Please return to the Emergency Department if you experience any worsening of your condition.   Thank you for allowing us  to be a part of your care.       Edson Graces,  MD 11/30/23 [phone]

## 2023-11-30 NOTE — ED Notes (Signed)
 ED Provider at bedside.

## 2023-11-30 NOTE — Discharge Instructions (Signed)
 You were evaluated in the Emergency Department and after careful evaluation, we did not find any emergent condition requiring admission or further testing in the hospital.  Your exam/testing today is overall reassuring.  Symptoms likely due to a flare of your GERD.  Recommend use of the omeprazole  medication daily.  Can actually take this in the morning as well as at night for the next 3 days to help with this flareup.  Can use the Carafate  4 times daily as needed for more immediate relief.  Can use the Pepcid  twice daily as needed.  Follow-up with your primary care doctor.  Please return to the Emergency Department if you experience any worsening of your condition.   Thank you for allowing us  to be a part of your care.

## 2023-12-02 ENCOUNTER — Telehealth: Payer: Self-pay | Admitting: *Deleted

## 2023-12-02 ENCOUNTER — Encounter: Payer: Self-pay | Admitting: Gastroenterology

## 2023-12-02 ENCOUNTER — Ambulatory Visit (INDEPENDENT_AMBULATORY_CARE_PROVIDER_SITE_OTHER): Payer: Self-pay | Admitting: Gastroenterology

## 2023-12-02 VITALS — BP 138/85 | HR 66 | Temp 97.5°F | Ht 69.0 in | Wt 250.6 lb

## 2023-12-02 DIAGNOSIS — G8929 Other chronic pain: Secondary | ICD-10-CM | POA: Insufficient documentation

## 2023-12-02 DIAGNOSIS — R1013 Epigastric pain: Secondary | ICD-10-CM

## 2023-12-02 MED ORDER — PANTOPRAZOLE SODIUM 40 MG PO TBEC: 40.0000 mg | DELAYED_RELEASE_TABLET | Freq: Every day | ORAL | 3 refills | Status: AC

## 2023-12-02 NOTE — H&P (View-Only) (Signed)
 Gastroenterology Office Note    Referring Provider: [facility] Primary Care Physician:  Patient, No Pcp Per  Primary GI: Dr. Mordechai April    Chief Complaint   Chief Complaint  Patient presents with   Follow-up    Patient here today due to a recent visit to [facility] on 11/29/2023 due to epigastric pain. He says they told him he had a Gerd flare. He was started on Omeprazole  20 mg once per day, and also Carafate  1g which he is taking 3-4 times per day. He says this has improved the pain slightly,but not completely.      History of Present Illness   EPHRAIM REICHEL is a 33 y.o. male presenting today at the request of [facility] due to acute on chronic epigastric pain. No prior EGD.   Several year history of intermittent epigastric pain, usually every few weeks and lasting from a few hours to next day. No N/V. No GERD symptoms. Has noted worsening with any type of sauces (BBQ, spaghetti, sodas, mtn dew pepsi, tea). He has been on omeprazole  chronically. No dysphagia.   Recently with acute onset of burning in abdomen, unsure if from what he ate. Epigastric. Had eaten a banana, [employer], one soda. Mcchicken meal with sweet tea. Went to the ED on 6/15. CBC, CMP, lipase all unrevealing.   No N/V. No GERD symptoms. Has noted any type of sauces (BBQ, spaghetti, sodas, mtn dew pepsi, tea) will trigger symptoms.   Now on omeprazole  and carafate  from ED. Had already been on omeprazole  20 mg daily. Has been on chronically. Hasn't tried other PPIs.   Occasional Ibuprofen . No aspirin powders.   No dysphagia. No changes in bowel habits. Noted light red blood with wiping about 3 months ago while straining. No other instances.      Past Medical History:  Diagnosis Date   GERD (gastroesophageal reflux disease)    Hypertension     Past Surgical History:  Procedure Laterality Date   TONSILLECTOMY AND ADENOIDECTOMY      Current Outpatient Medications  Medication Sig Dispense Refill    omeprazole  (PRILOSEC) 20 MG capsule Take 1 capsule (20 mg total) by mouth daily. 30 capsule 0   pantoprazole  (PROTONIX ) 40 MG tablet Take 1 tablet (40 mg total) by mouth daily. 30 minutes before breakfast 90 tablet 3   sucralfate  (CARAFATE ) 1 g tablet Take 1 tablet (1 g total) by mouth 4 (four) times daily as needed. 30 tablet 0   No current facility-administered medications for this visit.    Allergies as of 12/02/2023 - Review Complete 12/02/2023  Allergen Reaction Noted   Clindamycin /lincomycin  05/17/2020   Penicillins Swelling 09/22/2012    Family History  Problem Relation Age of Onset   Colon cancer Neg Hx    Colon polyps Neg Hx     Social History   Socioeconomic History   Marital status: Divorced    Spouse name: Not on file   Number of children: 3   Years of education: Not on file   Highest education level: Not on file  Occupational History   Not on file  Tobacco Use   Smoking status: Never   Smokeless tobacco: Never  Vaping Use   Vaping status: Never Used  Substance and Sexual Activity   Alcohol use: Not Currently   Drug use: No   Sexual activity: Yes    Birth control/protection: None  Other Topics Concern   Not on file  Social  History Narrative   Not on file   Social Drivers of Health   Financial Resource Strain: Not on file  Food Insecurity: Not on file  Transportation Needs: Not on file  Physical Activity: Not on file  Stress: Not on file  Social Connections: Not on file  Intimate Partner Violence: Not on file     Review of Systems   Gen: Denies any fever, chills, fatigue, weight loss, lack of appetite.  CV: Denies chest pain, heart palpitations, peripheral edema, syncope.  Resp: Denies shortness of breath at rest or with exertion. Denies wheezing or cough.  GI: Denies dysphagia or odynophagia. Denies jaundice, hematemesis, fecal incontinence. GU : Denies urinary burning, urinary frequency, urinary hesitancy MS: Denies joint pain, muscle  weakness, cramps, or limitation of movement.  Derm: Denies rash, itching, dry skin Psych: Denies depression, anxiety, memory loss, and confusion Heme: Denies bruising, bleeding, and enlarged lymph nodes.   Physical Exam   BP 138/85 (BP Location: Left Arm, Patient Position: Sitting, Cuff Size: Large)   Pulse 66   Temp (!) 97.5 F (36.4 C) (Temporal)   Ht 5' 9 (1.753 m)   Wt 250 lb 9.6 oz (113.7 kg)   BMI 37.01 kg/m  General:   Alert and oriented. Pleasant and cooperative. Well-nourished and well-developed.  Head:  Normocephalic and atraumatic. Eyes:  Without icterus Ears:  Normal auditory acuity. Lungs:  Clear to auscultation bilaterally.  Heart:  S1, S2 present without murmurs appreciated.  Abdomen:  +BS, soft, non-tender and non-distended. No HSM noted. No guarding or rebound. No masses appreciated.  Rectal:  Deferred  Msk:  Symmetrical without gross deformities. Normal posture. Extremities:  Without edema. Neurologic:  Alert and  oriented x4;  grossly normal neurologically. Skin:  Intact without significant lesions or rashes. Psych:  Alert and cooperative. Normal mood and affect.  Lab Results  Component Value Date   WBC 9.6 11/29/2023   HGB 14.0 11/29/2023   HCT 42.4 11/29/2023   MCV 85.0 11/29/2023   PLT 292 11/29/2023   Lab Results  Component Value Date   ALT 22 11/29/2023   AST 24 11/29/2023   ALKPHOS 95 11/29/2023   BILITOT 0.5 11/29/2023   Lab Results  Component Value Date   NA 137 11/29/2023   CL 103 11/29/2023   K 3.7 11/29/2023   CO2 25 11/29/2023   BUN 11 11/29/2023   CREATININE 0.94 11/29/2023   GFRNONAA >60 11/29/2023   CALCIUM 8.7 (L) 11/29/2023   ALBUMIN 3.7 11/29/2023   GLUCOSE 104 (H) 11/29/2023   Lab Results  Component Value Date   LIPASE 36 11/29/2023     Assessment   Matthew Alvarez is a 33 y.o. male presenting today at the request of [facility] due to acute on chronic epigastric pain. No prior GI consultation or EGD.    Epigastric pain: without any typical GERD symptoms, noting intermittent spells despite being on omeprazole  chronically. I suspect symptoms r/t gastritis, atypical GERD, as he notes flares with certain food triggers. No dysphagia. As he has had chronic history of this and presented to ED, we can pursue diagnostic EGD. Doubt biliary as does not seem consistent with this. Labs unrevealing.   One episode of scant tissue hematochezia: noted about 3 months ago while straining. No other episodes. Discussed avoidance of straining, monitor for any recurrence, and will need colonoscopy if he notes this again. No other alarm signs/symptoms.     PLAN   Stop omeprazole , start pantoprazole  once daily  Proceed with upper endoscopy by Dr. Mordechai April in near future: the risks, benefits, and alternatives have been discussed with the patient in detail. The patient states understanding and desires to proceed.   GERD sheet provided, avoid dietary triggers, avoid NSAIDs  Further recommendations to follow   Delman Ferns, PhD, ANP-BC [facility]

## 2023-12-02 NOTE — Patient Instructions (Signed)
 Let's stop omeprazole . Instead, I have sent in pantoprazole  to take once each morning, 30 minutes before breakfast.   You can complete the course of carafate  and then be done.  We are arranging an upper endoscopy with Dr. Mordechai April in the near future!  Please review the attached information on GERD and foods/drinks to avoid.   Further recommendations to follow!  It was a pleasure to see you today. I want to create trusting relationships with patients and provide genuine, compassionate, and quality care. I truly value your feedback, so please be on the lookout for a survey regarding your visit with me today. I appreciate your time in completing this!         Matthew Ferns, PhD, ANP-BC [facility]

## 2023-12-02 NOTE — Telephone Encounter (Signed)
LMOVM to call back to schedule EGD with Dr. Marletta Lor, ASA 2

## 2023-12-02 NOTE — Telephone Encounter (Signed)
 Patient called back. Scheduled for 6/27. Aware will send instructions to his mychart once he becomes active on it. Also updated email.

## 2023-12-02 NOTE — Progress Notes (Signed)
 Gastroenterology Office Note    Referring Provider: [facility] Primary Care Physician:  Patient, No Pcp Per  Primary GI: Dr. Mordechai April    Chief Complaint   Chief Complaint  Patient presents with   Follow-up    Patient here today due to a recent visit to [facility] on 11/29/2023 due to epigastric pain. He says they told him he had a Gerd flare. He was started on Omeprazole  20 mg once per day, and also Carafate  1g which he is taking 3-4 times per day. He says this has improved the pain slightly,but not completely.      History of Present Illness   Matthew Alvarez is a 33 y.o. male presenting today at the request of [facility] due to acute on chronic epigastric pain. No prior EGD.   Several year history of intermittent epigastric pain, usually every few weeks and lasting from a few hours to next day. No N/V. No GERD symptoms. Has noted worsening with any type of sauces (BBQ, spaghetti, sodas, mtn dew pepsi, tea). He has been on omeprazole  chronically. No dysphagia.   Recently with acute onset of burning in abdomen, unsure if from what he ate. Epigastric. Had eaten a banana, [employer], one soda. Mcchicken meal with sweet tea. Went to the ED on 6/15. CBC, CMP, lipase all unrevealing.   No N/V. No GERD symptoms. Has noted any type of sauces (BBQ, spaghetti, sodas, mtn dew pepsi, tea) will trigger symptoms.   Now on omeprazole  and carafate  from ED. Had already been on omeprazole  20 mg daily. Has been on chronically. Hasn't tried other PPIs.   Occasional Ibuprofen . No aspirin powders.   No dysphagia. No changes in bowel habits. Noted light red blood with wiping about 3 months ago while straining. No other instances.      Past Medical History:  Diagnosis Date   GERD (gastroesophageal reflux disease)    Hypertension     Past Surgical History:  Procedure Laterality Date   TONSILLECTOMY AND ADENOIDECTOMY      Current Outpatient Medications  Medication Sig Dispense Refill    omeprazole  (PRILOSEC) 20 MG capsule Take 1 capsule (20 mg total) by mouth daily. 30 capsule 0   pantoprazole  (PROTONIX ) 40 MG tablet Take 1 tablet (40 mg total) by mouth daily. 30 minutes before breakfast 90 tablet 3   sucralfate  (CARAFATE ) 1 g tablet Take 1 tablet (1 g total) by mouth 4 (four) times daily as needed. 30 tablet 0   No current facility-administered medications for this visit.    Allergies as of 12/02/2023 - Review Complete 12/02/2023  Allergen Reaction Noted   Clindamycin /lincomycin  05/17/2020   Penicillins Swelling 09/22/2012    Family History  Problem Relation Age of Onset   Colon cancer Neg Hx    Colon polyps Neg Hx     Social History   Socioeconomic History   Marital status: Divorced    Spouse name: Not on file   Number of children: 3   Years of education: Not on file   Highest education level: Not on file  Occupational History   Not on file  Tobacco Use   Smoking status: Never   Smokeless tobacco: Never  Vaping Use   Vaping status: Never Used  Substance and Sexual Activity   Alcohol use: Not Currently   Drug use: No   Sexual activity: Yes    Birth control/protection: None  Other Topics Concern   Not on file  Social  History Narrative   Not on file   Social Drivers of Health   Financial Resource Strain: Not on file  Food Insecurity: Not on file  Transportation Needs: Not on file  Physical Activity: Not on file  Stress: Not on file  Social Connections: Not on file  Intimate Partner Violence: Not on file     Review of Systems   Gen: Denies any fever, chills, fatigue, weight loss, lack of appetite.  CV: Denies chest pain, heart palpitations, peripheral edema, syncope.  Resp: Denies shortness of breath at rest or with exertion. Denies wheezing or cough.  GI: Denies dysphagia or odynophagia. Denies jaundice, hematemesis, fecal incontinence. GU : Denies urinary burning, urinary frequency, urinary hesitancy MS: Denies joint pain, muscle  weakness, cramps, or limitation of movement.  Derm: Denies rash, itching, dry skin Psych: Denies depression, anxiety, memory loss, and confusion Heme: Denies bruising, bleeding, and enlarged lymph nodes.   Physical Exam   BP 138/85 (BP Location: Left Arm, Patient Position: Sitting, Cuff Size: Large)   Pulse 66   Temp (!) 97.5 F (36.4 C) (Temporal)   Ht 5' 9 (1.753 m)   Wt 250 lb 9.6 oz (113.7 kg)   BMI 37.01 kg/m  General:   Alert and oriented. Pleasant and cooperative. Well-nourished and well-developed.  Head:  Normocephalic and atraumatic. Eyes:  Without icterus Ears:  Normal auditory acuity. Lungs:  Clear to auscultation bilaterally.  Heart:  S1, S2 present without murmurs appreciated.  Abdomen:  +BS, soft, non-tender and non-distended. No HSM noted. No guarding or rebound. No masses appreciated.  Rectal:  Deferred  Msk:  Symmetrical without gross deformities. Normal posture. Extremities:  Without edema. Neurologic:  Alert and  oriented x4;  grossly normal neurologically. Skin:  Intact without significant lesions or rashes. Psych:  Alert and cooperative. Normal mood and affect.  Lab Results  Component Value Date   WBC 9.6 11/29/2023   HGB 14.0 11/29/2023   HCT 42.4 11/29/2023   MCV 85.0 11/29/2023   PLT 292 11/29/2023   Lab Results  Component Value Date   ALT 22 11/29/2023   AST 24 11/29/2023   ALKPHOS 95 11/29/2023   BILITOT 0.5 11/29/2023   Lab Results  Component Value Date   NA 137 11/29/2023   CL 103 11/29/2023   K 3.7 11/29/2023   CO2 25 11/29/2023   BUN 11 11/29/2023   CREATININE 0.94 11/29/2023   GFRNONAA >60 11/29/2023   CALCIUM 8.7 (L) 11/29/2023   ALBUMIN 3.7 11/29/2023   GLUCOSE 104 (H) 11/29/2023   Lab Results  Component Value Date   LIPASE 36 11/29/2023     Assessment   Matthew Alvarez is a 33 y.o. male presenting today at the request of [facility] due to acute on chronic epigastric pain. No prior GI consultation or EGD.    Epigastric pain: without any typical GERD symptoms, noting intermittent spells despite being on omeprazole  chronically. I suspect symptoms r/t gastritis, atypical GERD, as he notes flares with certain food triggers. No dysphagia. As he has had chronic history of this and presented to ED, we can pursue diagnostic EGD. Doubt biliary as does not seem consistent with this. Labs unrevealing.   One episode of scant tissue hematochezia: noted about 3 months ago while straining. No other episodes. Discussed avoidance of straining, monitor for any recurrence, and will need colonoscopy if he notes this again. No other alarm signs/symptoms.     PLAN   Stop omeprazole , start pantoprazole  once daily  Proceed with upper endoscopy by Dr. Mordechai April in near future: the risks, benefits, and alternatives have been discussed with the patient in detail. The patient states understanding and desires to proceed.   GERD sheet provided, avoid dietary triggers, avoid NSAIDs  Further recommendations to follow   Delman Ferns, PhD, ANP-BC [facility]

## 2023-12-11 ENCOUNTER — Ambulatory Visit (HOSPITAL_COMMUNITY): Payer: Self-pay | Admitting: Anesthesiology

## 2023-12-11 ENCOUNTER — Other Ambulatory Visit: Payer: Self-pay

## 2023-12-11 ENCOUNTER — Encounter (HOSPITAL_COMMUNITY): Payer: Self-pay | Admitting: Internal Medicine

## 2023-12-11 ENCOUNTER — Ambulatory Visit (HOSPITAL_COMMUNITY)
Admission: RE | Admit: 2023-12-11 | Discharge: 2023-12-11 | Disposition: A | Discharge status: Home / Self Care | Payer: Self-pay | Attending: Internal Medicine | Admitting: Internal Medicine

## 2023-12-11 ENCOUNTER — Encounter (HOSPITAL_COMMUNITY)
Admission: RE | Disposition: A | Discharge status: Home / Self Care | Payer: Self-pay | Source: Home / Self Care | Attending: Internal Medicine

## 2023-12-11 DIAGNOSIS — K219 Gastro-esophageal reflux disease without esophagitis: Secondary | ICD-10-CM | POA: Insufficient documentation

## 2023-12-11 DIAGNOSIS — I10 Essential (primary) hypertension: Secondary | ICD-10-CM | POA: Insufficient documentation

## 2023-12-11 DIAGNOSIS — K2950 Unspecified chronic gastritis without bleeding: Secondary | ICD-10-CM

## 2023-12-11 DIAGNOSIS — K2970 Gastritis, unspecified, without bleeding: Secondary | ICD-10-CM | POA: Insufficient documentation

## 2023-12-11 HISTORY — PX: ESOPHAGOGASTRODUODENOSCOPY: SHX5428

## 2023-12-11 SURGERY — EGD (ESOPHAGOGASTRODUODENOSCOPY)
Anesthesia: General

## 2023-12-11 MED ORDER — LACTATED RINGERS IV SOLN: INTRAVENOUS | Status: DC

## 2023-12-11 MED ORDER — LIDOCAINE 2% (20 MG/ML) 5 ML SYRINGE
INTRAMUSCULAR | Status: DC | PRN
  Administered 2023-12-11: 60 mg via INTRAVENOUS

## 2023-12-11 MED ORDER — PROPOFOL 10 MG/ML IV BOLUS
INTRAVENOUS | Status: DC | PRN
  Administered 2023-12-11: 80 mg via INTRAVENOUS
  Administered 2023-12-11: 50 mg via INTRAVENOUS
  Administered 2023-12-11: 150 ug/kg/min via INTRAVENOUS

## 2023-12-11 NOTE — Discharge Instructions (Addendum)
 EGD Discharge instructions Please read the instructions outlined below and refer to this sheet in the next few weeks. These discharge instructions provide you with general information on caring for yourself after you leave the hospital. Your doctor may also give you specific instructions. While your treatment has been planned according to the most current medical practices available, unavoidable complications occasionally occur. If you have any problems or questions after discharge, please call your doctor. ACTIVITY You may resume your regular activity but move at a slower pace for the next 24 hours.  Take frequent rest periods for the next 24 hours.  Walking will help expel (get rid of) the air and reduce the bloated feeling in your abdomen.  No driving for 24 hours (because of the anesthesia (medicine) used during the test).  You may shower.  Do not sign any important legal documents or operate any machinery for 24 hours (because of the anesthesia used during the test).  NUTRITION Drink plenty of fluids.  You may resume your normal diet.  Begin with a light meal and progress to your normal diet.  Avoid alcoholic beverages for 24 hours or as instructed by your caregiver.  MEDICATIONS You may resume your normal medications unless your caregiver tells you otherwise.  WHAT YOU CAN EXPECT TODAY You may experience abdominal discomfort such as a feeling of fullness or "gas" pains.  FOLLOW-UP Your doctor will discuss the results of your test with you.  SEEK IMMEDIATE MEDICAL ATTENTION IF ANY OF THE FOLLOWING OCCUR: Excessive nausea (feeling sick to your stomach) and/or vomiting.  Severe abdominal pain and distention (swelling).  Trouble swallowing.  Temperature over 101 F (37.8 C).  Rectal bleeding or vomiting of blood.    Your EGD revealed mild amount inflammation in your stomach.  I took biopsies of this to rule out infection with a bacteria called H. pylori.  Await pathology results, my  office will contact you.  Esophagus and small bowel were normal.   Continue on pantoprazole  daily.  Follow-up in GI office in 6 to 8 weeks.  I hope you have a great rest of your week!  Carlin POUR. Cindie, D.O. Gastroenterology and Hepatology [facility]

## 2023-12-11 NOTE — Anesthesia Preprocedure Evaluation (Signed)
 Anesthesia Evaluation  Patient identified by MRN, date of birth, ID band Patient awake    Reviewed: Allergy & Precautions, H&P , NPO status , Patient's Chart, lab work & pertinent test results, reviewed documented beta blocker date and time   Airway Mallampati: II  TM Distance: >3 FB Neck ROM: full    Dental no notable dental hx.    Pulmonary neg pulmonary ROS   Pulmonary exam normal breath sounds clear to auscultation       Cardiovascular Exercise Tolerance: Good hypertension,  Rhythm:regular Rate:Normal     Neuro/Psych negative neurological ROS  negative psych ROS   GI/Hepatic Neg liver ROS,GERD  ,,  Endo/Other  negative endocrine ROS    Renal/GU negative Renal ROS  negative genitourinary   Musculoskeletal   Abdominal   Peds  Hematology negative hematology ROS (+)   Anesthesia Other Findings   Reproductive/Obstetrics negative OB ROS                             Anesthesia Physical Anesthesia Plan  ASA: 2  Anesthesia Plan: General   Post-op Pain Management:    Induction:   PONV Risk Score and Plan: Propofol infusion  Airway Management Planned:   Additional Equipment:   Intra-op Plan:   Post-operative Plan:   Informed Consent: I have reviewed the patients History and Physical, chart, labs and discussed the procedure including the risks, benefits and alternatives for the proposed anesthesia with the patient or authorized representative who has indicated his/her understanding and acceptance.     Dental Advisory Given  Plan Discussed with: CRNA  Anesthesia Plan Comments:        Anesthesia Quick Evaluation

## 2023-12-11 NOTE — Anesthesia Procedure Notes (Signed)
 Date/Time: 12/11/2023 2:14 PM  Performed by: Para Jerelene CROME, CRNAOxygen Delivery Method: Nasal cannula Comments: OptiFlow Nasal Cannula.

## 2023-12-11 NOTE — Op Note (Signed)
 [facility] Patient Name: Matthew Alvarez Procedure Date: 12/11/2023 2:05 PM MRN: [id number] Date of Birth: 1991-01-07 Attending MD: Carlin POUR. Cindie , [place], [id number] CSN: [id number] Age: 33 Admit Type: Outpatient Procedure:                Upper GI endoscopy Indications:              Epigastric abdominal pain Providers:                Carlin POUR. Cindie, DO, Ashley Goins, Daphne Mulch                            Technician, Technician Referring MD:             Carlin POUR. Cindie, DO Medicines:                See the Anesthesia note for documentation of the                            administered medications Complications:            No immediate complications. Estimated Blood Loss:     Estimated blood loss was minimal. Procedure:                Pre-Anesthesia Assessment:                           - The anesthesia plan was to use monitored                            anesthesia care (MAC).                           After obtaining informed consent, the endoscope was                            passed under direct vision. Throughout the                            procedure, the patient's blood pressure, pulse, and                            oxygen  saturations were monitored continuously. The                            GIF-H190 ([id number]) scope was introduced through the                            mouth, and advanced to the second part of duodenum.                            The upper GI endoscopy was accomplished without                            difficulty. The patient tolerated the procedure                            well.  Scope In: 2:18:53 PM Scope Out: 2:21:55 PM Total Procedure Duration: 0 hours 3 minutes 2 seconds  Findings:      The examined esophagus was normal.      Patchy mild inflammation characterized by erythema was found in the       gastric body and in the gastric antrum. Biopsies were taken with a cold       forceps for Helicobacter pylori testing.      The duodenal  bulb, first portion of the duodenum and second portion of       the duodenum were normal. Impression:               - Normal esophagus.                           - Gastritis. Biopsied.                           - Normal duodenal bulb, first portion of the                            duodenum and second portion of the duodenum. Moderate Sedation:      Per Anesthesia Care Recommendation:           - Patient has a contact number available for                            emergencies. The signs and symptoms of potential                            delayed complications were discussed with the                            patient. Return to normal activities tomorrow.                            Written discharge instructions were provided to the                            patient.                           - Continue present medications.                           - Resume previous diet.                           - Await pathology results.                           - Use Protonix  (pantoprazole ) 40 mg PO daily.                           - Return to GI clinic in 6 weeks. Procedure Code(s):        --- Professional ---  56760, Esophagogastroduodenoscopy, flexible,                            transoral; with biopsy, single or multiple Diagnosis Code(s):        --- Professional ---                           K29.70, Gastritis, unspecified, without bleeding                           R10.13, Epigastric pain CPT copyright 2022 [employer]. All rights reserved. The codes documented in this report are preliminary and upon coder review may  be revised to meet current compliance requirements. Carlin POUR. Cindie, DO Carlin POUR. Cindie, DO 12/11/2023 2:26:00 PM This report has been signed electronically. Number of Addenda: 0

## 2023-12-11 NOTE — Transfer of Care (Signed)
 Immediate Anesthesia Transfer of Care Note  Patient: Matthew Alvarez  Procedure(s) Performed: EGD (ESOPHAGOGASTRODUODENOSCOPY)  Patient Location: Endoscopy Unit  Anesthesia Type:General  Level of Consciousness: drowsy and patient cooperative  Airway & Oxygen  Therapy: Patient Spontanous Breathing  Post-op Assessment: Report given to RN and Post -op Vital signs reviewed and stable  Post vital signs: Reviewed and stable  Last Vitals:  Vitals Value Taken Time  BP 108/58 12/11/23 14:31  Temp 36.8 C 12/11/23 14:26  Pulse 78 12/11/23 14:31  Resp 24 12/11/23 14:31  SpO2 99 % 12/11/23 14:31    Last Pain:  Vitals:   12/11/23 1431  TempSrc:   PainSc: 0-No pain      Patients Stated Pain Goal: 4 (12/11/23 1312)  Complications: No notable events documented.

## 2023-12-14 ENCOUNTER — Encounter (HOSPITAL_COMMUNITY): Payer: Self-pay | Admitting: Internal Medicine

## 2023-12-14 NOTE — Anesthesia Postprocedure Evaluation (Signed)
 Anesthesia Post Note  Patient: Matthew Alvarez  Procedure(s) Performed: EGD (ESOPHAGOGASTRODUODENOSCOPY)  Patient location during evaluation: Endoscopy Anesthesia Type: General Level of consciousness: awake and alert and oriented Pain management: pain level controlled Vital Signs Assessment: post-procedure vital signs reviewed and stable Respiratory status: spontaneous breathing Cardiovascular status: blood pressure returned to baseline Anesthetic complications: no  No notable events documented.   Last Vitals:  Vitals:   12/11/23 1426 12/11/23 1431  BP: (!) 116/54 (!) 108/58  Pulse: 81 78  Resp: 16 (!) 24  Temp: 36.8 C   SpO2: 96% 99%    Last Pain:  Vitals:   12/11/23 1431  TempSrc:   PainSc: 0-No pain                 Belinda L Virgin Zellers

## 2023-12-16 LAB — SURGICAL PATHOLOGY

## 2023-12-22 NOTE — Interval H&P Note (Signed)
 History and Physical Interval Note:  12/22/2023 1:51 PM  Matthew Alvarez  has presented today for surgery, with the diagnosis of dyspepsia.  The various methods of treatment have been discussed with the patient and family. After consideration of risks, benefits and other options for treatment, the patient has consented to  Procedure(s) with comments: EGD (ESOPHAGOGASTRODUODENOSCOPY) (N/A) - 230pm, asa 2 as a surgical intervention.  The patient's history has been reviewed, patient examined, no change in status, stable for surgery.  I have reviewed the patient's chart and labs.  Questions were answered to the patient's satisfaction.     Carlin MARLA Hasty

## 2023-12-29 ENCOUNTER — Ambulatory Visit: Payer: Self-pay | Admitting: Internal Medicine

## 2024-02-28 ENCOUNTER — Other Ambulatory Visit: Payer: Self-pay

## 2024-02-28 ENCOUNTER — Emergency Department (HOSPITAL_COMMUNITY)
Admission: EM | Admit: 2024-02-28 | Discharge: 2024-02-28 | Discharge status: Against Medical Advice | Attending: Emergency Medicine | Admitting: Emergency Medicine

## 2024-02-28 ENCOUNTER — Encounter (HOSPITAL_COMMUNITY): Payer: Self-pay | Admitting: *Deleted

## 2024-02-28 DIAGNOSIS — R519 Headache, unspecified: Secondary | ICD-10-CM | POA: Insufficient documentation

## 2024-02-28 DIAGNOSIS — Z5321 Procedure and treatment not carried out due to patient leaving prior to being seen by health care provider: Secondary | ICD-10-CM | POA: Insufficient documentation

## 2024-02-28 LAB — RESP PANEL BY RT-PCR (RSV, FLU A&B, COVID)  RVPGX2
Influenza A by PCR: NEGATIVE
Influenza B by PCR: NEGATIVE
Resp Syncytial Virus by PCR: NEGATIVE
SARS Coronavirus 2 by RT PCR: NEGATIVE

## 2024-02-28 NOTE — ED Notes (Signed)
 Pt has not been in room x2 visits from edp.

## 2024-02-28 NOTE — ED Triage Notes (Signed)
 Pt with headache since this morning. Pt with possible covid exposure.

## 2024-03-09 ENCOUNTER — Ambulatory Visit: Payer: Self-pay | Admitting: Gastroenterology

## 2024-04-21 ENCOUNTER — Encounter: Payer: Self-pay | Admitting: Gastroenterology

## 2024-04-21 ENCOUNTER — Encounter: Payer: Self-pay | Admitting: *Deleted

## 2024-04-21 ENCOUNTER — Ambulatory Visit (INDEPENDENT_AMBULATORY_CARE_PROVIDER_SITE_OTHER): Payer: Self-pay | Admitting: Gastroenterology

## 2024-04-21 VITALS — BP 123/75 | HR 56 | Temp 97.5°F | Ht 69.0 in | Wt 237.0 lb

## 2024-04-21 DIAGNOSIS — R1010 Upper abdominal pain, unspecified: Secondary | ICD-10-CM

## 2024-04-21 MED ORDER — SUCRALFATE 1 G PO TABS: 1.0000 g | ORAL_TABLET | Freq: Three times a day (TID) | ORAL | 1 refills | Status: DC

## 2024-04-21 NOTE — Progress Notes (Signed)
 Gastroenterology Office Note     Primary Care Physician:  Patient, No Pcp Per  Primary Gastroenterologist: Dr. Cindie    Chief Complaint   Chief Complaint  Patient presents with   Follow-up     History of Present Illness   Matthew Alvarez is a 33 y.o. male presenting today with a history of chronic epigastric pain, s/p EGD in June 2025 in interim from last appointment with gastritis. Presenting today for follow-up.   Historically, he has noted flares with certain dietary triggers. Gallbladder present. Labs in past unrevealing. CBC, CMP, lipase normal in June 2025.   Sauces trigger pain (BBQ, spaghetti, red sauces). No associated nausea. No vomiting. Stress worsens abdominal pain. When drinking more water, he has improvement. Sometimes healthy, bland foods cause pain.  Diarrhea with fried foods, otherwise one BM daily. Omeprazole  without improvement in the past. Carafate  has helped in the past. No overt GI bleeding. No unexplained weight loss or lack of appetite.   Feels better overall than when he was last seen. Feels like omeprazole  not as effective as pantoprazole  that he is on.   EGD June 2025: normal esophagus, gastritis s/p biopsy, normal duodenum. Path: negative H.pylori, +inflammation.   Past Medical History:  Diagnosis Date   GERD (gastroesophageal reflux disease)    Hypertension     Past Surgical History:  Procedure Laterality Date   ESOPHAGOGASTRODUODENOSCOPY N/A 12/11/2023   Procedure: EGD (ESOPHAGOGASTRODUODENOSCOPY);  Surgeon: Cindie Carlin POUR, DO;  Location: [facility];  Service: Endoscopy;  Laterality: N/A;  230pm, asa 2   TONSILLECTOMY AND ADENOIDECTOMY      Current Outpatient Medications  Medication Sig Dispense Refill   pantoprazole  (PROTONIX ) 40 MG tablet Take 1 tablet (40 mg total) by mouth daily. 30 minutes before breakfast 90 tablet 3   No current facility-administered medications for this visit.    Allergies as of 04/21/2024 - Review  Complete 04/21/2024  Allergen Reaction Noted   Clindamycin /lincomycin  05/17/2020   Penicillins Swelling 09/22/2012    Family History  Problem Relation Age of Onset   Colon cancer Neg Hx    Colon polyps Neg Hx     Social History   Socioeconomic History   Marital status: Divorced    Spouse name: Not on file   Number of children: 3   Years of education: Not on file   Highest education level: Not on file  Occupational History   Not on file  Tobacco Use   Smoking status: Never   Smokeless tobacco: Never  Vaping Use   Vaping status: Never Used  Substance and Sexual Activity   Alcohol use: Not Currently   Drug use: No   Sexual activity: Yes    Birth control/protection: None  Other Topics Concern   Not on file  Social History Narrative   Not on file   Social Drivers of Health   Financial Resource Strain: Not on file  Food Insecurity: Not on file  Transportation Needs: Not on file  Physical Activity: Not on file  Stress: Not on file  Social Connections: Not on file  Intimate Partner Violence: Not on file     Review of Systems   Gen: Denies any fever, chills, fatigue, weight loss, lack of appetite.  CV: Denies chest pain, heart palpitations, peripheral edema, syncope.  Resp: Denies shortness of breath at rest or with exertion. Denies wheezing or cough.  GI: Denies dysphagia or odynophagia. Denies jaundice, hematemesis, fecal incontinence. GU : Denies urinary burning, urinary  frequency, urinary hesitancy MS: Denies joint pain, muscle weakness, cramps, or limitation of movement.  Derm: Denies rash, itching, dry skin Psych: Denies depression, anxiety, memory loss, and confusion Heme: Denies bruising, bleeding, and enlarged lymph nodes.   Physical Exam   BP 123/75 (BP Location: Right Arm, Patient Position: Sitting, Cuff Size: Large)   Pulse (!) 56   Temp (!) 97.5 F (36.4 C) (Oral)   Ht 5' 9 (1.753 m)   Wt 237 lb (107.5 kg)   SpO2 96%   BMI 35.00 kg/m   General:   Alert and oriented. Pleasant and cooperative. Well-nourished and well-developed.  Head:  Normocephalic and atraumatic. Eyes:  Without icterus Abdomen:  +BS, soft, non-tender and non-distended. No HSM noted. No guarding or rebound. No masses appreciated.  Rectal:  Deferred  Msk:  Symmetrical without gross deformities. Normal posture. Extremities:  Without edema. Neurologic:  Alert and  oriented x4;  grossly normal neurologically. Skin:  Intact without significant lesions or rashes. Psych:  Alert and cooperative. Normal mood and affect.   Assessment   Matthew Alvarez is a 33 y.o. male presenting today with a history of chronic epigastric pain, s/p EGD in June 2025 in interim from last appointment with gastritis. Presenting today for follow-up.   Epigastric pain: some improvement with pantoprazole  vs omeprazole , and he is able to identify triggers including diet and stress. Interestingly, he does have occasional postprandial pain even if eating bland foods. No typical biliary pain such as RUQ, N/V, etc. Lipase has been normal. However, as gallbladder still present, we will arrange US  abdomen. Consider HIDA. No alarm signs/symptoms    PLAN    Continue pantoprazole  daily Add carafate  prn as this has helped in the past RUQ US  in future, may need HIDA Could consider Buspar in future after ruling out biliary etiology.    Matthew MICAEL Stager, PhD, ANP-BC [facility]

## 2024-04-21 NOTE — Patient Instructions (Signed)
 Continue pantoprazole  daily, 30 minutes before breakfast.   I have sent in carafate  tablets to take up to 4 times a day as needed if abdominal burning.  Avoid the foods that cause the pain.  We are ordering an ultrasound of your gallbladder to make sure this is not an issue!  I will also order labs to check for alpha gal allergy!  I enjoyed seeing you again today! I value our relationship and want to provide genuine, compassionate, and quality care. You may receive a survey regarding your visit with me, and I welcome your feedback! Thanks so much for taking the time to complete this. I look forward to seeing you again.      [name], PhD, ANP-BC [facility]

## 2024-04-28 ENCOUNTER — Ambulatory Visit (HOSPITAL_COMMUNITY): Payer: Self-pay | Attending: Gastroenterology

## 2024-05-11 ENCOUNTER — Encounter: Payer: Self-pay | Admitting: Gastroenterology

## 2024-05-13 ENCOUNTER — Emergency Department (HOSPITAL_COMMUNITY)
Admission: EM | Admit: 2024-05-13 | Discharge: 2024-05-13 | Disposition: A | Discharge status: Home / Self Care | Payer: Self-pay | Attending: Emergency Medicine | Admitting: Emergency Medicine

## 2024-05-13 ENCOUNTER — Other Ambulatory Visit: Payer: Self-pay

## 2024-05-13 ENCOUNTER — Encounter (HOSPITAL_COMMUNITY): Payer: Self-pay

## 2024-05-13 DIAGNOSIS — D72829 Elevated white blood cell count, unspecified: Secondary | ICD-10-CM | POA: Insufficient documentation

## 2024-05-13 DIAGNOSIS — I10 Essential (primary) hypertension: Secondary | ICD-10-CM | POA: Insufficient documentation

## 2024-05-13 DIAGNOSIS — R1011 Right upper quadrant pain: Secondary | ICD-10-CM | POA: Insufficient documentation

## 2024-05-13 LAB — URINALYSIS, ROUTINE W REFLEX MICROSCOPIC
Bacteria, UA: NONE SEEN
Bilirubin Urine: NEGATIVE
Glucose, UA: NEGATIVE mg/dL
Ketones, ur: NEGATIVE mg/dL
Leukocytes,Ua: NEGATIVE
Nitrite: NEGATIVE
Protein, ur: NEGATIVE mg/dL
Specific Gravity, Urine: 1.018 (ref 1.005–1.030)
pH: 6 (ref 5.0–8.0)

## 2024-05-13 LAB — COMPREHENSIVE METABOLIC PANEL WITH GFR
ALT: 29 U/L (ref 0–44)
AST: 23 U/L (ref 15–41)
Albumin: 4.1 g/dL (ref 3.5–5.0)
Alkaline Phosphatase: 105 U/L (ref 38–126)
Anion gap: 8 (ref 5–15)
BUN: 14 mg/dL (ref 6–20)
CO2: 27 mmol/L (ref 22–32)
Calcium: 8.9 mg/dL (ref 8.9–10.3)
Chloride: 103 mmol/L (ref 98–111)
Creatinine, Ser: 1.25 mg/dL — ABNORMAL HIGH (ref 0.61–1.24)
GFR, Estimated: >60 mL/min (ref >60)
Glucose, Bld: 98 mg/dL (ref 70–99)
Potassium: 3.8 mmol/L (ref 3.5–5.1)
Sodium: 138 mmol/L (ref 135–145)
Total Bilirubin: 0.3 mg/dL (ref 0.0–1.2)
Total Protein: 7.3 g/dL (ref 6.5–8.1)

## 2024-05-13 LAB — CBC
HCT: 44.7 % (ref 39.0–52.0)
Hemoglobin: 14.6 g/dL (ref 13.0–17.0)
MCH: 27.7 pg (ref 26.0–34.0)
MCHC: 32.7 g/dL (ref 30.0–36.0)
MCV: 84.8 fL (ref 80.0–100.0)
Platelets: 281 K/uL (ref 150–400)
RBC: 5.27 MIL/uL (ref 4.22–5.81)
RDW: 13.1 % (ref 11.5–15.5)
WBC: 12.4 K/uL — ABNORMAL HIGH (ref 4.0–10.5)
nRBC: 0 % (ref 0.0–0.2)

## 2024-05-13 LAB — LIPASE, BLOOD: Lipase: 35 U/L (ref 11–51)

## 2024-05-13 MED ORDER — PANTOPRAZOLE SODIUM 40 MG IV SOLR
40.0000 mg | Freq: Once | INTRAVENOUS | Status: AC
  Administered 2024-05-13: 40 mg via INTRAVENOUS
  Filled 2024-05-13: qty 10

## 2024-05-13 MED ORDER — MORPHINE SULFATE (PF) 2 MG/ML IV SOLN
2.0000 mg | Freq: Once | INTRAVENOUS | Status: AC
  Administered 2024-05-13: 2 mg via INTRAVENOUS
  Filled 2024-05-13: qty 1

## 2024-05-13 MED ORDER — ONDANSETRON HCL 4 MG/2ML IJ SOLN
4.0000 mg | Freq: Once | INTRAMUSCULAR | Status: AC
  Administered 2024-05-13: 4 mg via INTRAVENOUS
  Filled 2024-05-13: qty 2

## 2024-05-13 MED ORDER — FENTANYL CITRATE (PF) 100 MCG/2ML IJ SOLN: 50.0000 ug | Freq: Once | INTRAMUSCULAR | Status: DC

## 2024-05-13 NOTE — ED Triage Notes (Signed)
 Pt reports RUQ abdominal pain beginning after eating around 8:30pm tonight. Hx of acid reflux. Pt reports still have gallbladder. Pt states he took OTC medication for reflux with no relief. Denies N/V/D, fever. NAD noted in triage.

## 2024-05-13 NOTE — ED Provider Notes (Signed)
 Matthew Alvarez, Matthew Shaw Provider Note   CSN: [id number] Arrival date & time: 05/13/24  0206     Patient presents with: Abdominal Pain   Matthew Alvarez is a 33 y.o. male.   The history is provided by the patient.  Abdominal Pain Pain location:  RUQ Pain radiates to:  Does not radiate Pain severity:  Moderate Onset quality:  Sudden Timing:  Constant Progression:  Unchanged Chronicity:  Recurrent Relieved by:  Nothing Patient with history of GERD, hypertension presents with abdominal pain.  Patient reports he had a large meal with Thanksgiving dinner.  Several hours later he started having upper abdominal pain.  No fevers or vomiting.  No change in his bowel movements.  No chest pain or shortness of breath.  He has had this previously and typically involves heavy meals.  He has been seen by GI previously.  He is scheduled to have an outpatient ultrasound.    Past Medical History:  Diagnosis Date   GERD (gastroesophageal reflux disease)    Hypertension     Prior to Admission medications   Medication Sig Start Date End Date Taking? Authorizing Provider  pantoprazole  (PROTONIX ) 40 MG tablet Take 1 tablet (40 mg total) by mouth daily. 30 minutes before breakfast 12/02/23   Shirlean Therisa ORN, NP  sucralfate  (CARAFATE ) 1 g tablet Take 1 tablet (1 g total) by mouth 4 (four) times daily -  before meals and at bedtime. As needed for abdominal pain. 04/21/24   Shirlean Therisa ORN, NP    Allergies: Clindamycin /lincomycin and Penicillins    Review of Systems  Gastrointestinal:  Positive for abdominal pain.    Updated Vital Signs BP (!) 161/105 (BP Location: Right Arm)   Pulse (!) 54   Temp (!) 97.5 F (36.4 C) (Oral)   Resp 16   Ht 1.753 m (5' 9)   Wt 102.1 kg   SpO2 99%   BMI 33.23 kg/m   Physical Exam CONSTITUTIONAL: Well developed/well nourished HEAD: Normocephalic/atraumatic EYES: EOMI/PERRL, no icterus ENMT: Mucous membranes moist NECK:  supple no meningeal signs CV: S1/S2 noted, no murmurs/rubs/gallops noted LUNGS: Lungs are clear to auscultation bilaterally, no apparent distress ABDOMEN: soft,mild RUQ tenderness, no rebound or guarding, bowel sounds noted throughout abdomen NEURO: Pt is awake/alert/appropriate, moves all extremitiesx4.  No facial droop.   EXTREMITIES:  full ROM SKIN: warm, color normal PSYCH: no abnormalities of mood noted, alert and oriented to situation  (all labs ordered are listed, but only abnormal results are displayed) Labs Reviewed  COMPREHENSIVE METABOLIC PANEL WITH GFR - Abnormal; Notable for the following components:      Result Value   Creatinine, Ser 1.25 (*)    All other components within normal limits  CBC - Abnormal; Notable for the following components:   WBC 12.4 (*)    All other components within normal limits  URINALYSIS, ROUTINE W REFLEX MICROSCOPIC - Abnormal; Notable for the following components:   Color, Urine STRAW (*)    Hgb urine dipstick SMALL (*)    All other components within normal limits  LIPASE, BLOOD    EKG: Alvarez  Radiology: No results found.   Procedures   Medications Ordered in the ED  ondansetron  (ZOFRAN ) injection 4 mg (4 mg Intravenous Given 05/13/24 0409)  morphine  (PF) 2 MG/ML injection 2 mg (2 mg Intravenous Given 05/13/24 0409)  pantoprazole  (PROTONIX ) injection 40 mg (40 mg Intravenous Given 05/13/24 0451)    Clinical Course as of 05/13/24 9473  Fri May 13, 2024  9475 Patient presented with right upper quadrant abdominal pain after eating a heavy meal for Thanksgiving.  He reports he has had these episodes previously.  He has already been established with gastroenterology but has not been able to get an ultrasound.  On my exam he had tenderness of the right upper quadrant but otherwise well-appearing [DW]  9474 Patient improved after medications.  Labs are overall unremarkable except for mild leukocytosis  Given patient's history, exam, I  have low suspicion for acute abdominal emergency.  He will be discharged home.  He will call GI to follow-up for an ultrasound.  We discussed strict return precautions  Patient has underlying hypertension but no signs of hypertensive emergency [DW]    Clinical Course User Index [DW] Midge Golas, MD                                 Medical Decision Making Amount and/or Complexity of Data Reviewed Labs: ordered.  Risk Prescription drug management.   This patient presents to the ED for concern of abdominal pain, this involves an extensive number of treatment options, and is a complaint that carries with it a high risk of complications and morbidity.  The differential diagnosis includes but is not limited to cholecystitis, cholelithiasis, pancreatitis, gastritis, peptic ulcer disease, appendicitis, bowel obstruction, bowel perforation, diverticulitis   Comorbidities that complicate the patient evaluation: Patient's presentation is complicated by their history of hypertension  Social Determinants of Health: Patient's lack of insurance  increases the complexity of managing their presentation  Additional history obtained: Records reviewed GI notes reviewed Patient with similar episodes in the past, outpatient ultrasound pending  Lab Tests: I Ordered, and personally interpreted labs.  The pertinent results include: Mild leukocytosis  Medicines ordered and prescription drug management: I ordered medication including morphine  for pain Reevaluation of the patient after these medicines showed that the patient    improved  Test Considered: Abdominal ultrasound imaging was considered, but since patient is improved and has outpatient follow-up will defer for now No signs of acute abdominal emergency at this time   Reevaluation: After the interventions noted above, I reevaluated the patient and found that they have :improved  Complexity of problems addressed: Patient's presentation  is most consistent with  exacerbation of chronic illness  Disposition: After consideration of the diagnostic results and the patient's response to treatment,  I feel that the patent would benefit from discharge  .        Final diagnoses:  Right upper quadrant abdominal pain    ED Discharge Orders     Alvarez          Midge Golas, MD 05/13/24 [phone]

## 2024-05-13 NOTE — Discharge Instructions (Signed)

## 2024-05-16 ENCOUNTER — Telehealth: Payer: Self-pay | Admitting: Gastroenterology

## 2024-05-16 MED ORDER — SUCRALFATE 1 G PO TABS: 1.0000 g | ORAL_TABLET | Freq: Three times a day (TID) | ORAL | 1 refills | Status: DC

## 2024-05-16 NOTE — Telephone Encounter (Signed)
 Patient requesting more carafate , as this has been helpful in the past. I have refilled this.

## 2024-05-24 ENCOUNTER — Encounter (HOSPITAL_COMMUNITY): Payer: Self-pay | Admitting: Emergency Medicine

## 2024-05-24 ENCOUNTER — Emergency Department (HOSPITAL_COMMUNITY)
Admission: EM | Admit: 2024-05-24 | Discharge: 2024-05-24 | Disposition: A | Discharge status: Home / Self Care | Payer: Self-pay | Attending: Emergency Medicine | Admitting: Emergency Medicine

## 2024-05-24 ENCOUNTER — Other Ambulatory Visit: Payer: Self-pay

## 2024-05-24 DIAGNOSIS — K279 Peptic ulcer, site unspecified, unspecified as acute or chronic, without hemorrhage or perforation: Secondary | ICD-10-CM

## 2024-05-24 LAB — CBC WITH DIFFERENTIAL/PLATELET
Abs Immature Granulocytes: 0.02 K/uL (ref 0.00–0.07)
Basophils Absolute: 0.1 K/uL (ref 0.0–0.1)
Basophils Relative: 1 %
Eosinophils Absolute: 0.2 K/uL (ref 0.0–0.5)
Eosinophils Relative: 2 %
HCT: 45.6 % (ref 39.0–52.0)
Hemoglobin: 14.7 g/dL (ref 13.0–17.0)
Immature Granulocytes: 0 %
Lymphocytes Relative: 48 %
Lymphs Abs: 5.1 K/uL — ABNORMAL HIGH (ref 0.7–4.0)
MCH: 27.7 pg (ref 26.0–34.0)
MCHC: 32.2 g/dL (ref 30.0–36.0)
MCV: 86 fL (ref 80.0–100.0)
Monocytes Absolute: 0.6 K/uL (ref 0.1–1.0)
Monocytes Relative: 6 %
Neutro Abs: 4.5 K/uL (ref 1.7–7.7)
Neutrophils Relative %: 43 %
Platelets: 258 K/uL (ref 150–400)
RBC: 5.3 MIL/uL (ref 4.22–5.81)
RDW: 13.3 % (ref 11.5–15.5)
WBC: 10.6 K/uL — ABNORMAL HIGH (ref 4.0–10.5)
nRBC: 0 % (ref 0.0–0.2)

## 2024-05-24 LAB — COMPREHENSIVE METABOLIC PANEL WITH GFR
ALT: 16 U/L (ref 0–44)
AST: 21 U/L (ref 15–41)
Albumin: 4.1 g/dL (ref 3.5–5.0)
Alkaline Phosphatase: 95 U/L (ref 38–126)
Anion gap: 6 (ref 5–15)
BUN: 14 mg/dL (ref 6–20)
CO2: 29 mmol/L (ref 22–32)
Calcium: 8.1 mg/dL — ABNORMAL LOW (ref 8.9–10.3)
Chloride: 101 mmol/L (ref 98–111)
Creatinine, Ser: 0.98 mg/dL (ref 0.61–1.24)
GFR, Estimated: >60 mL/min
Glucose, Bld: 106 mg/dL — ABNORMAL HIGH (ref 70–99)
Potassium: 3.9 mmol/L (ref 3.5–5.1)
Sodium: 137 mmol/L (ref 135–145)
Total Bilirubin: 0.3 mg/dL (ref 0.0–1.2)
Total Protein: 7 g/dL (ref 6.5–8.1)

## 2024-05-24 LAB — LIPASE, BLOOD: Lipase: 25 U/L (ref 11–51)

## 2024-05-24 MED ORDER — FAMOTIDINE 20 MG PO TABS: 20.0000 mg | ORAL_TABLET | Freq: Two times a day (BID) | ORAL | 0 refills | Status: AC

## 2024-05-24 MED ORDER — HYDROMORPHONE HCL 1 MG/ML IJ SOLN
1.0000 mg | Freq: Once | INTRAMUSCULAR | Status: AC
  Administered 2024-05-24: 1 mg via INTRAVENOUS
  Filled 2024-05-24: qty 1

## 2024-05-24 MED ORDER — FAMOTIDINE IN NACL 20-0.9 MG/50ML-% IV SOLN
20.0000 mg | Freq: Once | INTRAVENOUS | Status: AC
  Administered 2024-05-24: 20 mg via INTRAVENOUS
  Filled 2024-05-24: qty 50

## 2024-05-24 MED ORDER — ALUM & MAG HYDROXIDE-SIMETH 200-200-20 MG/5ML PO SUSP
30.0000 mL | Freq: Once | ORAL | Status: AC
  Administered 2024-05-24: 30 mL via ORAL
  Filled 2024-05-24: qty 30

## 2024-05-24 MED ORDER — PANTOPRAZOLE SODIUM 40 MG IV SOLR
40.0000 mg | Freq: Once | INTRAVENOUS | Status: AC
  Administered 2024-05-24: 40 mg via INTRAVENOUS
  Filled 2024-05-24: qty 10

## 2024-05-24 MED ORDER — SUCRALFATE 1 GM/10ML PO SUSP: 1.0000 g | Freq: Three times a day (TID) | ORAL | 0 refills | Status: AC

## 2024-05-24 NOTE — ED Notes (Signed)
 Discharge instructions reviewed.   Newly prescribed medications discussed. Pharmacy verified.   Opportunity for questions and concerns provided.   Alert, oriented and ambulatory.   Displays no signs of distress.

## 2024-05-24 NOTE — ED Triage Notes (Signed)
 Pt c/o upper right abd pain since around 2000 after drinking lemonade. He states he is having an ulcer flareup. He states he has been taking prilosec with no results.

## 2024-05-24 NOTE — ED Provider Notes (Signed)
 [place] EMERGENCY DEPARTMENT AT [facility] Provider Note   CSN: [id number] Arrival date & time: 05/24/24  0144     Patient presents with: Abdominal Pain   Matthew Alvarez is a 33 y.o. male.   33 year old male with history of peptic ulcer disease/GERD who presents to the ER today secondary to epigastric pain.  Patient states he drink some lemonade's earlier today and about 15 to 30 minutes later he started having burning sharp pain in his epigastric area similar to previous episodes.  He has a ultrasound scheduled for gallbladder and liver presumably on Wednesday morning.  Has a GI doctor in town as well.  States he tried taking pantoprazole  this evening which did not seem to help.  Had some nausea no vomiting.  No distention.  No diarrhea or constipation.  No fevers or trauma.  No shortness of breath, diaphoresis or lightheadedness.   Abdominal Pain      Prior to Admission medications   Medication Sig Start Date End Date Taking? Authorizing Provider  famotidine  (PEPCID ) 20 MG tablet Take 1 tablet (20 mg total) by mouth 2 (two) times daily. 05/24/24  Yes Jun Osment, Selinda, MD  sucralfate  (CARAFATE ) 1 GM/10ML suspension Take 10 mLs (1 g total) by mouth 4 (four) times daily -  with meals and at bedtime. 05/24/24  Yes Chukwuemeka Artola, Selinda, MD  pantoprazole  (PROTONIX ) 40 MG tablet Take 1 tablet (40 mg total) by mouth daily. 30 minutes before breakfast 12/02/23   Shirlean Therisa ORN, NP    Allergies: Clindamycin /lincomycin and Penicillins    Review of Systems  Gastrointestinal:  Positive for abdominal pain.    Updated Vital Signs BP 132/85   Pulse (!) 56   Temp 97.7 F (36.5 C) (Oral)   Resp 16   Ht 5' 9 (1.753 m)   Wt 102 kg   SpO2 97%   BMI 33.21 kg/m   Physical Exam Vitals and nursing note reviewed.  Constitutional:      Appearance: He is well-developed.  HENT:     Head: Normocephalic and atraumatic.  Cardiovascular:     Rate and Rhythm: Normal rate.  Pulmonary:      Effort: Pulmonary effort is normal. No respiratory distress.  Abdominal:     General: There is no distension.     Tenderness: There is abdominal tenderness in the epigastric area. There is no guarding or rebound.  Musculoskeletal:        General: Normal range of motion.     Cervical back: Normal range of motion.  Neurological:     Mental Status: He is alert.     (all labs ordered are listed, but only abnormal results are displayed) Labs Reviewed  CBC WITH DIFFERENTIAL/PLATELET - Abnormal; Notable for the following components:      Result Value   WBC 10.6 (*)    Lymphs Abs 5.1 (*)    All other components within normal limits  COMPREHENSIVE METABOLIC PANEL WITH GFR - Abnormal; Notable for the following components:   Glucose, Bld 106 (*)    Calcium 8.1 (*)    All other components within normal limits  LIPASE, BLOOD    EKG: None  Radiology: No results found.   Procedures   Medications Ordered in the ED  pantoprazole  (PROTONIX ) injection 40 mg (40 mg Intravenous Given 05/24/24 0244)  famotidine  (PEPCID ) IVPB 20 mg premix (0 mg Intravenous Stopped 05/24/24 0325)  alum & mag hydroxide-simeth (MAALOX/MYLANTA) 200-200-20 MG/5ML suspension 30 mL (30 mLs Oral Given 05/24/24  9756)  HYDROmorphone  (DILAUDID ) injection 1 mg (1 mg Intravenous Given 05/24/24 0346)                                    Medical Decision Making Amount and/or Complexity of Data Reviewed Labs: ordered. ECG/medicine tests: ordered.  Risk OTC drugs. Prescription drug management.   Labs are at baseline within normal limits.  Low suspicion for pancreatitis, ACS, cholecystitis, choledocholithiasis or any other emergent etiology requiring further workup or hospitalization.  Symptoms are mostly improved with GI medications.  Will give a dose of pain medicine for resolution.  Pain resolved. Resent prescriptions for antacids. Needs GI follow up which he states he already has. No indication for imaging, further  labs or admission at this time.   Final diagnoses:  PUD (peptic ulcer disease)    ED Discharge Orders          Ordered    sucralfate  (CARAFATE ) 1 GM/10ML suspension  3 times daily with meals & bedtime        05/24/24 0507    famotidine  (PEPCID ) 20 MG tablet  2 times daily        05/24/24 0507               Cornel Werber, Selinda, MD 05/24/24 9345

## 2024-05-25 ENCOUNTER — Ambulatory Visit: Payer: Self-pay | Admitting: Gastroenterology

## 2024-05-25 ENCOUNTER — Encounter: Payer: Self-pay | Admitting: *Deleted

## 2024-05-25 ENCOUNTER — Ambulatory Visit (HOSPITAL_COMMUNITY)
Admission: RE | Admit: 2024-05-25 | Discharge: 2024-05-25 | Payer: Self-pay | Attending: Gastroenterology | Admitting: Gastroenterology

## 2024-05-25 DIAGNOSIS — R1010 Upper abdominal pain, unspecified: Secondary | ICD-10-CM | POA: Insufficient documentation

## 2024-05-25 NOTE — Progress Notes (Signed)
 REferral sent to general surgery - [facility]

## 2024-06-13 ENCOUNTER — Encounter (HOSPITAL_COMMUNITY): Payer: Self-pay

## 2024-06-13 ENCOUNTER — Other Ambulatory Visit: Payer: Self-pay

## 2024-06-13 ENCOUNTER — Emergency Department (HOSPITAL_COMMUNITY)
Admission: EM | Admit: 2024-06-13 | Discharge: 2024-06-14 | Disposition: A | Discharge status: Home / Self Care | Payer: Self-pay | Attending: Emergency Medicine | Admitting: Emergency Medicine

## 2024-06-13 DIAGNOSIS — R1013 Epigastric pain: Secondary | ICD-10-CM | POA: Insufficient documentation

## 2024-06-13 LAB — COMPREHENSIVE METABOLIC PANEL WITH GFR
ALT: 16 U/L (ref 0–44)
AST: 23 U/L (ref 15–41)
Albumin: 4.3 g/dL (ref 3.5–5.0)
Alkaline Phosphatase: 101 U/L (ref 38–126)
Anion gap: 6 (ref 5–15)
BUN: 16 mg/dL (ref 6–20)
CO2: 30 mmol/L (ref 22–32)
Calcium: 8.9 mg/dL (ref 8.9–10.3)
Chloride: 101 mmol/L (ref 98–111)
Creatinine, Ser: 1.05 mg/dL (ref 0.61–1.24)
GFR, Estimated: >60 mL/min
Glucose, Bld: 88 mg/dL (ref 70–99)
Potassium: 3.7 mmol/L (ref 3.5–5.1)
Sodium: 137 mmol/L (ref 135–145)
Total Bilirubin: 0.3 mg/dL (ref 0.0–1.2)
Total Protein: 7.3 g/dL (ref 6.5–8.1)

## 2024-06-13 LAB — CBC
HCT: 44.3 % (ref 39.0–52.0)
Hemoglobin: 14.3 g/dL (ref 13.0–17.0)
MCH: 27.7 pg (ref 26.0–34.0)
MCHC: 32.3 g/dL (ref 30.0–36.0)
MCV: 85.7 fL (ref 80.0–100.0)
Platelets: 277 K/uL (ref 150–400)
RBC: 5.17 MIL/uL (ref 4.22–5.81)
RDW: 13.3 % (ref 11.5–15.5)
WBC: 12.1 K/uL — ABNORMAL HIGH (ref 4.0–10.5)
nRBC: 0 % (ref 0.0–0.2)

## 2024-06-13 LAB — LIPASE, BLOOD: Lipase: 35 U/L (ref 11–51)

## 2024-06-13 NOTE — ED Triage Notes (Signed)
 Pt reports RUQ pain with a hx of gall stones, has an appointment 1/8 to plan cholecystectomy.  Pt reports onset of pain tonight at work.

## 2024-06-14 ENCOUNTER — Emergency Department (HOSPITAL_COMMUNITY): Payer: Self-pay

## 2024-06-14 LAB — URINALYSIS, ROUTINE W REFLEX MICROSCOPIC
Bacteria, UA: NONE SEEN
Bilirubin Urine: NEGATIVE
Glucose, UA: NEGATIVE mg/dL
Ketones, ur: NEGATIVE mg/dL
Leukocytes,Ua: NEGATIVE
Nitrite: NEGATIVE
Protein, ur: NEGATIVE mg/dL
Specific Gravity, Urine: 1.02 (ref 1.005–1.030)
pH: 6 (ref 5.0–8.0)

## 2024-06-14 MED ORDER — FAMOTIDINE IN NACL 20-0.9 MG/50ML-% IV SOLN
20.0000 mg | Freq: Once | INTRAVENOUS | Status: AC
  Administered 2024-06-14: 20 mg via INTRAVENOUS
  Filled 2024-06-14: qty 50

## 2024-06-14 MED ORDER — IOHEXOL 300 MG/ML  SOLN
100.0000 mL | Freq: Once | INTRAMUSCULAR | Status: AC | PRN
  Administered 2024-06-14: 100 mL via INTRAVENOUS

## 2024-06-14 MED ORDER — HYDROMORPHONE HCL 1 MG/ML IJ SOLN
1.0000 mg | Freq: Once | INTRAMUSCULAR | Status: AC
  Administered 2024-06-14: 1 mg via INTRAVENOUS
  Filled 2024-06-14: qty 1

## 2024-06-14 MED ORDER — LACTATED RINGERS IV BOLUS
1000.0000 mL | Freq: Once | INTRAVENOUS | Status: AC
  Administered 2024-06-14: 1000 mL via INTRAVENOUS

## 2024-06-14 MED ORDER — PANTOPRAZOLE SODIUM 40 MG IV SOLR
40.0000 mg | Freq: Once | INTRAVENOUS | Status: AC
  Administered 2024-06-14: 40 mg via INTRAVENOUS
  Filled 2024-06-14: qty 10

## 2024-06-14 NOTE — ED Provider Notes (Signed)
 " [place] EMERGENCY DEPARTMENT AT [facility] Provider Note   CSN: [id number] Arrival date & time: 06/13/24  2237     Patient presents with: Abdominal Pain   Matthew Alvarez is a 33 y.o. male.   33 year old male presents ER today with recurrent abdominal pain.  Patient was seen by myself a few weeks ago diagnosed with likely peptic ulcer disease.  Follow-up with gastroenterology and got an ultrasound that showed cholelithiasis.  He has appointment with surgery on the eighth.  States he was at work tonight and had acute onset of burning epigastric pain radiating to the left.  No right upper quadrant pain.  No fever.  Does have elevated nausea.  No diarrhea or constipation.   Abdominal Pain      Prior to Admission medications  Medication Sig Start Date End Date Taking? Authorizing Provider  famotidine  (PEPCID ) 20 MG tablet Take 1 tablet (20 mg total) by mouth 2 (two) times daily. 05/24/24   Issa Kosmicki, Selinda, MD  pantoprazole  (PROTONIX ) 40 MG tablet Take 1 tablet (40 mg total) by mouth daily. 30 minutes before breakfast 12/02/23   Shirlean Therisa ORN, NP  sucralfate  (CARAFATE ) 1 GM/10ML suspension Take 10 mLs (1 g total) by mouth 4 (four) times daily -  with meals and at bedtime. 05/24/24   Syliva Mee, Selinda, MD    Allergies: Clindamycin /lincomycin and Penicillins    Review of Systems  Gastrointestinal:  Positive for abdominal pain.    Updated Vital Signs BP 124/78 (BP Location: Left Arm)   Pulse (!) 48   Temp 97.6 F (36.4 C) (Oral)   Resp 18   Wt 102.1 kg   SpO2 96%   BMI 33.23 kg/m   Physical Exam Vitals and nursing note reviewed.  Constitutional:      Appearance: He is well-developed.  HENT:     Head: Normocephalic and atraumatic.  Cardiovascular:     Rate and Rhythm: Normal rate.  Pulmonary:     Effort: Pulmonary effort is normal. No respiratory distress.  Abdominal:     General: There is no distension.     Tenderness: There is abdominal tenderness in the  epigastric area.  Musculoskeletal:        General: Normal range of motion.     Cervical back: Normal range of motion.  Skin:    General: Skin is warm and dry.  Neurological:     Mental Status: He is alert.     (all labs ordered are listed, but only abnormal results are displayed) Labs Reviewed  CBC - Abnormal; Notable for the following components:      Result Value   WBC 12.1 (*)    All other components within normal limits  URINALYSIS, ROUTINE W REFLEX MICROSCOPIC - Abnormal; Notable for the following components:   Color, Urine STRAW (*)    Hgb urine dipstick SMALL (*)    All other components within normal limits  LIPASE, BLOOD  COMPREHENSIVE METABOLIC PANEL WITH GFR    EKG: None  Radiology: CT ABDOMEN PELVIS W CONTRAST Result Date: 06/14/2024 EXAM: CT ABDOMEN AND PELVIS WITH CONTRAST 06/14/2024 02:14:49 AM TECHNIQUE: CT of the abdomen and pelvis was performed with the administration of 100 mL iohexol  (OMNIPAQUE ) 300 MG/ML solution. Multiplanar reformatted images are provided for review. Automated exposure control, iterative reconstruction, and/or weight-based adjustment of the mA/kV was utilized to reduce the radiation dose to as low as reasonably achievable. COMPARISON: 01/05/2023 CLINICAL HISTORY: Cholelithiasis, abdominal pain. FINDINGS: LOWER CHEST: No acute abnormality.  LIVER: The liver is unremarkable. GALLBLADDER AND BILE DUCTS: Cholelithiasis without superimposed pericholecystic inflammatory change. No intra or extrahepatic biliary ductal dilation. SPLEEN: No acute abnormality. PANCREAS: No acute abnormality. ADRENAL GLANDS: No acute abnormality. KIDNEYS, URETERS AND BLADDER: No stones in the kidneys or ureters. No hydronephrosis. No perinephric or periureteral stranding. Urinary bladder is unremarkable. GI AND BOWEL: 4 mild sigmoid diverticulosis. Appendix normal. The stomach, small bowel, and large bowel are otherwise unremarkable. There is no bowel obstruction. PERITONEUM  AND RETROPERITONEUM: No ascites. No free air. VASCULATURE: Aorta is normal in caliber. LYMPH NODES: No lymphadenopathy. REPRODUCTIVE ORGANS: No acute abnormality. BONES AND SOFT TISSUES: Small fat-containing umbilical hernia. No acute osseous abnormality. No focal soft tissue abnormality. IMPRESSION: 1. No acute findings. 2. Cholelithiasis without superimposed pericholecystic inflammatory change. No intra or extrahepatic biliary ductal dilation. 3. Mild sigmoid diverticulosis without evidence of diverticulitis. 4. Small fat-containing umbilical hernia. Electronically signed by: Dorethia Molt MD 06/14/2024 03:39 AM EST RP Workstation: [id number]     Procedures   Medications Ordered in the ED  HYDROmorphone  (DILAUDID ) injection 1 mg (1 mg Intravenous Given 06/14/24 0058)  pantoprazole  (PROTONIX ) injection 40 mg (40 mg Intravenous Given 06/14/24 0058)  famotidine  (PEPCID ) IVPB 20 mg premix (0 mg Intravenous Stopped 06/14/24 0128)  lactated ringers  bolus 1,000 mL (1,000 mLs Intravenous New Bag/Given 06/14/24 0059)  iohexol  (OMNIPAQUE ) 300 MG/ML solution 100 mL (100 mLs Intravenous Contrast Given 06/14/24 0203)  HYDROmorphone  (DILAUDID ) injection 1 mg (1 mg Intravenous Given 06/14/24 0416)                                    Medical Decision Making Amount and/or Complexity of Data Reviewed Labs: ordered. Radiology: ordered.  Risk Prescription drug management.   Feel like patient's symptoms are still most likely consistent with gastritis/peptic ulcer disease.  He is on medications at home.  Pain resolved here.  CT scan done because we do not have ultrasound available tonight.  This was reassuring without any evidence of cholecystitis, perforation or any other acute otology.  Will continue current regimen and follow-up with GI for further management.  Surgery follow-up in a week.   Final diagnoses:  Epigastric pain    ED Discharge Orders     None          Carlis Burnsworth, Selinda,  MD 06/14/24 0425  "

## 2024-06-16 ENCOUNTER — Observation Stay (HOSPITAL_COMMUNITY)
Admission: EM | Admit: 2024-06-16 | Discharge: 2024-06-18 | Disposition: A | Discharge status: Home / Self Care | Payer: Self-pay | Source: Home / Self Care | Attending: Family Medicine | Admitting: Family Medicine

## 2024-06-16 ENCOUNTER — Encounter (HOSPITAL_COMMUNITY): Payer: Self-pay

## 2024-06-16 ENCOUNTER — Other Ambulatory Visit: Payer: Self-pay

## 2024-06-16 DIAGNOSIS — I10 Essential (primary) hypertension: Secondary | ICD-10-CM | POA: Insufficient documentation

## 2024-06-16 DIAGNOSIS — R030 Elevated blood-pressure reading, without diagnosis of hypertension: Secondary | ICD-10-CM | POA: Diagnosis present

## 2024-06-16 DIAGNOSIS — K8020 Calculus of gallbladder without cholecystitis without obstruction: Principal | ICD-10-CM | POA: Diagnosis present

## 2024-06-16 DIAGNOSIS — K8050 Calculus of bile duct without cholangitis or cholecystitis without obstruction: Secondary | ICD-10-CM

## 2024-06-16 DIAGNOSIS — K219 Gastro-esophageal reflux disease without esophagitis: Secondary | ICD-10-CM | POA: Diagnosis present

## 2024-06-16 DIAGNOSIS — K810 Acute cholecystitis: Secondary | ICD-10-CM

## 2024-06-16 DIAGNOSIS — K8011 Calculus of gallbladder with chronic cholecystitis with obstruction: Principal | ICD-10-CM | POA: Insufficient documentation

## 2024-06-16 LAB — CBC WITH DIFFERENTIAL/PLATELET
Abs Immature Granulocytes: 0.02 K/uL (ref 0.00–0.07)
Basophils Absolute: 0.1 K/uL (ref 0.0–0.1)
Basophils Relative: 0 %
Eosinophils Absolute: 0.2 K/uL (ref 0.0–0.5)
Eosinophils Relative: 1 %
HCT: 46.5 % (ref 39.0–52.0)
Hemoglobin: 15.2 g/dL (ref 13.0–17.0)
Immature Granulocytes: 0 %
Lymphocytes Relative: 54 %
Lymphs Abs: 6.3 K/uL — ABNORMAL HIGH (ref 0.7–4.0)
MCH: 27.6 pg (ref 26.0–34.0)
MCHC: 32.7 g/dL (ref 30.0–36.0)
MCV: 84.4 fL (ref 80.0–100.0)
Monocytes Absolute: 0.6 K/uL (ref 0.1–1.0)
Monocytes Relative: 5 %
Neutro Abs: 4.7 K/uL (ref 1.7–7.7)
Neutrophils Relative %: 40 %
Platelets: 292 K/uL (ref 150–400)
RBC: 5.51 MIL/uL (ref 4.22–5.81)
RDW: 13.2 % (ref 11.5–15.5)
Smear Review: NORMAL
WBC: 11.9 K/uL — ABNORMAL HIGH (ref 4.0–10.5)
nRBC: 0 % (ref 0.0–0.2)

## 2024-06-16 LAB — COMPREHENSIVE METABOLIC PANEL WITH GFR
ALT: 30 U/L (ref 0–44)
AST: 29 U/L (ref 15–41)
Albumin: 4.4 g/dL (ref 3.5–5.0)
Alkaline Phosphatase: 104 U/L (ref 38–126)
Anion gap: 13 (ref 5–15)
BUN: 10 mg/dL (ref 6–20)
CO2: 25 mmol/L (ref 22–32)
Calcium: 9.1 mg/dL (ref 8.9–10.3)
Chloride: 101 mmol/L (ref 98–111)
Creatinine, Ser: 1.07 mg/dL (ref 0.61–1.24)
GFR, Estimated: >60 mL/min
Glucose, Bld: 95 mg/dL (ref 70–99)
Potassium: 3.5 mmol/L (ref 3.5–5.1)
Sodium: 139 mmol/L (ref 135–145)
Total Bilirubin: 0.3 mg/dL (ref 0.0–1.2)
Total Protein: 7.7 g/dL (ref 6.5–8.1)

## 2024-06-16 LAB — LIPASE, BLOOD: Lipase: 32 U/L (ref 11–51)

## 2024-06-16 LAB — HIV ANTIBODY (ROUTINE TESTING W REFLEX): HIV Screen 4th Generation wRfx: NONREACTIVE

## 2024-06-16 MED ORDER — HYDRALAZINE HCL 20 MG/ML IJ SOLN: 10.0000 mg | Freq: Four times a day (QID) | INTRAMUSCULAR | Status: DC | PRN

## 2024-06-16 MED ORDER — CHLORHEXIDINE GLUCONATE CLOTH 2 % EX PADS: 6.0000 | MEDICATED_PAD | Freq: Once | CUTANEOUS | Status: DC

## 2024-06-16 MED ORDER — CHLORHEXIDINE GLUCONATE CLOTH 2 % EX PADS
6.0000 | MEDICATED_PAD | Freq: Once | CUTANEOUS | Status: AC
  Administered 2024-06-16: 6 via TOPICAL

## 2024-06-16 MED ORDER — KETOROLAC TROMETHAMINE 15 MG/ML IJ SOLN
15.0000 mg | Freq: Once | INTRAMUSCULAR | Status: AC
  Administered 2024-06-16: 15 mg via INTRAVENOUS
  Filled 2024-06-16: qty 1

## 2024-06-16 MED ORDER — ONDANSETRON HCL 4 MG/2ML IJ SOLN
4.0000 mg | Freq: Once | INTRAMUSCULAR | Status: AC
  Administered 2024-06-16: 4 mg via INTRAVENOUS
  Filled 2024-06-16: qty 2

## 2024-06-16 MED ORDER — ONDANSETRON HCL 4 MG PO TABS: 4.0000 mg | ORAL_TABLET | Freq: Four times a day (QID) | ORAL | Status: DC | PRN

## 2024-06-16 MED ORDER — ACETAMINOPHEN 650 MG RE SUPP: 650.0000 mg | Freq: Four times a day (QID) | RECTAL | Status: DC | PRN

## 2024-06-16 MED ORDER — FENTANYL CITRATE (PF) 100 MCG/2ML IJ SOLN
100.0000 ug | Freq: Once | INTRAMUSCULAR | Status: AC
  Administered 2024-06-16: 100 ug via INTRAVENOUS
  Filled 2024-06-16: qty 2

## 2024-06-16 MED ORDER — PANTOPRAZOLE SODIUM 40 MG IV SOLR
40.0000 mg | INTRAVENOUS | Status: DC
  Administered 2024-06-16 – 2024-06-18 (×2): 40 mg via INTRAVENOUS
  Filled 2024-06-16 (×2): qty 10

## 2024-06-16 MED ORDER — SODIUM CHLORIDE 0.9% FLUSH
3.0000 mL | Freq: Two times a day (BID) | INTRAVENOUS | Status: DC
  Administered 2024-06-16 – 2024-06-18 (×5): 3 mL via INTRAVENOUS

## 2024-06-16 MED ORDER — SODIUM CHLORIDE 0.9 % IV SOLN
2.0000 g | Freq: Once | INTRAVENOUS | Status: AC
  Administered 2024-06-16: 2 g via INTRAVENOUS
  Filled 2024-06-16: qty 20

## 2024-06-16 MED ORDER — ENOXAPARIN SODIUM 60 MG/0.6ML IJ SOSY
50.0000 mg | PREFILLED_SYRINGE | INTRAMUSCULAR | Status: DC
  Filled 2024-06-16 (×2): qty 0.6

## 2024-06-16 MED ORDER — LACTATED RINGERS IV SOLN: INTRAVENOUS | Status: AC

## 2024-06-16 MED ORDER — ONDANSETRON HCL 4 MG/2ML IJ SOLN: 4.0000 mg | Freq: Four times a day (QID) | INTRAMUSCULAR | Status: DC | PRN

## 2024-06-16 MED ORDER — ACETAMINOPHEN 325 MG PO TABS: 650.0000 mg | ORAL_TABLET | Freq: Four times a day (QID) | ORAL | Status: DC | PRN

## 2024-06-16 MED ORDER — HYDROMORPHONE HCL 1 MG/ML IJ SOLN
0.5000 mg | Freq: Once | INTRAMUSCULAR | Status: AC
  Administered 2024-06-16: 0.5 mg via INTRAVENOUS
  Filled 2024-06-16: qty 0.5

## 2024-06-16 MED ORDER — HYDROMORPHONE HCL 1 MG/ML IJ SOLN
0.5000 mg | INTRAMUSCULAR | Status: DC | PRN
  Administered 2024-06-16: 1 mg via INTRAVENOUS
  Filled 2024-06-16: qty 1

## 2024-06-16 MED ORDER — SODIUM CHLORIDE 0.9 % IV BOLUS
1000.0000 mL | Freq: Once | INTRAVENOUS | Status: AC
  Administered 2024-06-16: 1000 mL via INTRAVENOUS

## 2024-06-16 NOTE — ED Provider Notes (Signed)
" °  Physical Exam  BP 136/77   Pulse 73   Temp 98.4 F (36.9 C) (Oral)   Resp 17   Ht 5' 9 (1.753 m)   Wt 102.1 kg   SpO2 98%   BMI 33.23 kg/m   Physical Exam  Procedures  Procedures  ED Course / MDM   Clinical Course as of 06/16/24 0817  Thu Jun 16, 2024  0600 Patient with history of multiple ER visits for recurrent abdominal pain, recently diagnosed with cholelithiasis.  Presents tonight with abrupt onset of right upper quadrant abdominal pain with nonbloody emesis Patient appears uncomfortable and has been actively vomiting Patient has tenderness in the right upper quadrant.  Will consult surgery [DW]  0621 WBC(!): 11.9 Mild leukocytosis [DW]  0621 Discussed the case with on-call surgery Dr. Evonnie Recommends initial plan of pain and nausea control.  If pain is intractable after multiple rounds of medicines, he may be amenable for admission. If pain can be managed he can be discharged and follow-up as an outpatient as he has an appointment next week.  He can return at any time as she is on-call through the weekend [DW]  [phone] Patient still with right upper quadrant tenderness, but is feeling improved.  No chest pain or shortness of breath is reported  Plan to treat pain and reassess.  If pain has not improved, will need to re-consult general surgery [DW]  [phone] Signed out to [name] at shift change [DW]  [phone] Assumed care from Dr Midge. 34 yo M hx of cholelithiasis who presents with RUQ pain and vomiting. Was discussed with surgery who recommends treating him symptomatically. If he doesn't improve will need admission.  [RP]  [phone] Patient still an 8 out of 10 pain.  General surgery updated.  They anticipate being able to take the patient to the OR tomorrow. [RP]  [phone] Patient given ceftriaxone in case of developing cholecystitis.  Discussed with Dr. Bryn for admission [RP]    Clinical Course User Index [DW] Midge Golas, MD [RP] Yolande Lamar BROCKS, MD    Medical Decision Making Amount and/or Complexity of Data Reviewed Labs: ordered. Decision-making details documented in ED Course.  Risk Prescription drug management. Decision regarding hospitalization.      Yolande Lamar BROCKS, MD 06/16/24 [phone]  "

## 2024-06-16 NOTE — H&P (Signed)
 " History and Physical    Patient: Matthew Alvarez FMW:[id number] DOB: 1990/06/20 DOA: 06/16/2024 DOS: the patient was seen and examined on 06/16/2024 PCP: Patient, No Pcp Per  Patient coming from: Home  Chief Complaint:  Chief Complaint  Patient presents with   Abdominal Pain   HPI: Matthew Alvarez is a 34 year old male with a history of GERD and recently diagnosed cholelithiasis, who presents with recurrent, worsening abdominal pain.  He was initially evaluated in the ED on 06/13/24 for burning epigastric pain radiating to the left, associated with nausea but without vomiting, fever, or bowel changes. At that time, CT abdomen/pelvis showed cholelithiasis without cholecystitis and no other acute intraabdominal pathology. His symptoms improved with IV analgesia and acid suppression, and he was discharged with a plan for outpatient surgical follow-up scheduled for 06/23/2024.  He now re-presents early morning 06/16/2024 with acute onset severe right upper quadrant pain, which woke him from sleep around 4 AM. The pain is constant, non-radiating, and associated with persistent nausea and non-bloody emesis. He had tried just drinking water and eating fruit, denies EtOH.    In the ED, he was noted to have moderate RUQ tenderness and ongoing pain despite multiple rounds of IV opioids and antiemetics. Laboratory workup revealed a mild leukocytosis (WBC 12.1k) with otherwise normal CMP and lipase. Given intractable biliary-type pain and failure of outpatient management, general surgery was re-consulted, and the decision was made to admit the patient with anticipation of operative management (possible cholecystectomy) 06/17/2024.    Review of Systems: As mentioned in the history of present illness. All other systems reviewed and are negative.He denies fever, chills, diarrhea, chest pain, or shortness of breath. Past Medical History:  Diagnosis Date   GERD (gastroesophageal reflux disease)    Hypertension     Past Surgical History:  Procedure Laterality Date   ESOPHAGOGASTRODUODENOSCOPY N/A 12/11/2023   Procedure: EGD (ESOPHAGOGASTRODUODENOSCOPY);  Surgeon: Cindie Carlin POUR, DO;  [facility]: [facility];  Service: Endoscopy;  Laterality: N/A;  230pm, asa 2   TONSILLECTOMY AND ADENOIDECTOMY     Social History:  reports that he has never smoked. He has never used smokeless tobacco. He reports that he does not currently use alcohol. He reports that he does not use drugs.  Allergies[1]  Family History  Problem Relation Age of Onset   Colon cancer Neg Hx    Colon polyps Neg Hx     Prior to Admission medications  Medication Sig Start Date End Date Taking? Authorizing Provider  famotidine  (PEPCID ) 20 MG tablet Take 1 tablet (20 mg total) by mouth 2 (two) times daily. 05/24/24   Mesner, Selinda, MD  pantoprazole  (PROTONIX ) 40 MG tablet Take 1 tablet (40 mg total) by mouth daily. 30 minutes before breakfast 12/02/23   Shirlean Therisa ORN, NP  sucralfate  (CARAFATE ) 1 GM/10ML suspension Take 10 mLs (1 g total) by mouth 4 (four) times daily -  with meals and at bedtime. 05/24/24   Mesner, Selinda, MD    Physical Exam: Vitals:   06/16/24 0700 06/16/24 0735 06/16/24 0736 06/16/24 0830  BP: (!) 133/91  136/77 135/81  Pulse: (!) 57 73  60  Resp:      Temp:      TempSrc:      SpO2: 95% 98%  100%  Weight:      Height:      Gen: No distress Pulm: Clear, nonlabored  CV: RRR, no MRG or edema GI: Soft, tender mostly in RUQ without rebound or guarding, +  BS Neuro: Alert and oriented. No new focal deficits. Ext: Warm, no deformities Skin: No jaundice or rashes, lesions or ulcers on visualized skin   Data Reviewed: WBC 12.1k > 11.9k (lymphs 6.3k).  CBC otherwise wnl CMP and lipase wnl  Assessment and Plan: 34 year old male with recurrent, intractable right upper quadrant abdominal pain consistent with biliary colic in the setting of known cholelithiasis, now failing outpatient and ED-based symptomatic  management, with concern for evolving cholecystitis.  Symptomatic cholelithiasis/biliary colic:  - Surgery consulted, planning OR 06/17/2024.  - Will keep NPO, give IVF, continue IV analgesics and antiemetics prn - Was given ceftriaxone, would defer to surgery whether this should be continued.  GERD:  - PPI IV for now  Elevated blood pressure: Likely related to pain, not on medications.  - Monitor    Advance Care Planning: Full code  Consults: General surgery  Family Communication: None at bedside  Severity of Illness: The appropriate patient status for this patient is OBSERVATION. Observation status is judged to be reasonable and necessary in order to provide the required intensity of service to ensure the patient's safety. The patient's presenting symptoms, physical exam findings, and initial radiographic and laboratory data in the context of their medical condition is felt to place them at decreased risk for further clinical deterioration. Furthermore, it is anticipated that the patient will be medically stable for discharge from the hospital within 2 midnights of admission.   Author: Bernardino KATHEE Come, MD 06/16/2024 10:10 AM  For on call review [email or web address].     [1]  Allergies Allergen Reactions   Clindamycin /Lincomycin     Pt reports took this antibiotic in June for dental pain and caused itching to chest   Penicillins Swelling    Has patient had a PCN reaction causing immediate rash, facial/tongue/throat swelling, SOB or lightheadedness with hypotension: Yes Has patient had a PCN reaction causing severe rash involving mucus membranes or skin necrosis: No Has patient had a PCN reaction that required hospitalization No Has patient had a PCN reaction occurring within the last 10 years: No If all of the above answers are NO, then may proceed with Cephalosporin use.   "

## 2024-06-16 NOTE — Consult Note (Signed)
 [facility] Consult  Reason for Consult: Symptomatic cholelithiasis Referring Physician: Dr. Midge  Chief Complaint   Abdominal Pain     HPI: Matthew Alvarez is a 34 y.o. male who presented to the hospital with a 1 hour history of severe right upper quadrant abdominal pain with associated nausea and vomiting.  Patient was recently diagnosed with gallstones on a right upper quadrant ultrasound in early December.  He was seen in the emergency department earlier this week for right upper quadrant abdominal pain.  During that visit, he was thought to have symptomatic cholelithiasis, and could follow-up outpatient with general surgery in 1 week.  Imaging and blood work at that visit were reassuring.  Since discharge from the hospital, he still had some persistent right upper quadrant abdominal pain with nausea.  This morning starting at 4 AM, he had severe sharp right upper quadrant abdominal pain which prompted him to return to the emergency department.  His past medical history significant for GERD and hypertension.  He denies any history of abdominal surgeries.  He denies recent use of alcohol, tobacco products, and illicit drugs.  In the emergency department, he was noted to be hemodynamically stable.  He had a mild leukocytosis of 11.9 which was slightly improved from leukocytosis of 12.1 earlier in the week.  Imaging was not repeated, as he underwent a CT of the abdomen and pelvis on 12/29 which demonstrated cholelithiasis without evidence of associated inflammatory changes.  Given his benign workup, plan was to try to control the patient's pain and have him follow-up outpatient.  However, his pain persisted even after multiple doses of IV pain medication, which prompted admission for intractable pain likely from symptomatic cholelithiasis.  Past Medical History:  Diagnosis Date   GERD (gastroesophageal reflux disease)    Hypertension     Past Surgical History:  Procedure  Laterality Date   ESOPHAGOGASTRODUODENOSCOPY N/A 12/11/2023   Procedure: EGD (ESOPHAGOGASTRODUODENOSCOPY);  Surgeon: Cindie Carlin POUR, DO;  Location: [facility];  Service: Endoscopy;  Laterality: N/A;  230pm, asa 2   TONSILLECTOMY AND ADENOIDECTOMY      Family History  Problem Relation Age of Onset   Colon cancer Neg Hx    Colon polyps Neg Hx     Social History[1]  Medications: I have reviewed the patient's current medications.  Allergies[2]   ROS:  Pertinent items are noted in HPI.  Blood pressure (!) 148/82, pulse (!) 58, temperature 97.8 F (36.6 C), temperature source Oral, resp. rate 18, height 5' 9 (1.753 m), weight 102.1 kg, SpO2 100%. Physical Exam Vitals reviewed.  Constitutional:      Appearance: He is well-developed.  HENT:     Head: Normocephalic and atraumatic.  Eyes:     Extraocular Movements: Extraocular movements intact.     Pupils: Pupils are equal, round, and reactive to light.  Cardiovascular:     Rate and Rhythm: Normal rate.  Pulmonary:     Effort: Pulmonary effort is normal.  Abdominal:     Comments: Abdomen soft, nondistended, no percussion tenderness, mild tenderness to palpation in the right upper quadrant; no rigidity, guarding, rebound tenderness; negative Murphy sign  Skin:    General: Skin is warm and dry.  Neurological:     General: No focal deficit present.     Mental Status: He is alert and oriented to person, place, and time.  Psychiatric:        Mood and Affect: Mood normal.  Behavior: Behavior normal.     Results: Results for orders placed or performed during the hospital encounter of 06/16/24 (from the past 48 hours)  CBC with Differential     Status: Abnormal   Collection Time: 06/16/24  5:15 AM  Result Value Ref Range   WBC 11.9 (H) 4.0 - 10.5 K/uL   RBC 5.51 4.22 - 5.81 MIL/uL   Hemoglobin 15.2 13.0 - 17.0 g/dL   HCT 53.4 60.9 - 47.9 %   MCV 84.4 80.0 - 100.0 fL   MCH 27.6 26.0 - 34.0 pg   MCHC 32.7 30.0 -  36.0 g/dL   RDW 86.7 88.4 - 84.4 %   Platelets 292 150 - 400 K/uL   nRBC 0.0 0.0 - 0.2 %   Neutrophils Relative % 40 %   Neutro Abs 4.7 1.7 - 7.7 K/uL   Lymphocytes Relative 54 %   Lymphs Abs 6.3 (H) 0.7 - 4.0 K/uL   Monocytes Relative 5 %   Monocytes Absolute 0.6 0.1 - 1.0 K/uL   Eosinophils Relative 1 %   Eosinophils Absolute 0.2 0.0 - 0.5 K/uL   Basophils Relative 0 %   Basophils Absolute 0.1 0.0 - 0.1 K/uL   RBC Morphology MORPHOLOGY UNREMARKABLE    Smear Review Normal platelet morphology    Immature Granulocytes 0 %   Abs Immature Granulocytes 0.02 0.00 - 0.07 K/uL   Reactive, Benign Lymphocytes PRESENT     Comment: Performed at [facility], [address], [place], [place] [address]  Comprehensive metabolic panel     Status: None   Collection Time: 06/16/24  5:15 AM  Result Value Ref Range   Sodium 139 135 - 145 mmol/L   Potassium 3.5 3.5 - 5.1 mmol/L   Chloride 101 98 - 111 mmol/L   CO2 25 22 - 32 mmol/L   Glucose, Bld 95 70 - 99 mg/dL    Comment: Glucose reference range applies only to samples taken after fasting for at least 8 hours.   BUN 10 6 - 20 mg/dL   Creatinine, Ser 8.92 0.61 - 1.24 mg/dL   Calcium 9.1 8.9 - 89.6 mg/dL   Total Protein 7.7 6.5 - 8.1 g/dL   Albumin 4.4 3.5 - 5.0 g/dL   AST 29 15 - 41 U/L    Comment: HEMOLYSIS AT THIS LEVEL MAY AFFECT RESULT   ALT 30 0 - 44 U/L   Alkaline Phosphatase 104 38 - 126 U/L   Total Bilirubin 0.3 0.0 - 1.2 mg/dL   GFR, Estimated >39 >39 mL/min    Comment: (NOTE) Calculated using the CKD-EPI Creatinine Equation (2021)    Anion gap 13 5 - 15    Comment: Performed at [facility], [address], [place], [place] [address]  Lipase, blood     Status: None   Collection Time: 06/16/24  5:15 AM  Result Value Ref Range   Lipase 32 11 - 51 U/L    Comment: Performed at [facility], [address] [place], [place] [address]  Blood culture (routine x 2)     Status: None (Preliminary result)   Collection Time: 06/16/24   8:55 AM   Specimen: BLOOD  Result Value Ref Range   Specimen Description BLOOD LEFT ANTECUBITAL    Special Requests      BOTTLES DRAWN AEROBIC AND ANAEROBIC Blood Culture adequate volume Performed at [facility], [address] [place], [place] [address]    Culture PENDING    Report Status PENDING  Blood culture (routine x 2)     Status: None (Preliminary result)   Collection Time: 06/16/24  8:55 AM   Specimen: BLOOD  Result Value Ref Range   Specimen Description BLOOD BLOOD RIGHT HAND    Special Requests      BOTTLES DRAWN AEROBIC AND ANAEROBIC Blood Culture adequate volume Performed at [facility], [address] [place], [place] [address]    Culture PENDING    Report Status PENDING     No results found.   Assessment & Plan:  Matthew Alvarez is a 34 y.o. male who was admitted with symptomatic cholelithiasis and intractable abdominal pain.  Imaging and blood work evaluated by myself.  -I discussed the pathophysiology of gallbladder disease and the recommendations for cholecystectomy.  Given that he has presented to the emergency department twice this week with our ability to control his pain, decision was made for the patient to be admitted for inpatient cholecystectomy -I counseled the patient about the indication, risks and benefits of robotic assisted laparoscopic cholecystectomy.  He understands there is a very small chance for bleeding, infection, injury to normal structures (including common bile duct), conversion to open surgery, persistent symptoms, evolution of postcholecystectomy diarrhea, need for secondary interventions, anesthesia reaction, cardiopulmonary issues and other risks not specifically detailed here. I described the expected recovery, the plan for follow-up and the restrictions during the recovery phase.  All questions were answered. -Tentatively plan for cholecystectomy tomorrow morning -Low-fat diet today, NPO at midnight -Dose of IV Rocephin given in the  emergency department.  Will plan for prophylactic antibiotics prior to surgery -IV fluids -PRN pain control and antiemetics -Anticipate patient will be stable for discharge home after surgery pending tolerance of diet and pain control with oral medication  All questions were answered to the satisfaction of the patient and family.  Note: Portions of this report may have been transcribed using voice recognition software. Every effort has been made to ensure accuracy; however, inadvertent computerized transcription errors may still be present.   -- Dorothyann Brittle, DO [facility] [address] [place], [place] [address] [phone] (office)       [1]  Social History Tobacco Use   Smoking status: Never   Smokeless tobacco: Never  Vaping Use   Vaping status: Never Used  Substance Use Topics   Alcohol use: Not Currently   Drug use: No  [2]  Allergies Allergen Reactions   Clindamycin /Lincomycin     Pt reports took this antibiotic in June for dental pain and caused itching to chest   Penicillins Swelling    Has patient had a PCN reaction causing immediate rash, facial/tongue/throat swelling, SOB or lightheadedness with hypotension: Yes Has patient had a PCN reaction causing severe rash involving mucus membranes or skin necrosis: No Has patient had a PCN reaction that required hospitalization No Has patient had a PCN reaction occurring within the last 10 years: No If all of the above answers are NO, then may proceed with Cephalosporin use.

## 2024-06-16 NOTE — ED Triage Notes (Signed)
 Pov from home. Cc of gallbladder pain. 10/10

## 2024-06-16 NOTE — ED Provider Notes (Signed)
 " [facility] AT [facility] Provider Note   CSN: [id number] Arrival date & time: 06/16/24  0456     Patient presents with: Abdominal Pain   Matthew Alvarez is a 34 y.o. male.   The history is provided by the patient.  Patient with history of GERD, hypertension, recent diagnosis of cholelithiasis presents for right upper quadrant abdominal pain Patient reports that around 4 AM he woke up with severe right upper quadrant abdominal pain that does not radiate.  He reports has had associated nausea vomiting with nonbloody emesis.  No diarrhea.  No chest pain. He reports he has made significant changes in his diet, he denies any alcohol use He is scheduled to see surgery in 1 week    No other acute complaints.  Denies any arm or leg weakness.  No new back pain is reported. He does not take any daily medicines for hypertension Past Medical History:  Diagnosis Date   GERD (gastroesophageal reflux disease)    Hypertension     Prior to Admission medications  Medication Sig Start Date End Date Taking? Authorizing Provider  famotidine  (PEPCID ) 20 MG tablet Take 1 tablet (20 mg total) by mouth 2 (two) times daily. 05/24/24   Mesner, Selinda, MD  pantoprazole  (PROTONIX ) 40 MG tablet Take 1 tablet (40 mg total) by mouth daily. 30 minutes before breakfast 12/02/23   Shirlean Therisa ORN, NP  sucralfate  (CARAFATE ) 1 GM/10ML suspension Take 10 mLs (1 g total) by mouth 4 (four) times daily -  with meals and at bedtime. 05/24/24   Mesner, Selinda, MD    Allergies: Clindamycin /lincomycin and Penicillins    Review of Systems  Constitutional:  Negative for fever.  Cardiovascular:  Negative for chest pain.  Gastrointestinal:  Positive for abdominal pain, nausea and vomiting. Negative for diarrhea.    Updated Vital Signs BP 132/79   Pulse (!) 54   Temp 98.4 F (36.9 C) (Oral)   Resp 17   Ht 1.753 m (5' 9)   Wt 102.1 kg   SpO2 95%   BMI 33.23 kg/m   Physical  Exam CONSTITUTIONAL: Well developed/well nourished, uncomfortable appearing HEAD: Normocephalic/atraumatic ENMT: Mucous membranes moist NECK: supple no meningeal signs CV: S1/S2 noted, no murmurs/rubs/gallops noted LUNGS: Lungs are clear to auscultation bilaterally, no apparent distress ABDOMEN: soft, moderate RUQ tenderness, no rebound or guarding, bowel sounds noted throughout abdomen NEURO: Pt is awake/alert/appropriate, moves all extremitiesx4.  No facial droop.   EXTREMITIES: pulses normal/equal, full ROM SKIN: warm, color normal PSYCH: Mildly anxious  (all labs ordered are listed, but only abnormal results are displayed) Labs Reviewed  CBC WITH DIFFERENTIAL/PLATELET - Abnormal; Notable for the following components:      Result Value   WBC 11.9 (*)    Lymphs Abs 6.3 (*)    All other components within normal limits  COMPREHENSIVE METABOLIC PANEL WITH GFR  LIPASE, BLOOD    EKG: None  Radiology: No results found.   Procedures   Medications Ordered in the ED  fentaNYL  (SUBLIMAZE ) injection 100 mcg (100 mcg Intravenous Given 06/16/24 0555)  ondansetron  (ZOFRAN ) injection 4 mg (4 mg Intravenous Given 06/16/24 0553)  HYDROmorphone  (DILAUDID ) injection 0.5 mg (0.5 mg Intravenous Given 06/16/24 0639)  ondansetron  (ZOFRAN ) injection 4 mg (4 mg Intravenous Given 06/16/24 0639)  sodium chloride  0.9 % bolus 1,000 mL (1,000 mLs Intravenous New Bag/Given 06/16/24 0639)    Clinical Course as of 06/16/24 0653  Thu Jun 16, 2024  0600 Patient with  history of multiple ER visits for recurrent abdominal pain, recently diagnosed with cholelithiasis.  Presents tonight with abrupt onset of right upper quadrant abdominal pain with nonbloody emesis Patient appears uncomfortable and has been actively vomiting Patient has tenderness in the right upper quadrant.  Will consult surgery [DW]  0621 WBC(!): 11.9 Mild leukocytosis [DW]  0621 Discussed the case with on-call surgery Dr. Evonnie Recommends  initial plan of pain and nausea control.  If pain is intractable after multiple rounds of medicines, he may be amenable for admission. If pain can be managed he can be discharged and follow-up as an outpatient as he has an appointment next week.  He can return at any time as she is on-call through the weekend [DW]  [phone] Patient still with right upper quadrant tenderness, but is feeling improved.  No chest pain or shortness of breath is reported  Plan to treat pain and reassess.  If pain has not improved, will need to re-consult general surgery [DW]  [phone] Signed out to [name] at shift change [DW]    Clinical Course User Index [DW] Midge Golas, MD                                 Medical Decision Making Amount and/or Complexity of Data Reviewed Labs: ordered. Decision-making details documented in ED Course.  Risk Prescription drug management.   This patient presents to the ED for concern of right upper quadrant abdominal pain, this involves an extensive number of treatment options, and is a complaint that carries with it a high risk of complications and morbidity.  The differential diagnosis includes but is not limited to cholecystitis, cholelithiasis, pancreatitis, gastritis, peptic ulcer disease, bowel obstruction, bowel perforation   Comorbidities that complicate the patient evaluation: Patients presentation is complicated by their history of hypertension  Social Determinants of Health: Patients multiple ER visits  increases the complexity of managing their presentation  Additional history obtained: Records reviewed previous ultrasound results noted, GI  consultation noted  Lab Tests: I Ordered, and personally interpreted labs.  The pertinent results include: Leukocytosis  Medicines ordered and prescription drug management: I ordered medication including fentanyl  for analgesia Reevaluation of the patient after these medicines showed that the patient     improved  Consultations Obtained: I requested consultation with the consultant general surgery, and discussed  findings as well as pertinent plan - they recommend: Treat pain and reassess, if no improvement can be re-consulted  Reevaluation: After the interventions noted above, I reevaluated the patient and found that they have :improved  Complexity of problems addressed: Patients presentation is most consistent with  acute presentation with potential threat to life or bodily function       Final diagnoses:  Calculus of gallbladder without cholecystitis without obstruction  Biliary colic    ED Discharge Orders     None          Midge Golas, MD 06/16/24 [phone]  "

## 2024-06-16 NOTE — Plan of Care (Signed)

## 2024-06-16 NOTE — ED Notes (Signed)
 Pt has not had anything to eat or drink since 4am.

## 2024-06-17 ENCOUNTER — Encounter (HOSPITAL_COMMUNITY): Payer: Self-pay | Admitting: Family Medicine

## 2024-06-17 ENCOUNTER — Other Ambulatory Visit: Payer: Self-pay

## 2024-06-17 ENCOUNTER — Observation Stay (HOSPITAL_COMMUNITY): Payer: Self-pay | Admitting: Certified Registered Nurse Anesthetist

## 2024-06-17 ENCOUNTER — Encounter (HOSPITAL_COMMUNITY): Admission: EM | Disposition: A | Payer: Self-pay | Source: Home / Self Care | Attending: Family Medicine

## 2024-06-17 LAB — PROTIME-INR
INR: 1 (ref 0.8–1.2)
Prothrombin Time: 13.8 s (ref 11.4–15.2)

## 2024-06-17 LAB — COMPREHENSIVE METABOLIC PANEL WITH GFR
ALT: 27 U/L (ref 0–44)
AST: 21 U/L (ref 15–41)
Albumin: 4 g/dL (ref 3.5–5.0)
Alkaline Phosphatase: 91 U/L (ref 38–126)
Anion gap: 5 (ref 5–15)
BUN: 7 mg/dL (ref 6–20)
CO2: 31 mmol/L (ref 22–32)
Calcium: 8.8 mg/dL — ABNORMAL LOW (ref 8.9–10.3)
Chloride: 102 mmol/L (ref 98–111)
Creatinine, Ser: 1.06 mg/dL (ref 0.61–1.24)
GFR, Estimated: >60 mL/min
Glucose, Bld: 96 mg/dL (ref 70–99)
Potassium: 3.9 mmol/L (ref 3.5–5.1)
Sodium: 138 mmol/L (ref 135–145)
Total Bilirubin: 0.3 mg/dL (ref 0.0–1.2)
Total Protein: 6.6 g/dL (ref 6.5–8.1)

## 2024-06-17 LAB — CBC
HCT: 43.9 % (ref 39.0–52.0)
Hemoglobin: 14.2 g/dL (ref 13.0–17.0)
MCH: 27.5 pg (ref 26.0–34.0)
MCHC: 32.3 g/dL (ref 30.0–36.0)
MCV: 85.1 fL (ref 80.0–100.0)
Platelets: 276 K/uL (ref 150–400)
RBC: 5.16 MIL/uL (ref 4.22–5.81)
RDW: 13.2 % (ref 11.5–15.5)
WBC: 10.5 K/uL (ref 4.0–10.5)
nRBC: 0 % (ref 0.0–0.2)

## 2024-06-17 LAB — SURGICAL PCR SCREEN
MRSA, PCR: NEGATIVE
Staphylococcus aureus: NEGATIVE

## 2024-06-17 MED ORDER — SODIUM CHLORIDE 0.9 % IV SOLN
2.0000 g | INTRAVENOUS | Status: AC
  Administered 2024-06-17: 2 g via INTRAVENOUS

## 2024-06-17 MED ORDER — OXYCODONE HCL 5 MG PO TABS: 5.0000 mg | ORAL_TABLET | Freq: Four times a day (QID) | ORAL | 0 refills | Status: AC | PRN

## 2024-06-17 MED ORDER — ACETAMINOPHEN 500 MG PO TABS: 1000.0000 mg | ORAL_TABLET | Freq: Four times a day (QID) | ORAL | 0 refills | Status: AC

## 2024-06-17 MED ORDER — OXYCODONE HCL 5 MG PO TABS
5.0000 mg | ORAL_TABLET | Freq: Four times a day (QID) | ORAL | Status: DC | PRN
  Administered 2024-06-17: 5 mg via ORAL
  Filled 2024-06-17: qty 1

## 2024-06-17 MED ORDER — DOCUSATE SODIUM 100 MG PO CAPS: 100.0000 mg | ORAL_CAPSULE | Freq: Two times a day (BID) | ORAL | 2 refills | Status: AC

## 2024-06-17 MED ORDER — DEXMEDETOMIDINE HCL IN NACL 80 MCG/20ML IV SOLN
INTRAVENOUS | Status: DC | PRN
  Administered 2024-06-17 (×2): 8 ug via INTRAVENOUS

## 2024-06-17 MED ORDER — LIDOCAINE 2% (20 MG/ML) 5 ML SYRINGE
INTRAMUSCULAR | Status: AC
  Filled 2024-06-17: qty 5

## 2024-06-17 MED ORDER — OXYCODONE HCL 5 MG PO TABS
5.0000 mg | ORAL_TABLET | ORAL | Status: DC | PRN
  Administered 2024-06-17 – 2024-06-18 (×4): 5 mg via ORAL
  Filled 2024-06-17 (×4): qty 1

## 2024-06-17 MED ORDER — ROCURONIUM BROMIDE 10 MG/ML (PF) SYRINGE
PREFILLED_SYRINGE | INTRAVENOUS | Status: AC
  Filled 2024-06-17: qty 10

## 2024-06-17 MED ORDER — PROPOFOL 10 MG/ML IV BOLUS
INTRAVENOUS | Status: AC
  Filled 2024-06-17: qty 20

## 2024-06-17 MED ORDER — OXYCODONE HCL 5 MG PO TABS: 5.0000 mg | ORAL_TABLET | Freq: Once | ORAL | Status: DC | PRN

## 2024-06-17 MED ORDER — FENTANYL CITRATE (PF) 100 MCG/2ML IJ SOLN
INTRAMUSCULAR | Status: DC | PRN
  Administered 2024-06-17 (×3): 50 ug via INTRAVENOUS
  Administered 2024-06-17: 100 ug via INTRAVENOUS

## 2024-06-17 MED ORDER — HYDROMORPHONE HCL 1 MG/ML IJ SOLN
INTRAMUSCULAR | Status: AC
  Filled 2024-06-17: qty 0.5

## 2024-06-17 MED ORDER — ONDANSETRON HCL 4 MG/2ML IJ SOLN
INTRAMUSCULAR | Status: AC
  Filled 2024-06-17: qty 2

## 2024-06-17 MED ORDER — STERILE WATER FOR IRRIGATION IR SOLN
Status: DC | PRN
  Administered 2024-06-17: 500 mL

## 2024-06-17 MED ORDER — SUGAMMADEX SODIUM 200 MG/2ML IV SOLN
INTRAVENOUS | Status: AC
  Filled 2024-06-17: qty 2

## 2024-06-17 MED ORDER — ACETAMINOPHEN 500 MG PO TABS
1000.0000 mg | ORAL_TABLET | Freq: Four times a day (QID) | ORAL | Status: DC
  Administered 2024-06-17 – 2024-06-18 (×4): 1000 mg via ORAL
  Filled 2024-06-17 (×4): qty 2

## 2024-06-17 MED ORDER — MIDAZOLAM HCL (PF) 2 MG/2ML IJ SOLN
INTRAMUSCULAR | Status: DC | PRN
  Administered 2024-06-17: 2 mg via INTRAVENOUS

## 2024-06-17 MED ORDER — INDOCYANINE GREEN 25 MG IJ SOLR
2.5000 mg | Freq: Once | INTRAMUSCULAR | Status: AC
  Administered 2024-06-17: 2.5 mg via INTRAVENOUS

## 2024-06-17 MED ORDER — BUPIVACAINE HCL (PF) 0.5 % IJ SOLN
INTRAMUSCULAR | Status: AC
  Filled 2024-06-17: qty 30

## 2024-06-17 MED ORDER — DEXMEDETOMIDINE HCL IN NACL 80 MCG/20ML IV SOLN
INTRAVENOUS | Status: AC
  Filled 2024-06-17: qty 20

## 2024-06-17 MED ORDER — KETOROLAC TROMETHAMINE 30 MG/ML IJ SOLN
INTRAMUSCULAR | Status: DC | PRN
  Administered 2024-06-17: 30 mg via INTRAVENOUS

## 2024-06-17 MED ORDER — FENTANYL CITRATE (PF) 250 MCG/5ML IJ SOLN
INTRAMUSCULAR | Status: AC
  Filled 2024-06-17: qty 5

## 2024-06-17 MED ORDER — HYDROMORPHONE HCL 1 MG/ML IJ SOLN
INTRAMUSCULAR | Status: DC | PRN
  Administered 2024-06-17: .5 mg via INTRAVENOUS

## 2024-06-17 MED ORDER — PROPOFOL 10 MG/ML IV BOLUS
INTRAVENOUS | Status: DC | PRN
  Administered 2024-06-17: 200 mg via INTRAVENOUS

## 2024-06-17 MED ORDER — SUGAMMADEX SODIUM 200 MG/2ML IV SOLN
INTRAVENOUS | Status: DC | PRN
  Administered 2024-06-17: 250 mg via INTRAVENOUS

## 2024-06-17 MED ORDER — ONDANSETRON HCL 4 MG PO TABS: 4.0000 mg | ORAL_TABLET | Freq: Every day | ORAL | 1 refills | Status: AC | PRN

## 2024-06-17 MED ORDER — BUPIVACAINE HCL (PF) 0.5 % IJ SOLN
INTRAMUSCULAR | Status: DC | PRN
  Administered 2024-06-17: 30 mL

## 2024-06-17 MED ORDER — LIDOCAINE 2% (20 MG/ML) 5 ML SYRINGE
INTRAMUSCULAR | Status: DC | PRN
  Administered 2024-06-17: 100 mg via INTRAVENOUS

## 2024-06-17 MED ORDER — HYDROMORPHONE HCL 1 MG/ML IJ SOLN
0.2500 mg | INTRAMUSCULAR | Status: DC | PRN
  Administered 2024-06-17 (×2): 0.5 mg via INTRAVENOUS
  Filled 2024-06-17: qty 0.5

## 2024-06-17 MED ORDER — ONDANSETRON HCL 4 MG/2ML IJ SOLN
INTRAMUSCULAR | Status: DC | PRN
  Administered 2024-06-17: 4 mg via INTRAVENOUS

## 2024-06-17 MED ORDER — ROCURONIUM BROMIDE 10 MG/ML (PF) SYRINGE
PREFILLED_SYRINGE | INTRAVENOUS | Status: DC | PRN
  Administered 2024-06-17: 70 mg via INTRAVENOUS

## 2024-06-17 MED ORDER — OXYCODONE HCL 5 MG/5ML PO SOLN: 5.0000 mg | Freq: Once | ORAL | Status: DC | PRN

## 2024-06-17 MED ORDER — LACTATED RINGERS IV SOLN: INTRAVENOUS | Status: DC | PRN

## 2024-06-17 MED ORDER — MIDAZOLAM HCL 2 MG/2ML IJ SOLN
INTRAMUSCULAR | Status: AC
  Filled 2024-06-17: qty 2

## 2024-06-17 NOTE — Plan of Care (Signed)
  Problem: Education: Goal: Knowledge of General Education information will improve Description: Including pain rating scale, medication(s)/side effects and non-pharmacologic comfort measures Outcome: Progressing   Problem: Health Behavior/Discharge Planning: Goal: Ability to manage health-related needs will improve Outcome: Progressing   Problem: Clinical Measurements: Goal: Ability to maintain clinical measurements within normal limits will improve Outcome: Progressing Goal: Will remain free from infection Outcome: Progressing Goal: Diagnostic test results will improve Outcome: Progressing Goal: Respiratory complications will improve Outcome: Progressing Goal: Cardiovascular complication will be avoided Outcome: Progressing   Problem: Nutrition: Goal: Adequate nutrition will be maintained Outcome: Progressing   Problem: Coping: Goal: Level of anxiety will decrease Outcome: Progressing   Problem: Pain Managment: Goal: General experience of comfort will improve and/or be controlled Outcome: Progressing   Problem: Safety: Goal: Ability to remain free from injury will improve Outcome: Progressing   Problem: Skin Integrity: Goal: Risk for impaired skin integrity will decrease Outcome: Progressing

## 2024-06-17 NOTE — Anesthesia Preprocedure Evaluation (Addendum)
"                                    Anesthesia Evaluation  Patient identified by MRN, date of birth, ID band Patient awake    Reviewed: Allergy & Precautions, H&P , NPO status , Patient's Chart, lab work & pertinent test results  Airway Mallampati: I  TM Distance: >3 FB Neck ROM: Full    Dental no notable dental hx.    Pulmonary neg pulmonary ROS   Pulmonary exam normal breath sounds clear to auscultation       Cardiovascular hypertension, Normal cardiovascular exam Rhythm:Regular Rate:Bradycardia     Neuro/Psych negative neurological ROS  negative psych ROS   GI/Hepatic Neg liver ROS,GERD  ,,  Endo/Other  negative endocrine ROS    Renal/GU negative Renal ROS  negative genitourinary   Musculoskeletal negative musculoskeletal ROS (+)    Abdominal   Peds negative pediatric ROS (+)  Hematology negative hematology ROS (+)   Anesthesia Other Findings   Reproductive/Obstetrics negative OB ROS                              Anesthesia Physical Anesthesia Plan  ASA: 2  Anesthesia Plan: General   Post-op Pain Management:    Induction: Intravenous  PONV Risk Score and Plan:   Airway Management Planned: Oral ETT  Additional Equipment:   Intra-op Plan:   Post-operative Plan: Extubation in OR  Informed Consent: I have reviewed the patients History and Physical, chart, labs and discussed the procedure including the risks, benefits and alternatives for the proposed anesthesia with the patient or authorized representative who has indicated his/her understanding and acceptance.     Dental advisory given  Plan Discussed with: CRNA  Anesthesia Plan Comments:          Anesthesia Quick Evaluation  "

## 2024-06-17 NOTE — Plan of Care (Signed)

## 2024-06-17 NOTE — Discharge Instructions (Addendum)
Ambulatory Surgery Discharge Instructions  General Anesthesia or Sedation Do not drive or operate heavy machinery for 24 hours.  Do not consume alcohol, tranquilizers, sleeping medications, or any non-prescribed medications for 24 hours. Do not make important decisions or sign any important papers in the next 24 hours. You should have someone with you tonight at home.  Activity  You are advised to go directly home from the hospital.  Restrict your activities and rest for a day.  Resume light activity tomorrow. No heavy lifting over 10 lbs or strenuous exercise.  Fluids and Diet Regular diet  Medications  If you have not had a bowel movement in 24 hours, take 2 tablespoons over the counter Milk of mag.             You May resume your blood thinners tomorrow (Aspirin, coumadin, or other).  You are being discharged with prescriptions for Opioid/Narcotic Medications: There are some specific considerations for these medications that you should know. Opioid Meds have risks & benefits. Addiction to these meds is always a concern with prolonged use Take medication only as directed Do not drive while taking narcotic pain medication Do not crush tablets or capsules Do not use a different container than medication was dispensed in Lock the container of medication in a cool, dry place out of reach of children and pets. Opioid medication can cause addiction Do not share with anyone else (this is a felony) Do not store medications for future use. Dispose of them properly.     Disposal:  Find a [facility] household drug take back site near you.  If you can't get to a drug take back site, use the recipe below as a last resort to dispose of expired, unused or unwanted drugs. Disposal  (Do not dispose chemotherapy drugs this way, talk to your prescribing doctor instead.) Step 1: Mix drugs (do not crush) with dirt, kitty litter, or used coffee grounds and add a small amount of water to dissolve any  solid medications. Step 2: Seal drugs in plastic bag. Step 3: Place plastic bag in trash. Step 4: Take prescription container and scratch out personal information, then recycle or throw away.  Operative Site  You have a liquid bandage over your incisions, this will begin to flake off in about a week. Ok to shower tomorrow. Keep wound clean and dry. No baths or swimming. No lifting more than 10 pounds.  Contact Information: If you have questions or concerns, please call our office, [phone], Monday- Thursday 8AM-5PM and Friday 8AM-12Noon.  If it is after hours or on the weekend, please call [facility] Main Number, [phone], and ask to speak to the surgeon on call for Dr. Brittnee Gaetano at [place].   SPECIFIC COMPLICATIONS TO WATCH FOR: Inability to urinate Fever over 101? F by mouth Nausea and vomiting lasting longer than 24 hours. Pain not relieved by medication ordered Swelling around the operative site Increased redness, warmth, hardness, around operative area Numbness, tingling, or cold fingers or toes Blood -soaked dressing, (small amounts of oozing may be normal) Increasing and progressive drainage from surgical area or exam site  

## 2024-06-17 NOTE — Anesthesia Procedure Notes (Signed)
 Procedure Name: Intubation Date/Time: 06/17/2024 11:19 AM  Performed by: Elaine Delon CROME, CRNAPre-anesthesia Checklist: Patient identified, Emergency Drugs available, Suction available and Patient being monitored Patient Re-evaluated:Patient Re-evaluated prior to induction Oxygen  Delivery Method: Circle system utilized Preoxygenation: Pre-oxygenation with 100% oxygen  Induction Type: IV induction Ventilation: Mask ventilation without difficulty Laryngoscope Size: Mac and 4 Grade View: Grade I Tube type: Oral Number of attempts: 1 Airway Equipment and Method: Stylet Placement Confirmation: ETT inserted through vocal cords under direct vision, positive ETCO2 and breath sounds checked- equal and bilateral Secured at: 22 cm Tube secured with: Tape Dental Injury: Teeth and Oropharynx as per pre-operative assessment

## 2024-06-17 NOTE — Progress Notes (Signed)
 [facility] Progress Note  * Day of Surgery *  Subjective: Patient seen and examined.  He is resting comfortably in bed.  He is ready for surgery.  His abdominal pain has improved, but he is feeling hungry this morning.  He confirms passing flatus but denies any bowel movements since being in the hospital.  Objective: Vital signs in last 24 hours: Temp:  [97.6 F (36.4 C)-98.7 F (37.1 C)] 97.6 F (36.4 C) (01/02 0848) Pulse Rate:  [53-66] 66 (01/02 0848) Resp:  [17-20] 20 (01/02 0848) BP: (122-150)/(57-93) 150/93 (01/02 0848) SpO2:  [97 %-100 %] 97 % (01/02 0848) Last BM Date : 06/16/24  Intake/Output from previous day: 01/01 0701 - 01/02 0700 In: 1428.5 [I.V.:1428.5] Out: -  Intake/Output this shift: Total I/O In: 344.2 [I.V.:344.2] Out: -   General appearance: alert, cooperative, and no distress GI: abdomen soft, nondistended, no percussion tenderness, minimal TTP in RUQ; no rigidity, guarding, rebound tenderness; negative Murphy sign  Lab Results:  Recent Labs    06/16/24 0515 06/17/24 0448  WBC 11.9* 10.5  HGB 15.2 14.2  HCT 46.5 43.9  PLT 292 276   BMET Recent Labs    06/16/24 0515 06/17/24 0448  NA 139 138  K 3.5 3.9  CL 101 102  CO2 25 31  GLUCOSE 95 96  BUN 10 7  CREATININE 1.07 1.06  CALCIUM 9.1 8.8*   PT/INR Recent Labs    06/17/24 0448  LABPROT 13.8  INR 1.0    Studies/Results: No results found.  Anti-infectives: Anti-infectives (From admission, onward)    Start     Dose/Rate Route Frequency Ordered Stop   06/17/24 1000  cefoTEtan (CEFOTAN) 2 g in sodium chloride  0.9 % 100 mL IVPB        2 g 200 mL/hr over 30 Minutes Intravenous On call to O.R. 06/17/24 0904 06/18/24 0559   06/16/24 0800  cefTRIAXone (ROCEPHIN) 2 g in sodium chloride  0.9 % 100 mL IVPB        2 g 200 mL/hr over 30 Minutes Intravenous  Once 06/16/24 0752 06/16/24 0925       Assessment/Plan:  Patient is a 34 year old male who is admitted with  symptomatic cholelithiasis and intractable abdominal pain.  -Plan for robotic assisted laparoscopic cholecystectomy today -NPO, IVF -No leukocytosis and LFTs within normal limits -PRN pain control and antiemetics -Prophylactic Cefotan ordered -Further recommendations to follow surgery.  Patient likely will be stable for discharge home later today assuming he can tolerate a diet and pain is controlled with oral medication   LOS: 0 days    Gerard Cantara A Bridget Westbrooks 06/17/2024  Note: Portions of this report may have been transcribed using voice recognition software. Every effort has been made to ensure accuracy; however, inadvertent computerized transcription errors may still be present.

## 2024-06-17 NOTE — Transfer of Care (Signed)
 Immediate Anesthesia Transfer of Care Note  Patient: Matthew Alvarez  Procedure(s) Performed: CHOLECYSTECTOMY, ROBOT-ASSISTED, LAPAROSCOPIC (Abdomen)  Patient Location: PACU  Anesthesia Type:General  Level of Consciousness: drowsy  Airway & Oxygen  Therapy: Patient Spontanous Breathing and Patient connected to nasal cannula oxygen   Post-op Assessment: Report given to RN and Post -op Vital signs reviewed and stable  Post vital signs: Reviewed and stable  Last Vitals:  Vitals Value Taken Time  BP 115/68   Temp 97.9   Pulse 67 06/17/24 12:49  Resp 14 06/17/24 12:49  SpO2 100 % 06/17/24 12:49  Vitals shown include unfiled device data.  Last Pain:  Vitals:   06/17/24 0848  TempSrc: Axillary  PainSc:          Complications: No notable events documented.

## 2024-06-17 NOTE — Anesthesia Postprocedure Evaluation (Signed)
"   Anesthesia Post Note  Patient: Matthew Alvarez  Procedure(s) Performed: CHOLECYSTECTOMY, ROBOT-ASSISTED, LAPAROSCOPIC (Abdomen)  Patient location during evaluation: PACU Anesthesia Type: General Level of consciousness: awake and alert Pain management: pain level controlled Vital Signs Assessment: post-procedure vital signs reviewed and stable Respiratory status: spontaneous breathing, nonlabored ventilation, respiratory function stable and patient connected to nasal cannula oxygen  Cardiovascular status: blood pressure returned to baseline and stable Postop Assessment: no apparent nausea or vomiting Anesthetic complications: no   No notable events documented.   Last Vitals:  Vitals:   06/17/24 0848 06/17/24 1249  BP: (!) 150/93   Pulse: 66 66  Resp: 20 18  Temp: 36.4 C 36.6 C  SpO2: 97% 100%    Last Pain:  Vitals:   06/17/24 1306  TempSrc:   PainSc: 7                  Andrea Limes      "

## 2024-06-17 NOTE — Op Note (Signed)
 [facility] Operative Note  06/17/2024  Preoperative Diagnosis: Symptomatic Cholelithiasis, intractable abdominal pain   Postoperative Diagnosis: Same   Procedure(s) Performed: Robotic Assisted Laparoscopic Cholecystectomy   Surgeon: Dorothyann Brittle, DO   Assistants: No qualified resident was available    Anesthesia: General endotracheal   Anesthesiologist: Herschell Hollering, MD    Specimens: Gallbladder   Estimated Blood Loss: Minimal   Blood Replacement: None    Complications: None   Wound Class: Clean contaminated   Operative Indications: The patient was found to have cholelithiasis on imaging and was symptomatic.  He was evaluated in the ED, and his pain was unable to be controlled, so decision was made to admit the patient and proceed with inpatient cholecystectomy.  We discussed the risk of the procedure including but not limited to bleeding, infection, injury to the common bile duct, bile leak, need for further procedures, chance of subtotal cholecystectomy.   Findings:  Chronically inflamed, nondistended gallbladder Critical view of safety noted All clips intact at the end of the case Adequate hemostasis   Procedure: Firefly was given in the preoperative area.  The patient was taken to the operating room and placed supine. General endotracheal anesthesia was induced. Intravenous antibiotics were administered per protocol.  An orogastric tube positioned to decompress the stomach.  The abdomen was prepared and draped in the usual sterile fashion. A time-out was completed verifying correct patient, procedure, site, positioning, and implant(s) and/or special equipment prior to beginning this procedure.  Veress needle was placed at the infraumbilical area and insufflation was started after confirming a positive saline drop test and no immediate increase in abdominal pressure.  After reaching 15 mm, the Veress needle was removed and a 8 mm port was placed  via optiview technique infraumbilical, measuring 20 mm away from the suspected position of the gallbladder.  The abdomen was inspected and no abnormalities or injuries were found.  Under direct vision, ports were placed in the following locations in a semi curvilinear position around the target of the gallbladder: Two 8 mm ports on the patient's right each having 8cm clearance to the adjacent ports and one 8 mm port placed on the patient's left 8 cm from the umbilical port. Once ports were placed, the table was placed in the reverse Trendelenburg position with the right side up. The Xi platform was brought into the operative field and docked to the ports successfully.  An endoscope was placed through the umbilical port, prograsp through the most lateral right port, fenestrated bipolar to the port just right of the umbilicus, and then a hook cautery in the left port.  The dome of the gallbladder was grasped with prograsp and retracted over the dome of the liver. Adhesions between the gallbladder and omentum, duodenum and transverse colon were lysed via hook cautery. The infundibulum was grasped with the fenestrated grasper and retracted toward the right lower quadrant. This maneuver exposed Calots triangle. Firefly was used throughout the dissection to ensure safe visualization of the cystic duct.The peritoneum overlying the gallbladder infundibulum was then dissected and the cystic duct and cystic artery identified.  Critical view of safety with the liver bed clearly visible behind the duct and artery with no additional structures noted.  The cystic duct and cystic artery were doubly clipped and divided close to the gallbladder.    The gallbladder was then dissected from its peritoneal and liver bed attachments by electrocautery. Hemostasis was checked prior to removing the hook cautery.  The Anton was undocked and  moved out of the field.  A 5mm Endo Catch bag was then placed through the umbilical port and the  gallbladder was removed.  The gallbladder was passed off the table as a specimen. There was no evidence of bleeding from the gallbladder fossa or cystic artery or leakage of the bile from the cystic duct stump. The umbilical port site closed with a 0 vicryl with a PMI needle.  The abdomen was desufflated and secondary trocars were removed under direct vision. No bleeding was noted. Incisions were localized with marcaine.  All skin incisions were closed with subcuticular sutures of 4-0 monocryl and dermabond.   Final inspection revealed acceptable hemostasis. All counts were correct at the end of the case. The patient was awakened from anesthesia and extubated without complication. The OG tube was removed.  The patient went to the PACU in stable condition.   Dorothyann Brittle, DO [facility] [address] [place] [address] [place], [place] [address] [phone] (office)

## 2024-06-17 NOTE — Progress Notes (Signed)
 [facility]  Spoke with the patient's mother in the patient's room on the floor.  I explained that he tolerated the procedure without difficulty.  He has dissolvable stitches under the skin with overlying skin glue.  This will flake off in 10 to 14 days. He will return to his room on the floor.  If he tolerates a diet without nausea and vomiting and pain is controlled with oral medications, then plan for discharge home later today.  He will be discharged home with a prescription for narcotic pain medication that they should take as needed for pain.  I also want him taking scheduled Tylenol .  If they take the narcotic pain medication, they should take a stool softener as well.  The patient will follow-up with me in 2 weeks for phone follow-up.  All questions were answered to her expressed satisfaction.  Plan: -Return to room on the floor -Regular diet -PRN pain control and antiemetics -If patient tolerates diet without nausea and vomiting and pain controlled with oral medications, he is stable for discharge home this afternoon  Dorothyann Brittle, DO [facility] [address] [place], [place] [address] [phone] (office)

## 2024-06-18 MED ORDER — POLYETHYLENE GLYCOL 3350 17 G PO PACK: 17.0000 g | PACK | Freq: Every day | ORAL | 0 refills | Status: AC

## 2024-06-18 MED ORDER — POLYETHYLENE GLYCOL 3350 17 G PO PACK
17.0000 g | PACK | Freq: Every day | ORAL | Status: DC
  Administered 2024-06-18: 17 g via ORAL
  Filled 2024-06-18: qty 1

## 2024-06-18 NOTE — Plan of Care (Signed)
" °  Problem: Education: Goal: Knowledge of General Education information will improve Description: Including pain rating scale, medication(s)/side effects and non-pharmacologic comfort measures Outcome: Progressing   Problem: Clinical Measurements: Goal: Ability to maintain clinical measurements within normal limits will improve Outcome: Progressing Goal: Will remain free from infection Outcome: Progressing Goal: Diagnostic test results will improve Outcome: Progressing Goal: Cardiovascular complication will be avoided Outcome: Progressing   Problem: Activity: Goal: Risk for activity intolerance will decrease Outcome: Progressing   Problem: Elimination: Goal: Will not experience complications related to bowel motility Outcome: Progressing   Problem: Pain Managment: Goal: General experience of comfort will improve and/or be controlled Outcome: Progressing   Problem: Safety: Goal: Ability to remain free from injury will improve Outcome: Progressing   Problem: Skin Integrity: Goal: Risk for impaired skin integrity will decrease Outcome: Progressing   Problem: Education: Goal: Knowledge of the prescribed therapeutic regimen will improve Outcome: Progressing   Problem: Bowel/Gastric: Goal: Gastrointestinal status for postoperative course will improve Outcome: Progressing   Problem: Nutritional: Goal: Will attain and maintain optimal nutritional status Outcome: Progressing   "

## 2024-06-18 NOTE — Plan of Care (Signed)

## 2024-06-18 NOTE — Discharge Summary (Signed)
 Physician Discharge Summary  Patient ID: Matthew Alvarez MRN: [id number] DOB/AGE: 12-09-1990 33 y.o.  Admit date: 06/16/2024 Discharge date: 06/18/2024  Admission Diagnoses: symptomatic cholelithiasis  Discharge Diagnoses:  Principal Problem:   Symptomatic cholelithiasis Active Problems:   Elevated blood pressure reading   GERD (gastroesophageal reflux disease)   Discharged Condition: stable  Hospital Course: Patient is a 34 year old male who was admitted with intractable abdominal pain and symptomatic cholelithiasis.  He underwent a robotic assisted laparoscopic cholecystectomy on 06/17/2024.  Postoperatively, his pain was controlled with oral medications, and he was able to tolerate a diet without nausea and vomiting.  He confirms passing flatus.  He is stable for discharge home at this time.  He has been given prescriptions for Tylenol , Colace, oxycodone , Zofran , and MiraLAX .  You have a phone follow-up with me in 2 weeks.  Consults: None  Significant Diagnostic Studies: radiology: Previous ultrasound and CT demonstrating cholelithiasis  Treatments: IV hydration, antibiotics: ceftriaxone  and Cefotan, analgesia: acetaminophen  and oxycodone , and surgery: Robotic assisted laparoscopic cholecystectomy  Discharge Exam: Blood pressure (!) 140/73, pulse 60, temperature 98.3 F (36.8 C), temperature source Oral, resp. rate 19, height 5' 9 (1.753 m), weight 102.1 kg, SpO2 96%. General appearance: alert, cooperative, and no distress GI: Abdomen soft, mild distention, no percussion tenderness, incisional tenderness to palpation; no rigidity, guarding, rebound tenderness; laparoscopic incision sites C/D/I with Dermabond in place  Disposition: Discharge disposition: 01-Home or Self Care       Discharge Instructions     Call MD for:  persistant nausea and vomiting   Complete by: As directed    Call MD for:  redness, tenderness, or signs of infection (pain, swelling, redness, odor or  green/yellow discharge around incision site)   Complete by: As directed    Call MD for:  severe uncontrolled pain   Complete by: As directed    Call MD for:  temperature >100.4   Complete by: As directed    Increase activity slowly   Complete by: As directed       Allergies as of 06/18/2024       Reactions   Clindamycin /lincomycin    Pt reports took this antibiotic in June for dental pain and caused itching to chest   Penicillins Swelling   Has patient had a PCN reaction causing immediate rash, facial/tongue/throat swelling, SOB or lightheadedness with hypotension: Yes Has patient had a PCN reaction causing severe rash involving mucus membranes or skin necrosis: No Has patient had a PCN reaction that required hospitalization No Has patient had a PCN reaction occurring within the last 10 years: No If all of the above answers are NO, then may proceed with Cephalosporin use. Tolerated Cephalosporin Date: 06/16/24, 06/17/24.        Medication List     TAKE these medications    acetaminophen  500 MG tablet Commonly known as: TYLENOL  Take 2 tablets (1,000 mg total) by mouth every 6 (six) hours for 7 days.   docusate sodium  100 MG capsule Commonly known as: Colace Take 1 capsule (100 mg total) by mouth 2 (two) times daily.   famotidine  20 MG tablet Commonly known as: Pepcid  Take 1 tablet (20 mg total) by mouth 2 (two) times daily.   ondansetron  4 MG tablet Commonly known as: Zofran  Take 1 tablet (4 mg total) by mouth daily as needed for nausea or vomiting.   oxyCODONE  5 MG immediate release tablet Commonly known as: Roxicodone  Take 1 tablet (5 mg total) by mouth every 6 (six) hours  as needed.   pantoprazole  40 MG tablet Commonly known as: PROTONIX  Take 1 tablet (40 mg total) by mouth daily. 30 minutes before breakfast   polyethylene glycol 17 g packet Commonly known as: MIRALAX  / GLYCOLAX  Take 17 g by mouth daily.   sucralfate  1 GM/10ML suspension Commonly known as:  Carafate  Take 10 mLs (1 g total) by mouth 4 (four) times daily -  with meals and at bedtime.        Follow-up Information     Akiem Urieta, Dorothyann LABOR, DO. Call.   Specialty: General Surgery Why: I will call you for a phone follow up in 2 weeks Contact information: [address] [place] [facility] [address] [phone]                 Signed: Daelyn Mozer A Marlo Arriola 06/18/2024, 11:46 AM

## 2024-06-18 NOTE — Plan of Care (Signed)
" °  Problem: Education: Goal: Knowledge of General Education information will improve Description: Including pain rating scale, medication(s)/side effects and non-pharmacologic comfort measures 06/18/2024 1210 by Delores Kirsch, RN Outcome: Adequate for Discharge 06/18/2024 1210 by Delores Kirsch, RN Outcome: Progressing   Problem: Health Behavior/Discharge Planning: Goal: Ability to manage health-related needs will improve 06/18/2024 1210 by Delores Kirsch, RN Outcome: Adequate for Discharge 06/18/2024 1210 by Delores Kirsch, RN Outcome: Progressing   Problem: Clinical Measurements: Goal: Ability to maintain clinical measurements within normal limits will improve 06/18/2024 1210 by Delores Kirsch, RN Outcome: Adequate for Discharge 06/18/2024 1210 by Delores Kirsch, RN Outcome: Progressing Goal: Will remain free from infection 06/18/2024 1210 by Delores Kirsch, RN Outcome: Adequate for Discharge 06/18/2024 1210 by Delores Kirsch, RN Outcome: Progressing Goal: Diagnostic test results will improve 06/18/2024 1210 by Delores Kirsch, RN Outcome: Adequate for Discharge 06/18/2024 1210 by Delores Kirsch, RN Outcome: Progressing Goal: Respiratory complications will improve Outcome: Adequate for Discharge Goal: Cardiovascular complication will be avoided Outcome: Adequate for Discharge   Problem: Activity: Goal: Risk for activity intolerance will decrease Outcome: Adequate for Discharge   Problem: Nutrition: Goal: Adequate nutrition will be maintained Outcome: Adequate for Discharge   Problem: Coping: Goal: Level of anxiety will decrease Outcome: Adequate for Discharge   Problem: Elimination: Goal: Will not experience complications related to bowel motility Outcome: Adequate for Discharge Goal: Will not experience complications related to urinary retention Outcome: Adequate for Discharge   Problem: Pain Managment: Goal: General experience of comfort will improve and/or be  controlled Outcome: Adequate for Discharge   Problem: Safety: Goal: Ability to remain free from injury will improve Outcome: Adequate for Discharge   Problem: Skin Integrity: Goal: Risk for impaired skin integrity will decrease Outcome: Adequate for Discharge   Problem: Education: Goal: Knowledge of the prescribed therapeutic regimen will improve Outcome: Adequate for Discharge   Problem: Skin Integrity: Goal: Risk for impaired skin integrity will decrease Outcome: Adequate for Discharge   Problem: Bowel/Gastric: Goal: Gastrointestinal status for postoperative course will improve Outcome: Adequate for Discharge   Problem: Cardiac: Goal: Ability to maintain an adequate cardiac output Outcome: Adequate for Discharge Goal: Will show no evidence of cardiac arrhythmias Outcome: Adequate for Discharge   Problem: Nutritional: Goal: Will attain and maintain optimal nutritional status Outcome: Adequate for Discharge   Problem: Neurological: Goal: Will regain or maintain usual level of consciousness Outcome: Adequate for Discharge   Problem: Clinical Measurements: Goal: Ability to maintain clinical measurements within normal limits Outcome: Adequate for Discharge Goal: Postoperative complications will be avoided or minimized Outcome: Adequate for Discharge   Problem: Respiratory: Goal: Will regain and/or maintain adequate ventilation Outcome: Adequate for Discharge Goal: Respiratory status will improve Outcome: Adequate for Discharge   Problem: Skin Integrity: Goal: Demonstrates signs of wound healing without infection Outcome: Adequate for Discharge   Problem: Urinary Elimination: Goal: Will remain free from infection Outcome: Adequate for Discharge Goal: Ability to achieve and maintain adequate urine output Outcome: Adequate for Discharge   "

## 2024-06-20 ENCOUNTER — Telehealth: Payer: Self-pay | Admitting: *Deleted

## 2024-06-20 ENCOUNTER — Encounter: Payer: Self-pay | Admitting: *Deleted

## 2024-06-20 LAB — SURGICAL PATHOLOGY

## 2024-06-20 NOTE — Telephone Encounter (Signed)
 Surgical Date: 06/17/2024 Procedure: XI ROBOTIC ASSISTED LAPAROSCOPIC CHOLECYSTECTOMY   Received call from patient [phone]~ telephone.   Patient reports that his work has no light duty and requested to extend work note.   Requested to extend x2 weeks, but will return early if he feels  up to it.

## 2024-06-21 LAB — CULTURE, BLOOD (ROUTINE X 2)
Culture: NO GROWTH
Culture: NO GROWTH
Special Requests: ADEQUATE
Special Requests: ADEQUATE

## 2024-06-23 ENCOUNTER — Ambulatory Visit: Payer: Self-pay | Admitting: Surgery

## 2024-06-30 ENCOUNTER — Ambulatory Visit (INDEPENDENT_AMBULATORY_CARE_PROVIDER_SITE_OTHER): Payer: Self-pay | Admitting: Surgery

## 2024-06-30 DIAGNOSIS — Z09 Encounter for follow-up examination after completed treatment for conditions other than malignant neoplasm: Secondary | ICD-10-CM

## 2024-06-30 NOTE — Progress Notes (Signed)
 [facility]  I am calling the patient for post operative evaluation. This is not a billable encounter as it is under the global charges for the surgery.  The patient had a robotic assisted laparoscopic cholecystectomy on 1/2. The patient reports that he is doing well. He is tolerating a diet, having good pain control, and having regular Bms.  The incisions are healing well with glue still on.  Advised that he can use antibiotic ointment prior to showering to help get off the remaining flue. The patient has no concerns.   Pathology: A. GALLBLADDER, CHOLECYSTECTOMY:  - Chronic cholecystitis with obstructing cholelithiasis.  - Negative for malignancy.   Will see the patient PRN.   Dorothyann Brittle, DO [facility] [address] [place], [place] [address] [phone] (office)

## 2024-07-03 ENCOUNTER — Other Ambulatory Visit: Payer: Self-pay

## 2024-07-03 ENCOUNTER — Emergency Department (HOSPITAL_COMMUNITY)
Admission: EM | Admit: 2024-07-03 | Discharge: 2024-07-03 | Disposition: A | Discharge status: Home / Self Care | Payer: Self-pay | Attending: Emergency Medicine | Admitting: Emergency Medicine

## 2024-07-03 ENCOUNTER — Encounter (HOSPITAL_COMMUNITY): Payer: Self-pay

## 2024-07-03 DIAGNOSIS — Z5189 Encounter for other specified aftercare: Secondary | ICD-10-CM

## 2024-07-03 DIAGNOSIS — Z4801 Encounter for change or removal of surgical wound dressing: Secondary | ICD-10-CM | POA: Insufficient documentation

## 2024-07-03 DIAGNOSIS — I10 Essential (primary) hypertension: Secondary | ICD-10-CM | POA: Insufficient documentation

## 2024-07-03 NOTE — ED Provider Notes (Signed)
 " [place] EMERGENCY DEPARTMENT AT [facility] Provider Note   CSN: [id number] Arrival date & time: 07/03/24  8057     Patient presents with: Wound Check   Matthew Alvarez is a 34 y.o. male status post laparoscopic cholecystectomy 06/17/24 with Dr. Evonnie who presents for wound check.  Patient was contacted by his surgeon on 1/15 for postoperative evaluation.  Patient had no concerns at that time.  Today patient is requesting to be cleared for full duty for his job tomorrow.  Without any pain.  No fevers or chills.    Wound Check   Past Medical History:  Diagnosis Date   GERD (gastroesophageal reflux disease)    Hypertension    Past Surgical History:  Procedure Laterality Date   ESOPHAGOGASTRODUODENOSCOPY N/A 12/11/2023   Procedure: EGD (ESOPHAGOGASTRODUODENOSCOPY);  Surgeon: Cindie Carlin POUR, DO;  Location: [facility];  Service: Endoscopy;  Laterality: N/A;  230pm, asa 2   TONSILLECTOMY AND ADENOIDECTOMY          Prior to Admission medications  Medication Sig Start Date End Date Taking? Authorizing Provider  docusate sodium  (COLACE) 100 MG capsule Take 1 capsule (100 mg total) by mouth 2 (two) times daily. 06/17/24 06/17/25  Pappayliou, Dorothyann A, DO  famotidine  (PEPCID ) 20 MG tablet Take 1 tablet (20 mg total) by mouth 2 (two) times daily. Patient not taking: Reported on 06/16/2024 05/24/24   Mesner, Selinda, MD  ondansetron  (ZOFRAN ) 4 MG tablet Take 1 tablet (4 mg total) by mouth daily as needed for nausea or vomiting. 06/17/24 06/17/25  Pappayliou, Dorothyann A, DO  oxyCODONE  (ROXICODONE ) 5 MG immediate release tablet Take 1 tablet (5 mg total) by mouth every 6 (six) hours as needed. 06/17/24   Pappayliou, Dorothyann A, DO  pantoprazole  (PROTONIX ) 40 MG tablet Take 1 tablet (40 mg total) by mouth daily. 30 minutes before breakfast 12/02/23   Shirlean Therisa ORN, NP  polyethylene glycol (MIRALAX  / GLYCOLAX ) 17 g packet Take 17 g by mouth daily. 06/18/24   Pappayliou, Dorothyann A,  DO  sucralfate  (CARAFATE ) 1 GM/10ML suspension Take 10 mLs (1 g total) by mouth 4 (four) times daily -  with meals and at bedtime. 05/24/24   Mesner, Selinda, MD    Allergies: Clindamycin /lincomycin and Penicillins    Review of Systems  Updated Vital Signs BP (!) 142/81   Pulse 75   Temp 97.7 F (36.5 C) (Temporal)   Resp 18   Ht 5' 9 (1.753 m)   Wt 102.1 kg   SpO2 98%   BMI 33.23 kg/m   Physical Exam Vitals and nursing note reviewed.  Constitutional:      General: He is not in acute distress.    Appearance: He is well-developed.  HENT:     Head: Normocephalic and atraumatic.  Eyes:     Conjunctiva/sclera: Conjunctivae normal.  Pulmonary:     Effort: Pulmonary effort is normal. No respiratory distress.     Breath sounds: Normal breath sounds.  Abdominal:     Palpations: Abdomen is soft.     Tenderness: There is no abdominal tenderness.     Comments: Incision sites with routine healing, no evidence of dehiscence, erythema, warmth.  No tenderness, soft nondistended  Musculoskeletal:        General: No swelling.     Cervical back: Neck supple.  Skin:    General: Skin is warm and dry.     Capillary Refill: Capillary refill takes less than 2 seconds.  Neurological:  Mental Status: He is alert.  Psychiatric:        Mood and Affect: Mood normal.     (all labs ordered are listed, but only abnormal results are displayed) Labs Reviewed - No data to display  EKG: None  Radiology: No results found.   Procedures   Medications Ordered in the ED - No data to display  Clinical Course as of 07/03/24 2004  Sun Jul 03, 2024  2002 Patient is status post cholecystectomy 06/17/24 who presents requesting full clearance to return to his job tomorrow.  He is without any complaints.  Upon arrival he is hemodynamically stable and nontoxic-appearing his exam is benign.  I have explained to the patient that his sites appear to be healing well without any evidence of infection  however it is up to the surgeon to clear him on how much he is able to lift.  Encouraged patient to follow-up with his surgeon or refer to his postoperative paperwork for further instructions.  Patient is understanding and agreeable to plan. [JT]    Clinical Course User Index [JT] Donnajean Lynwood DEL, PA-C                                 Medical Decision Making  This patient presents to the ED with chief complaint(s) of Wound check .  The complaint involves an extensive differential diagnosis and also carries with it a high risk of complications and morbidity.   Pertinent past medical history as listed in HPI  The differential diagnosis includes  Cellulitis, abscess, sepsis, infection Additional history obtained: Records reviewed Care Everywhere/External Records  Disposition:   Patient will be discharged home. The patient has been appropriately medically screened and/or stabilized in the ED. I have low suspicion for any other emergent medical condition which would require further screening, evaluation or treatment in the ED or require inpatient management. At time of discharge the patient is hemodynamically stable and in no acute distress. I have discussed work-up results and diagnosis with patient and answered all questions. Patient is agreeable with discharge plan. We discussed strict return precautions for returning to the emergency department and they verbalized understanding.     Social Determinants of Health:   none  This note was dictated with voice recognition software.  Despite best efforts at proofreading, errors may have occurred which can change the documentation meaning.       Final diagnoses:  Visit for wound check    ED Discharge Orders     None          Donnajean Lynwood DEL DEVONNA 07/03/24 2004  "

## 2024-07-03 NOTE — ED Triage Notes (Addendum)
 Pt to ED with request of wound check, pt has healing surgical incision sites that he wants to be checked prior to his return to work- no signs of infection, scabbing noted. No pain or problems reported. Pt says he needs a noted saying he was checked out and can return to work.

## 2024-07-03 NOTE — Discharge Instructions (Signed)
 Your incisions appear to be healing well.  Please refer to your postoperative paperwork or contact your surgeon for postoperative restrictions.  If you experience any new or worsening symptoms you may return to emergency room.

## 2024-07-06 ENCOUNTER — Encounter (HOSPITAL_COMMUNITY): Payer: Self-pay

## 2024-07-06 ENCOUNTER — Emergency Department (HOSPITAL_COMMUNITY)
Admission: EM | Admit: 2024-07-06 | Discharge: 2024-07-06 | Disposition: A | Discharge status: Home / Self Care | Payer: Self-pay | Attending: Emergency Medicine | Admitting: Emergency Medicine

## 2024-07-06 DIAGNOSIS — H6691 Otitis media, unspecified, right ear: Secondary | ICD-10-CM | POA: Insufficient documentation

## 2024-07-06 DIAGNOSIS — H6690 Otitis media, unspecified, unspecified ear: Secondary | ICD-10-CM

## 2024-07-06 DIAGNOSIS — I10 Essential (primary) hypertension: Secondary | ICD-10-CM | POA: Insufficient documentation

## 2024-07-06 MED ORDER — CEFDINIR 300 MG PO CAPS: 300.0000 mg | ORAL_CAPSULE | Freq: Two times a day (BID) | ORAL | 0 refills | Status: AC

## 2024-07-06 NOTE — Discharge Instructions (Signed)
 You have been prescribed antibiotic for right ear infection.  You may take Tylenol  and/or ibuprofen  if needed for pain and/or fever.  Take the antibiotic as directed until finished.  Avoid using earbuds or Q-tips in your ear.  I have listed to the primary care clinics that are local that you may contact one of them to establish primary care.  Return to emergency department for any new or worsening symptoms.

## 2024-07-06 NOTE — ED Provider Notes (Signed)
 " [place] EMERGENCY DEPARTMENT AT [facility] Provider Note   CSN: [id number] Arrival date & time: 07/06/24  9141     Patient presents with: Otalgia   Matthew Alvarez is a 34 y.o. male.    Otalgia      Matthew Alvarez is a 34 y.o. male history of GERD and hypertension who presents to the Emergency Department complaining of right ear pain for 3 days.  Describes throbbing pain to his ear with sensitivity to cold air.  Denies any fever chills headache or dizziness.  No decreased hearing.  Has tried ibuprofen  and sinus medication without relief.  States he does not feel sick and he denies any sore throat, sinus drainage nasal congestion fever or chills.  No known trauma to his ear  Prior to Admission medications  Medication Sig Start Date End Date Taking? Authorizing Provider  docusate sodium  (COLACE) 100 MG capsule Take 1 capsule (100 mg total) by mouth 2 (two) times daily. 06/17/24 06/17/25  Pappayliou, Dorothyann A, DO  famotidine  (PEPCID ) 20 MG tablet Take 1 tablet (20 mg total) by mouth 2 (two) times daily. Patient not taking: Reported on 06/16/2024 05/24/24   Mesner, Selinda, MD  ondansetron  (ZOFRAN ) 4 MG tablet Take 1 tablet (4 mg total) by mouth daily as needed for nausea or vomiting. 06/17/24 06/17/25  Pappayliou, Dorothyann A, DO  oxyCODONE  (ROXICODONE ) 5 MG immediate release tablet Take 1 tablet (5 mg total) by mouth every 6 (six) hours as needed. 06/17/24   Pappayliou, Dorothyann A, DO  pantoprazole  (PROTONIX ) 40 MG tablet Take 1 tablet (40 mg total) by mouth daily. 30 minutes before breakfast 12/02/23   Shirlean Therisa ORN, NP  polyethylene glycol (MIRALAX  / GLYCOLAX ) 17 g packet Take 17 g by mouth daily. 06/18/24   Pappayliou, Dorothyann A, DO  sucralfate  (CARAFATE ) 1 GM/10ML suspension Take 10 mLs (1 g total) by mouth 4 (four) times daily -  with meals and at bedtime. 05/24/24   Mesner, Selinda, MD    Allergies: Clindamycin /lincomycin and Penicillins    Review of Systems  HENT:  Positive  for ear pain.   All other systems reviewed and are negative.   Updated Vital Signs BP 133/80 (BP Location: Right Arm)   Pulse (!) 59   Temp 98.5 F (36.9 C) (Oral)   Resp 16   Ht 5' 9 (1.753 m)   Wt 102.1 kg   SpO2 97%   BMI 33.23 kg/m   Physical Exam Vitals and nursing note reviewed.  Constitutional:      General: He is not in acute distress.    Appearance: Normal appearance. He is not toxic-appearing.  HENT:     Ears:     Comments: Some erythema to the right TM.  Ear canal also appears irritated.  No edema, no drainage or bulging of the TM.  Some air-fluid levels of the middle ear.  No mastoid tenderness.    Nose: No rhinorrhea.     Mouth/Throat:     Mouth: Mucous membranes are moist.     Pharynx: Oropharynx is clear. No oropharyngeal exudate or posterior oropharyngeal erythema.  Cardiovascular:     Rate and Rhythm: Normal rate and regular rhythm.     Pulses: Normal pulses.  Pulmonary:     Effort: Pulmonary effort is normal.  Musculoskeletal:        General: Normal range of motion.  Skin:    General: Skin is warm.     Capillary Refill: Capillary refill takes  less than 2 seconds.  Neurological:     General: No focal deficit present.     Mental Status: He is alert.     Sensory: No sensory deficit.     Motor: No weakness.     (all labs ordered are listed, but only abnormal results are displayed) Labs Reviewed - No data to display  EKG: None  Radiology: No results found.   Procedures   Medications Ordered in the ED - No data to display                                  Medical Decision Making   Patient here with right ear pain x 3 days.  No known injury or decreased hearing.  Denies any other URI symptoms.  No reported fever.  Patient is otherwise well-appearing nontoxic.  On exam he does have some air-fluid levels of the middle ear with some erythema of the right TM and ear canal.  There is no bulging of the canal and no drainage.  I feel the patient  is appropriate for discharge home.  Will start him on antibiotic and he is agreeable to close outpatient follow-up if needed.        Final diagnoses:  Acute otitis media, unspecified otitis media type    ED Discharge Orders     None          Herlinda Milling, PA-C 07/06/24 9050    Yolande Lamar BROCKS, MD 07/06/24 2029  "

## 2024-07-06 NOTE — ED Triage Notes (Signed)
 Pt c/o R ear pain x2-3 days.  Pain score 8/10.  Denies drainage.
